# Patient Record
Sex: Female | Born: 1935 | Race: White | Hispanic: No | Smoking: Never smoker
Health system: Southern US, Community
[De-identification: ages and names within clinical notes are randomized; demographics above are authoritative.]

## PROBLEM LIST (undated history)

## (undated) DIAGNOSIS — M1990 Unspecified osteoarthritis, unspecified site: Secondary | ICD-10-CM

## (undated) DIAGNOSIS — L719 Rosacea, unspecified: Secondary | ICD-10-CM

## (undated) DIAGNOSIS — I509 Heart failure, unspecified: Secondary | ICD-10-CM

## (undated) DIAGNOSIS — F329 Major depressive disorder, single episode, unspecified: Secondary | ICD-10-CM

## (undated) DIAGNOSIS — C4491 Basal cell carcinoma of skin, unspecified: Secondary | ICD-10-CM

## (undated) DIAGNOSIS — M4802 Spinal stenosis, cervical region: Secondary | ICD-10-CM

## (undated) DIAGNOSIS — F29 Unspecified psychosis not due to a substance or known physiological condition: Secondary | ICD-10-CM

## (undated) DIAGNOSIS — I4891 Unspecified atrial fibrillation: Secondary | ICD-10-CM

## (undated) DIAGNOSIS — F32A Depression, unspecified: Secondary | ICD-10-CM

## (undated) DIAGNOSIS — I10 Essential (primary) hypertension: Secondary | ICD-10-CM

## (undated) HISTORY — DX: Major depressive disorder, single episode, unspecified: F32.9

## (undated) HISTORY — DX: Rosacea, unspecified: L71.9

## (undated) HISTORY — DX: Basal cell carcinoma of skin, unspecified: C44.91

## (undated) HISTORY — DX: Essential (primary) hypertension: I10

## (undated) HISTORY — PX: CATARACT EXTRACTION: SUR2

## (undated) HISTORY — DX: Depression, unspecified: F32.A

## (undated) HISTORY — DX: Unspecified osteoarthritis, unspecified site: M19.90

## (undated) HISTORY — PX: ROTATOR CUFF REPAIR: SHX139

## (undated) HISTORY — DX: Spinal stenosis, cervical region: M48.02

## (undated) HISTORY — PX: APPENDECTOMY: SHX54

---

## 1969-08-27 HISTORY — PX: DILATION AND CURETTAGE OF UTERUS: SHX78

## 1989-08-27 DIAGNOSIS — C4491 Basal cell carcinoma of skin, unspecified: Secondary | ICD-10-CM

## 1989-08-27 HISTORY — DX: Basal cell carcinoma of skin, unspecified: C44.91

## 1994-12-27 HISTORY — PX: KNEE ARTHROSCOPY: SUR90

## 2001-01-27 ENCOUNTER — Encounter: Payer: Self-pay | Admitting: Internal Medicine

## 2001-01-27 LAB — CONVERTED CEMR LAB

## 2002-05-07 ENCOUNTER — Encounter: Payer: Self-pay | Admitting: Internal Medicine

## 2004-12-15 ENCOUNTER — Ambulatory Visit: Payer: Self-pay | Admitting: Internal Medicine

## 2004-12-23 ENCOUNTER — Ambulatory Visit: Payer: Self-pay | Admitting: General Practice

## 2005-01-20 ENCOUNTER — Ambulatory Visit: Payer: Self-pay | Admitting: Internal Medicine

## 2005-05-13 ENCOUNTER — Ambulatory Visit: Payer: Self-pay | Admitting: Chiropractic Medicine

## 2005-06-02 ENCOUNTER — Ambulatory Visit: Payer: Self-pay | Admitting: Internal Medicine

## 2005-07-09 ENCOUNTER — Ambulatory Visit: Payer: Self-pay | Admitting: Internal Medicine

## 2005-10-11 ENCOUNTER — Ambulatory Visit: Payer: Self-pay | Admitting: Internal Medicine

## 2005-12-07 ENCOUNTER — Ambulatory Visit: Payer: Self-pay | Admitting: Chiropractic Medicine

## 2006-01-20 ENCOUNTER — Ambulatory Visit: Payer: Self-pay | Admitting: Internal Medicine

## 2006-02-16 ENCOUNTER — Encounter: Payer: Self-pay | Admitting: Internal Medicine

## 2006-04-06 ENCOUNTER — Ambulatory Visit: Payer: Self-pay | Admitting: Internal Medicine

## 2006-07-25 ENCOUNTER — Ambulatory Visit: Payer: Self-pay | Admitting: Internal Medicine

## 2006-09-15 ENCOUNTER — Ambulatory Visit: Payer: Self-pay | Admitting: Internal Medicine

## 2006-09-15 ENCOUNTER — Emergency Department: Payer: Self-pay | Admitting: Emergency Medicine

## 2006-09-22 ENCOUNTER — Emergency Department: Payer: Self-pay | Admitting: Internal Medicine

## 2006-11-10 ENCOUNTER — Encounter: Payer: Self-pay | Admitting: Internal Medicine

## 2007-01-26 ENCOUNTER — Ambulatory Visit: Payer: Self-pay | Admitting: Internal Medicine

## 2007-01-26 LAB — CONVERTED CEMR LAB
Albumin: 3.9 g/dL (ref 3.5–5.2)
BUN: 11 mg/dL (ref 6–23)
Calcium: 9.3 mg/dL (ref 8.4–10.5)
Chloride: 99 meq/L (ref 96–112)
GFR calc Af Amer: 105 mL/min
GFR calc non Af Amer: 87 mL/min
Potassium: 3.6 meq/L (ref 3.5–5.1)

## 2007-07-18 ENCOUNTER — Encounter: Payer: Self-pay | Admitting: Internal Medicine

## 2007-07-20 ENCOUNTER — Encounter: Payer: Self-pay | Admitting: Internal Medicine

## 2007-07-20 DIAGNOSIS — M4802 Spinal stenosis, cervical region: Secondary | ICD-10-CM

## 2007-07-20 DIAGNOSIS — R32 Unspecified urinary incontinence: Secondary | ICD-10-CM

## 2007-07-20 DIAGNOSIS — I10 Essential (primary) hypertension: Secondary | ICD-10-CM | POA: Insufficient documentation

## 2007-07-20 DIAGNOSIS — F329 Major depressive disorder, single episode, unspecified: Secondary | ICD-10-CM

## 2007-07-20 DIAGNOSIS — F3289 Other specified depressive episodes: Secondary | ICD-10-CM | POA: Insufficient documentation

## 2007-07-20 DIAGNOSIS — L719 Rosacea, unspecified: Secondary | ICD-10-CM | POA: Insufficient documentation

## 2007-07-27 ENCOUNTER — Ambulatory Visit: Payer: Self-pay | Admitting: Internal Medicine

## 2007-08-30 ENCOUNTER — Encounter: Payer: Self-pay | Admitting: Internal Medicine

## 2007-09-28 ENCOUNTER — Ambulatory Visit: Payer: Self-pay | Admitting: Internal Medicine

## 2007-09-28 ENCOUNTER — Encounter (INDEPENDENT_AMBULATORY_CARE_PROVIDER_SITE_OTHER): Payer: Self-pay | Admitting: *Deleted

## 2007-09-28 DIAGNOSIS — M79609 Pain in unspecified limb: Secondary | ICD-10-CM

## 2007-10-05 ENCOUNTER — Encounter: Payer: Self-pay | Admitting: Internal Medicine

## 2008-01-04 ENCOUNTER — Telehealth: Payer: Self-pay | Admitting: Internal Medicine

## 2008-01-29 ENCOUNTER — Telehealth (INDEPENDENT_AMBULATORY_CARE_PROVIDER_SITE_OTHER): Payer: Self-pay | Admitting: *Deleted

## 2008-03-04 ENCOUNTER — Encounter: Payer: Self-pay | Admitting: Internal Medicine

## 2008-03-12 ENCOUNTER — Telehealth (INDEPENDENT_AMBULATORY_CARE_PROVIDER_SITE_OTHER): Payer: Self-pay | Admitting: *Deleted

## 2008-03-19 ENCOUNTER — Telehealth (INDEPENDENT_AMBULATORY_CARE_PROVIDER_SITE_OTHER): Payer: Self-pay | Admitting: *Deleted

## 2008-06-20 ENCOUNTER — Encounter: Payer: Self-pay | Admitting: Internal Medicine

## 2008-07-01 ENCOUNTER — Encounter: Payer: Self-pay | Admitting: Internal Medicine

## 2008-07-04 ENCOUNTER — Ambulatory Visit: Payer: Self-pay | Admitting: Internal Medicine

## 2008-07-04 DIAGNOSIS — M1990 Unspecified osteoarthritis, unspecified site: Secondary | ICD-10-CM | POA: Insufficient documentation

## 2008-07-08 LAB — CONVERTED CEMR LAB
BUN: 12 mg/dL (ref 6–23)
Basophils Relative: 2.7 % — ABNORMAL HIGH (ref 0.0–1.0)
CO2: 29 meq/L (ref 19–32)
Chloride: 100 meq/L (ref 96–112)
Creatinine, Ser: 0.8 mg/dL (ref 0.4–1.2)
Eosinophils Absolute: 0.2 10*3/uL (ref 0.0–0.7)
Eosinophils Relative: 2.5 % (ref 0.0–5.0)
GFR calc Af Amer: 90 mL/min
Lymphocytes Relative: 23.1 % (ref 12.0–46.0)
MCV: 88.9 fL (ref 78.0–100.0)
Neutrophils Relative %: 68.5 % (ref 43.0–77.0)
Phosphorus: 4.5 mg/dL (ref 2.3–4.6)
Platelets: 323 10*3/uL (ref 150–400)
Potassium: 3.9 meq/L (ref 3.5–5.1)
RBC: 4.27 M/uL (ref 3.87–5.11)
TSH: 2.42 microintl units/mL (ref 0.35–5.50)
WBC: 9.8 10*3/uL (ref 4.5–10.5)

## 2008-09-17 ENCOUNTER — Telehealth: Payer: Self-pay | Admitting: Internal Medicine

## 2008-10-24 ENCOUNTER — Telehealth: Payer: Self-pay | Admitting: Internal Medicine

## 2008-10-30 ENCOUNTER — Telehealth: Payer: Self-pay | Admitting: Internal Medicine

## 2008-11-07 ENCOUNTER — Ambulatory Visit: Payer: Self-pay | Admitting: Internal Medicine

## 2009-02-19 ENCOUNTER — Telehealth: Payer: Self-pay | Admitting: Internal Medicine

## 2009-03-24 ENCOUNTER — Encounter: Payer: Self-pay | Admitting: Internal Medicine

## 2009-05-08 ENCOUNTER — Ambulatory Visit: Payer: Self-pay | Admitting: Internal Medicine

## 2009-05-13 LAB — CONVERTED CEMR LAB
ALT: 19 units/L (ref 0–35)
AST: 24 units/L (ref 0–37)
Alkaline Phosphatase: 71 units/L (ref 39–117)
Basophils Relative: 0.2 % (ref 0.0–3.0)
Bilirubin, Direct: 0.2 mg/dL (ref 0.0–0.3)
CO2: 30 meq/L (ref 19–32)
Creatinine, Ser: 0.8 mg/dL (ref 0.4–1.2)
Eosinophils Relative: 2.4 % (ref 0.0–5.0)
Glucose, Bld: 77 mg/dL (ref 70–99)
Hemoglobin: 12.6 g/dL (ref 12.0–15.0)
Lymphocytes Relative: 23.6 % (ref 12.0–46.0)
Neutro Abs: 6.3 10*3/uL (ref 1.4–7.7)
Neutrophils Relative %: 72.1 % (ref 43.0–77.0)
Potassium: 3.9 meq/L (ref 3.5–5.1)
RBC: 4.15 M/uL (ref 3.87–5.11)
Sodium: 136 meq/L (ref 135–145)
Total Protein: 6.5 g/dL (ref 6.0–8.3)
WBC: 8.7 10*3/uL (ref 4.5–10.5)

## 2009-07-29 ENCOUNTER — Telehealth: Payer: Self-pay | Admitting: Internal Medicine

## 2009-09-02 ENCOUNTER — Telehealth: Payer: Self-pay | Admitting: Internal Medicine

## 2009-11-13 ENCOUNTER — Telehealth: Payer: Self-pay | Admitting: Internal Medicine

## 2009-11-17 ENCOUNTER — Encounter: Payer: Self-pay | Admitting: Internal Medicine

## 2010-01-09 ENCOUNTER — Telehealth: Payer: Self-pay | Admitting: Internal Medicine

## 2010-02-12 ENCOUNTER — Encounter: Payer: Self-pay | Admitting: Internal Medicine

## 2010-02-26 ENCOUNTER — Ambulatory Visit: Payer: Self-pay | Admitting: Internal Medicine

## 2010-03-04 LAB — CONVERTED CEMR LAB
ALT: 17 units/L (ref 0–35)
AST: 24 units/L (ref 0–37)
Alkaline Phosphatase: 68 units/L (ref 39–117)
Basophils Absolute: 0.1 10*3/uL (ref 0.0–0.1)
Eosinophils Relative: 3.3 % (ref 0.0–5.0)
GFR calc non Af Amer: 102.8 mL/min (ref >60)
HCT: 36.2 % (ref 36.0–46.0)
Hemoglobin: 12 g/dL (ref 12.0–15.0)
Lymphocytes Relative: 25.2 % (ref 12.0–46.0)
Lymphs Abs: 2 10*3/uL (ref 0.7–4.0)
Monocytes Relative: 5.2 % (ref 3.0–12.0)
Neutro Abs: 5.2 10*3/uL (ref 1.4–7.7)
Phosphorus: 4 mg/dL (ref 2.3–4.6)
Platelets: 257 10*3/uL (ref 150.0–400.0)
Potassium: 3.9 meq/L (ref 3.5–5.1)
Total Bilirubin: 0.7 mg/dL (ref 0.3–1.2)
WBC: 8 10*3/uL (ref 4.5–10.5)

## 2010-07-20 ENCOUNTER — Ambulatory Visit: Payer: Self-pay | Admitting: Ophthalmology

## 2010-07-27 HISTORY — PX: CATARACT EXTRACTION: SUR2

## 2010-08-13 ENCOUNTER — Ambulatory Visit: Payer: Self-pay | Admitting: Internal Medicine

## 2010-08-28 ENCOUNTER — Telehealth: Payer: Self-pay | Admitting: Internal Medicine

## 2010-09-29 ENCOUNTER — Telehealth: Payer: Self-pay | Admitting: Internal Medicine

## 2010-09-30 ENCOUNTER — Encounter: Payer: Self-pay | Admitting: Internal Medicine

## 2010-10-21 ENCOUNTER — Telehealth: Payer: Self-pay | Admitting: Family Medicine

## 2010-11-09 ENCOUNTER — Encounter: Payer: Self-pay | Admitting: Internal Medicine

## 2011-01-26 NOTE — Progress Notes (Signed)
Summary: Rx Wellbutrin, Alprazolam  Phone Note Refill Request Call back at [phone] Message from:  [facility] on January 09, 2010 11:01 AM  Refills Requested: Medication #1:  WELLBUTRIN SR 150 MG  TB12 Take one by mouth two times a day   Last Refilled: 10/30/2009  Medication #2:  XANAX 0.25 MG  TABS Take one tablet by mouth twice daily as needed   Last Refilled: 11/03/2009 Received faxed refill request   Method Requested: Telephone to Pharmacy Initial call taken by: Linde Gillis CMA Duncan Dull),  January 09, 2010 11:01 AM  Follow-up for Phone Call        okay wellbutrin for 1 year  xanax #60 x 0 Follow-up by: Cindee Salt MD,  January 09, 2010 1:28 PM  Additional Follow-up for Phone Call Additional follow up Details #1::        Rx called to pharmacy, Rx faxed to pharmacy Additional Follow-up by: DeShannon Katrinka Blazing CMA Duncan Dull),  January 09, 2010 6:10 PM    Prescriptions: XANAX 0.25 MG  TABS (ALPRAZOLAM) Take one tablet by mouth twice daily as needed  #60 x 0   Entered by:   Mervin Hack CMA (AAMA)   Authorized by:   Cindee Salt MD   Signed by:   Mervin Hack CMA (AAMA) on 01/09/2010   Method used:   Telephoned to ...       [facility] [phone]* (retail)       [address] [place], [place]  [address]       Ph: [phone]       Fax: [phone]   RxID:   [id number] [employer] SR 150 MG  TB12 (BUPROPION HCL) Take one by mouth two times a day  #60 x 12   Entered by:   Mervin Hack CMA (AAMA)   Authorized by:   Cindee Salt MD   Signed by:   Mervin Hack CMA (AAMA) on 01/09/2010   Method used:   Electronically to        [facility] [phone]* (retail)       [address] [place], [place]  [address]       Ph: [phone]       Fax: [phone]   RxID:   [id number]

## 2011-01-26 NOTE — Progress Notes (Signed)
Summary: vomitting and nausea   Phone Note Call from Patient Call back at [phone]   Caller: Patient Call For: Cindee Salt MD Summary of Call: Patient has been nauseated and throwing up since sunday. The last time she vomitted was last night. She is feeling some better today. She says that she has been resting alot, drinking plenty of fluids (water, gatorade, sprite, and ice chips).  Patient says she is just a little dizzy today with a slight headache. Patient just wanted to let us know and will call back tomorrow if not feeling better.  Initial call taken by: Melody Comas,  October 21, 2010 3:07 PM  Follow-up for Phone Call        noted. follow up as needed.  Follow-up by: Crawford Givens MD,  October 21, 2010 3:26 PM

## 2011-01-26 NOTE — Miscellaneous (Signed)
Summary: Order for Pneumovax & Zostavax/[facility]  Order for Pneumovax & Zostavax/[facility]   Imported By: Lanelle Bal 10/08/2010 10:51:34  _____________________________________________________________________  External Attachment:    Type:   Image     Comment:   External Document

## 2011-01-26 NOTE — Miscellaneous (Signed)
Summary: Pneumococcal  Clinical Lists Changes  Observations: Added new observation of PNEUMOVAX: Historical (11/03/2010 16:22)      Pneumovax Immunization History:    Pneumovax # 1:  Historical (11/03/2010)  lot# [id number] exp 8/12 given at [facility] pharmacy by P.Felicie Morn, RPh

## 2011-01-26 NOTE — Assessment & Plan Note (Signed)
Summary: 6 M F/U DLO   Vital Signs:  Patient profile:   77 year old female Weight:      146 pounds Temp:     98 degrees F Pulse rate:   72 / minute BP sitting:   148 / 70  History of Present Illness: Doing about the same  Still finds the incontinence is "nerve wracking" using oxybutynin at variable dose depending on circumstances does get dry mouth but generally satisfied with this  ONgoing depression  lots of stress---a lot is financial and issues with neighbors, etc  Fell walking dog  ~10 days ago Needed to call rescue to help her up bruised chest and various other spots discussed safety  cataract done a few weeks ago doing well with this  No chest pain  No SOB No dizziness or syncope  Allergies: 1)  ! Penicillin V Potassium (Penicillin V Potassium) 2)  Demerol (Meperidine Hcl) 3)  Codeine Phosphate (Codeine Phosphate)  Past History:  Past medical, surgical, family and social histories (including risk factors) reviewed for relevance to current acute and chronic problems.  Past Medical History: Reviewed history from 07/04/2008 and no changes required. Depression Hypertension Urinary incontinence Basal cell cancer--forehead    1990's Rosacea Cervical spinal stenosis Osteoarthritis  [facility] Dr Lady Gary  [phone] Dr Rosemarie Ax  [phone]  Past Surgical History: Appendectomy 1950's D & C 1970's Cataract OS Right knee arthroscopy 1996 Left torn rotator cuff  Cardiolite negative, nl LV fx 09/00 Dexa- benign 02/07 Right cataract/IOL  8/11  Family History: Reviewed history from 07/20/2007 and no changes required. Father: Died at age 15, CHF, HTN Mother: Died at 75, Lung cancer, HTN  HBP- Mom, Dad No DM Possible dementia  Social History: Reviewed history from 11/07/2008 and no changes required. Widowed twice Now with a gentleman friend again since 6/09 2 adoptedchildren: One living, one deceased Occupation: Retired, Curator  travel agencies Never Smoked Alcohol use-occ  Review of Systems       weight down some generally sleeps okay  Physical Exam  General:  alert and normal appearance.   Neck:  supple, no masses, no thyromegaly, no carotid bruits, and no cervical lymphadenopathy.   Lungs:  normal respiratory effort, no intercostal retractions, no accessory muscle use, and normal breath sounds.   Heart:  normal rate, regular rhythm, no murmur, and no gallop.   Extremities:  trace edema Neurologic:  alert & oriented X3 and strength normal in all extremities.  Walks with cane Psych:  normally interactive, good eye contact, not anxious appearing, and not depressed appearing.     Impression & Recommendations:  Problem # 1:  HYPERTENSION (ICD-401.9) Assessment Unchanged control is fine no changes needed  The following medications were removed from the medication list:    Dyazide 37.5-25 Mg Caps (Triamterene-hctz) .Marland Kitchen... Take one by mouth once a day Her updated medication list for this problem includes:    Verapamil Hcl Cr 180 Mg Cp24 (Verapamil hcl) .Marland Kitchen... Take one by mouth at bedtime  BP today: 148/70 Prior BP: 152/70 (02/26/2010)  Labs Reviewed: K+: 3.9 (02/26/2010) Creat: : 0.6 (02/26/2010)     Problem # 2:  DEPRESSION (ICD-311) Assessment: Unchanged ongoing stress reasonable control so no changes  Her updated medication list for this problem includes:    Wellbutrin Sr 150 Mg Tb12 (Bupropion hcl) .Marland Kitchen... Take one by mouth two times a day    Xanax 0.25 Mg Tabs (Alprazolam) .Marland Kitchen... Take one tablet by mouth twice daily as needed  Problem #  3:  OSTEOARTHRITIS (ICD-715.90) Assessment: Comment Only discussed safety has reasonable pain control--though getting over fall now  Her updated medication list for this problem includes:    Vicodin 5-500 Mg Tabs (Hydrocodone-acetaminophen) .Marland Kitchen... As needed  Problem # 4:  URINARY INCONTINENCE (ICD-788.30) Assessment: Comment Only happier with the  oxybutynin  Complete Medication List: 1)  Wellbutrin Sr 150 Mg Tb12 (Bupropion hcl) .... Take one by mouth two times a day 2)  Verapamil Hcl Cr 180 Mg Cp24 (Verapamil hcl) .... Take one by mouth at bedtime 3)  Xanax 0.25 Mg Tabs (Alprazolam) .... Take one tablet by mouth twice daily as needed 4)  Vicodin 5-500 Mg Tabs (Hydrocodone-acetaminophen) .... As needed 5)  Co Q-10 150 Mg Caps (Coenzyme q10) .... Take 1 by mouth once daily 6)  B-50 Complex Tabs (B complex-folic acid) .... Take 1 by mouth once daily 7)  Oscal 500/200 D-3 500-200 Mg-unit Tabs (Calcium-vitamin d) .... Take 1 by mouth once daily 8)  Yucca 12.1500mg   .... Take 1 tablet by mouth three times a day as needed arthrithis 9)  Metrogel 1 % Gel (Metronidazole) .... Apply two times a day 10)  Oxybutynin Chloride 15 Mg Xr24h-tab (Oxybutynin chloride) .Marland Kitchen.. 1 daily to two times a day for incontinence  Patient Instructions: 1)  Please schedule a follow-up appointment in 6 months .

## 2011-01-26 NOTE — Progress Notes (Signed)
Summary: xanax   Phone Note Refill Request Message from:  Fax from Pharmacy on August 28, 2010 11:42 AM  Refills Requested: Medication #1:  XANAX 0.25 MG  TABS Take one tablet by mouth twice daily as needed   Last Refilled: 05/28/2010 Refill request from [facility]. Fax is on your desk. [phone]  Initial call taken by: Melody Comas,  August 28, 2010 11:42 AM  Follow-up for Phone Call        okay #60 x 1 Follow-up by: Cindee Salt MD,  August 28, 2010 1:32 PM  Additional Follow-up for Phone Call Additional follow up Details #1::        completed refill request was faxed to [phone]  [facility].Lewanda Rife LPN  August 28, 2010 3:15 PM     Prescriptions: XANAX 0.25 MG  TABS (ALPRAZOLAM) Take one tablet by mouth twice daily as needed  #60 x 1   Entered by:   Lewanda Rife LPN   Authorized by:   Cindee Salt MD   Signed by:   Lewanda Rife LPN on 19/14/7829   Method used:   Historical   RxID:   [id number]  Completed refill request form was faxed to [facility] [phone].Lewanda Rife LPN  August 28, 2010 3:17 PM

## 2011-01-26 NOTE — Assessment & Plan Note (Signed)
Summary: FOLLOW UP/[facility]   Vital Signs:  Patient profile:   77 year old female Weight:      160 pounds BMI:     27.56 Temp:     98 degrees F oral Pulse rate:   60 / minute Pulse rhythm:   regular BP sitting:   152 / 70  (left arm) Cuff size:   regular  Vitals Entered By: Mervin Hack CMA Duncan Dull) (February 26, 2010 3:21 PM) CC: follow-up visit   History of Present Illness: Doing okay  Gentleman friend has been to [place] to see his family Visiting and on business Thinks things will be okay upon his return (gone for 6 weeks now) this continues to be a good relationship  dog took off on porch and spun her around but fortunately she didn't fall  Has noted increased urinary symptoms detrol not helping Now out of it and needs to change due to formulary issues  Occ checks BP at [facility] runs in 140's there Occ headaches--she relates to her glasses No chest pain No SOB No sig edema --or occ a trace  Mood has been off worse with boyfriend away for extended area Doesn't feel that med change is needed  some arthritis pain very rarely uses the hydrocodone  Allergies: 1)  ! Penicillin V Potassium (Penicillin V Potassium) 2)  Demerol (Meperidine Hcl) 3)  Codeine Phosphate (Codeine Phosphate)  Past History:  Past medical, surgical, family and social histories (including risk factors) reviewed for relevance to current acute and chronic problems.  Past Medical History: Reviewed history from 07/04/2008 and no changes required. Depression Hypertension Urinary incontinence Basal cell cancer--forehead    1990's Rosacea Cervical spinal stenosis Osteoarthritis  [facility] Dr Lady Gary  [phone] Dr Rosemarie Ax  [phone]  Past Surgical History: Reviewed history from 07/20/2007 and no changes required. Appendectomy 1950's D & C 1970's Cataract OS Right knee arthroscopy 1996 Left torn rotator cuff  Cardiolite negative, nl LV fx 09/00 Dexa- benign 02/07  Family  History: Reviewed history from 07/20/2007 and no changes required. Father: Died at age 67, CHF, HTN Mother: Died at 26, Lung cancer, HTN  HBP- Mom, Dad No DM Possible dementia  Social History: Reviewed history from 11/07/2008 and no changes required. Widowed twice Now with a gentleman friend again since 6/09 2 adoptedchildren: One living, one deceased Occupation: Retired, Curator travel agencies Never Smoked Alcohol use-occ  Review of Systems       appetite is okay weight is stable sleeps okay in general  Physical Exam  General:  alert and normal appearance.   Neck:  supple, no masses, no thyromegaly, no carotid bruits, and no cervical lymphadenopathy.   Lungs:  normal respiratory effort and normal breath sounds.   Heart:  normal rate, regular rhythm, no murmur, and no gallop.   Abdomen:  soft and non-tender.   Msk:  no joint tenderness and no joint swelling.   Some stiffness Neurologic:  needed help getting up on table Walks stably with cane Psych:  normally interactive, good eye contact, not anxious appearing, and not depressed appearing.     Impression & Recommendations:  Problem # 1:  URINARY INCONTINENCE (ICD-788.30) Assessment Deteriorated will change to oxybutynin ER consider vesicare if that doesn't work  Problem # 2:  HYPERTENSION (ICD-401.9) Assessment: Unchanged  reasonable control due for labs  Her updated medication list for this problem includes:    Dyazide 37.5-25 Mg Caps (Triamterene-hctz) .Marland Kitchen... Take one by mouth once a day    Verapamil  Hcl Cr 180 Mg Cp24 (Verapamil hcl) .Marland Kitchen... Take one by mouth at bedtime  BP today: 152/70 Prior BP: 140/70 (05/08/2009)  Labs Reviewed: K+: 3.9 (05/08/2009) Creat: : 0.8 (05/08/2009)     Orders: Venipuncture (21308) TLB-Renal Function Panel (80069-RENAL) TLB-CBC Platelet - w/Differential (85025-CBCD) TLB-Hepatic/Liver Function Pnl (80076-HEPATIC) TLB-TSH (Thyroid Stimulating Hormone)  (84443-TSH)  Problem # 3:  DEPRESSION (ICD-311) Assessment: Unchanged up and down some but generally stable no changes  Her updated medication list for this problem includes:    Wellbutrin Sr 150 Mg Tb12 (Bupropion hcl) .Marland Kitchen... Take one by mouth two times a day    Xanax 0.25 Mg Tabs (Alprazolam) .Marland Kitchen... Take one tablet by mouth twice daily as needed  Problem # 4:  OSTEOARTHRITIS (ICD-715.90) Assessment: Unchanged needs the meds occ tries to stay active  Her updated medication list for this problem includes:    Vicodin 5-500 Mg Tabs (Hydrocodone-acetaminophen) .Marland Kitchen... As needed  Complete Medication List: 1)  Dyazide 37.5-25 Mg Caps (Triamterene-hctz) .... Take one by mouth once a day 2)  Wellbutrin Sr 150 Mg Tb12 (Bupropion hcl) .... Take one by mouth two times a day 3)  Verapamil Hcl Cr 180 Mg Cp24 (Verapamil hcl) .... Take one by mouth at bedtime 4)  Detrol La 4 Mg Cp24 (Tolterodine tartrate) .... Take one by mouth once a day 5)  Xanax 0.25 Mg Tabs (Alprazolam) .... Take one tablet by mouth twice daily as needed 6)  Vicodin 5-500 Mg Tabs (Hydrocodone-acetaminophen) .... As needed 7)  Co Q-10 150 Mg Caps (Coenzyme q10) .... Take 1 by mouth once daily 8)  B-50 Complex Tabs (B complex-folic acid) .... Take 1 by mouth once daily 9)  Oscal 500/200 D-3 500-200 Mg-unit Tabs (Calcium-vitamin d) .... Take 1 by mouth once daily 10)  Yucca 12.1500mg   .... Take 1 tablet by mouth three times a day as needed arthrithis 11)  Metrogel 1 % Gel (Metronidazole) .... Apply two times a day 12)  Oxybutynin Chloride 10 Mg Xr24h-tab (Oxybutynin chloride) .Marland Kitchen.. 1 tab daily for bladder problems  Patient Instructions: 1)  Please schedule a follow-up appointment in 6 months .  Prescriptions: XANAX 0.25 MG  TABS (ALPRAZOLAM) Take one tablet by mouth twice daily as needed  #60 x 1   Entered and Authorized by:   Cindee Salt MD   Signed by:   Cindee Salt MD on 02/26/2010   Method used:   Print then  Give to Patient   RxID:   [id number] OXYBUTYNIN CHLORIDE 10 MG XR24H-TAB (OXYBUTYNIN CHLORIDE) 1 tab daily for bladder problems  #30 x 12   Entered and Authorized by:   Cindee Salt MD   Signed by:   Cindee Salt MD on 02/26/2010   Method used:   Electronically to        [facility] [phone]* (retail)       [address] [place], [place]  [address]       Ph: [phone]       Fax: [phone]   RxID:   [id number]   Current Allergies (reviewed today): ! PENICILLIN V POTASSIUM (PENICILLIN V POTASSIUM) DEMEROL (MEPERIDINE HCL) CODEINE PHOSPHATE (CODEINE PHOSPHATE)

## 2011-01-26 NOTE — Progress Notes (Signed)
Summary: order form for pneumovax  Phone Note From Pharmacy   Caller: [facility] Summary of Call: Order form to give  pneumovax is on your desk.  Initial call taken by: Lowella Petties CMA,  September 29, 2010 4:58 PM  Follow-up for Phone Call        okay to give pneumovax May offer zostavax as well Follow-up by: Cindee Salt MD,  September 30, 2010 7:48 AM  Additional Follow-up for Phone Call Additional follow up Details #1::        order faxed back to [facility] and scanned Additional Follow-up by: DeShannon Katrinka Blazing CMA Duncan Dull),  September 30, 2010 8:43 AM

## 2011-01-26 NOTE — Medication Information (Signed)
Summary: Formulary Letter Regarding Detrol/[employer]  Formulary Letter Regarding Detrol/[employer]   Imported By: Lanelle Bal 02/20/2010 10:48:11  _____________________________________________________________________  External Attachment:    Type:   Image     Comment:   External Document

## 2011-02-08 ENCOUNTER — Telehealth: Payer: Self-pay | Admitting: Internal Medicine

## 2011-02-17 NOTE — Progress Notes (Signed)
SummaryPrudy Barker  Phone Note Refill Request Message from:  [facility]  on February 08, 2011 5:03 PM  Refills Requested: Medication #1:  XANAX 0.25 MG  TABS Take one tablet by mouth twice daily as needed   Last Refilled: 11/25/2010 Form on your desk    Method Requested: Fax to Local Pharmacy Initial call taken by: Mervin Hack CMA Duncan Dull),  February 08, 2011 5:04 PM  Follow-up for Phone Call        okay #60 x 0  Needs to set up follow up appt Follow-up by: Cindee Salt MD,  February 09, 2011 1:31 PM  Additional Follow-up for Phone Call Additional follow up Details #1::        Rx faxed to pharmacy, Spoke with patient and advised results. OV appt scheduled Additional Follow-up by: Mervin Hack CMA Duncan Dull),  February 09, 2011 2:22 PM    Prescriptions: XANAX 0.25 MG  TABS (ALPRAZOLAM) Take one tablet by mouth twice daily as needed  #60 x 0   Entered by:   Mervin Hack CMA (AAMA)   Authorized by:   Cindee Salt MD   Signed by:   Mervin Hack CMA (AAMA) on 02/09/2011   Method used:   Handwritten   RxID:   [id number]

## 2011-03-08 ENCOUNTER — Ambulatory Visit: Payer: Self-pay | Admitting: Internal Medicine

## 2011-04-07 ENCOUNTER — Other Ambulatory Visit: Payer: Self-pay | Admitting: *Deleted

## 2011-04-07 MED ORDER — BUPROPION HCL ER (SR) 150 MG PO TB12: 150.0000 mg | ORAL_TABLET | Freq: Two times a day (BID) | ORAL | Status: DC

## 2011-04-09 ENCOUNTER — Encounter: Payer: Self-pay | Admitting: Internal Medicine

## 2011-04-12 ENCOUNTER — Ambulatory Visit: Payer: Self-pay | Admitting: Internal Medicine

## 2011-04-29 ENCOUNTER — Other Ambulatory Visit: Payer: Self-pay | Admitting: *Deleted

## 2011-04-29 ENCOUNTER — Ambulatory Visit (INDEPENDENT_AMBULATORY_CARE_PROVIDER_SITE_OTHER): Payer: PRIVATE HEALTH INSURANCE | Admitting: Internal Medicine

## 2011-04-29 ENCOUNTER — Encounter: Payer: Self-pay | Admitting: Internal Medicine

## 2011-04-29 VITALS — BP 138/78 | HR 102 | Temp 98.6°F | Ht 64.0 in | Wt 141.0 lb

## 2011-04-29 DIAGNOSIS — F329 Major depressive disorder, single episode, unspecified: Secondary | ICD-10-CM

## 2011-04-29 DIAGNOSIS — R32 Unspecified urinary incontinence: Secondary | ICD-10-CM

## 2011-04-29 DIAGNOSIS — M4802 Spinal stenosis, cervical region: Secondary | ICD-10-CM

## 2011-04-29 DIAGNOSIS — I10 Essential (primary) hypertension: Secondary | ICD-10-CM

## 2011-04-29 NOTE — Telephone Encounter (Signed)
Fax is on your desk . 

## 2011-04-29 NOTE — Progress Notes (Signed)
Subjective:    Patient ID: Heather Barker, female    DOB: 04-18-1930, 77 y.o.   MRN: [id number]  HPI Has had more trouble walking around--esp in evening Uses cane regularly--at least when out Has been pulled down by dog another time---no major injury  Still with depression Overwhelmed at times Money problems, etc Has not had persistent problems---phone conversations with friends often help, etc  Ongoing intermittent arthritis pain Tries to use the hydrocodone sparingly  Still uses oxybutynin Does help with the incontinence  No chest pain Does get some DOE--like going up steps No sig edema  Current outpatient prescriptions:ALPRAZolam (XANAX) 0.25 MG tablet, Take 0.25 mg by mouth 3 (three) times daily as needed.  , Disp: , Rfl: ;  B Complex-Biotin-FA (B-50 COMPLEX PO), Take by mouth daily.  , Disp: , Rfl: ;  buPROPion (WELLBUTRIN SR) 150 MG 12 hr tablet, Take 1 tablet (150 mg total) by mouth 2 (two) times daily., Disp: 60 tablet, Rfl: 0 calcium-vitamin D (OSCAL WITH D) 500-200 MG-UNIT per tablet, Take 1 tablet by mouth daily.  , Disp: , Rfl: ;  HYDROcodone-acetaminophen (VICODIN) 5-500 MG per tablet, Take 1 tablet by mouth every 8 (eight) hours as needed.  , Disp: , Rfl: ;  oxybutynin (DITROPAN XL) 15 MG 24 hr tablet, Take 15 mg by mouth 2 (two) times daily.  , Disp: , Rfl: ;  verapamil (CALAN-SR) 180 MG CR tablet, Take 180 mg by mouth at bedtime.  , Disp: , Rfl:  YUCCA PO, Take by mouth daily.  , Disp: , Rfl: ;  DISCONTD: Coenzyme Q10 150 MG CAPS, Take by mouth daily.  , Disp: , Rfl: ;  DISCONTD: metroNIDAZOLE (METROGEL) 1 % gel, Apply topically 2 (two) times daily.  , Disp: , Rfl:   Past Medical History  Diagnosis Date  . Depression   . Hypertension   . Urinary incontinence   . Basal cell cancer 1990's    forehead  . Rosacea   . Stenosis, cervical spine   . Osteoarthritis     Past Surgical History  Procedure Date  . Appendectomy   . Dilation and curettage of uterus 1970's   . Cataract extraction     left eye  . Knee arthroscopy 1996    right   . Rotator cuff repair     left torn  . Cataract extraction 07/2010    right, IOL    Family History  Problem Relation Age of Onset  . Cancer Mother     lung  . Hypertension Mother   . Heart disease Father   . Hypertension Father   . Diabetes Neg Hx     History   Social History  . Marital Status: Widowed    Spouse Name: N/A    Number of Children: 2  . Years of Education: N/A   Occupational History  . retired housewife, Agricultural consultant, travel agencies    Social History Main Topics  . Smoking status: Never Smoker   . Smokeless tobacco: Never Used  . Alcohol Use: Yes     occasional  . Drug Use: Not on file  . Sexually Active: Not on file   Other Topics Concern  . Not on file   Social History Narrative   Widowed twiceNow with a gentleman friend again since 6/092 adoptedchildren: One living, one deceased   Review of Systems Eating okay Weight is down 5# since last August Sleeps okay     Objective:   Physical Exam  Constitutional:  She appears well-developed and well-nourished. No distress.  Neck: Normal range of motion. Neck supple. No thyromegaly present.  Cardiovascular: Normal rate, regular rhythm and normal heart sounds.  Exam reveals no gallop.   No murmur heard. Pulmonary/Chest: Effort normal and breath sounds normal. No respiratory distress. She has no wheezes. She has no rales.  Abdominal: Soft. There is no tenderness.  Musculoskeletal: She exhibits no edema and no tenderness.  Lymphadenopathy:    She has no cervical adenopathy.  Psychiatric: She has a normal mood and affect. Her behavior is normal. Judgment and thought content normal.          Assessment & Plan:

## 2011-04-30 ENCOUNTER — Other Ambulatory Visit: Payer: Self-pay | Admitting: *Deleted

## 2011-04-30 LAB — BASIC METABOLIC PANEL
BUN: 17 mg/dL (ref 6–23)
Calcium: 8.7 mg/dL (ref 8.4–10.5)
GFR: 79.22 mL/min (ref >60.00)
Glucose, Bld: 102 mg/dL — ABNORMAL HIGH (ref 70–99)

## 2011-04-30 LAB — CBC WITH DIFFERENTIAL/PLATELET
Basophils Absolute: 0 10*3/uL (ref 0.0–0.1)
Basophils Relative: 0.4 % (ref 0.0–3.0)
Eosinophils Relative: 1.6 % (ref 0.0–5.0)
Hemoglobin: 13.3 g/dL (ref 12.0–15.0)
Lymphocytes Relative: 23.2 % (ref 12.0–46.0)
Monocytes Relative: 6 % (ref 3.0–12.0)
Neutro Abs: 5.4 10*3/uL (ref 1.4–7.7)
RBC: 4.34 Mil/uL (ref 3.87–5.11)
RDW: 14 % (ref 11.5–14.6)
WBC: 7.9 10*3/uL (ref 4.5–10.5)

## 2011-04-30 LAB — HEPATIC FUNCTION PANEL
Albumin: 3.8 g/dL (ref 3.5–5.2)
Total Protein: 6.3 g/dL (ref 6.0–8.3)

## 2011-04-30 MED ORDER — ALPRAZOLAM 0.25 MG PO TABS: 0.2500 mg | ORAL_TABLET | Freq: Two times a day (BID) | ORAL | Status: DC | PRN

## 2011-04-30 MED ORDER — ALPRAZOLAM 0.25 MG PO TABS: 0.2500 mg | ORAL_TABLET | Freq: Three times a day (TID) | ORAL | Status: DC | PRN

## 2011-04-30 NOTE — Telephone Encounter (Signed)
Rx called [facility].

## 2011-04-30 NOTE — Telephone Encounter (Signed)
Okay #60 x 1 

## 2011-06-11 ENCOUNTER — Other Ambulatory Visit: Payer: Self-pay | Admitting: *Deleted

## 2011-06-11 MED ORDER — HYDROCODONE-ACETAMINOPHEN 5-500 MG PO TABS: 1.0000 | ORAL_TABLET | Freq: Three times a day (TID) | ORAL | Status: DC | PRN

## 2011-06-11 NOTE — Telephone Encounter (Signed)
[place] patient, ok to refill?

## 2011-06-11 NOTE — Telephone Encounter (Signed)
rx called into pharmacy, per Dr.Aron's instructions.

## 2011-07-14 ENCOUNTER — Inpatient Hospital Stay: Payer: Self-pay | Admitting: Internal Medicine

## 2011-07-16 ENCOUNTER — Telehealth: Payer: Self-pay | Admitting: Internal Medicine

## 2011-07-16 NOTE — Telephone Encounter (Signed)
Spoke to patient yesterday while in [facility] for rapid atrial fib Home today She is changing doctors to be closer to home---too far for her to drive

## 2011-09-07 ENCOUNTER — Ambulatory Visit: Payer: Self-pay | Admitting: Internal Medicine

## 2011-09-22 ENCOUNTER — Other Ambulatory Visit: Payer: Self-pay | Admitting: *Deleted

## 2011-09-22 NOTE — Telephone Encounter (Signed)
Form on your desk  

## 2011-09-23 MED ORDER — ALPRAZOLAM 0.25 MG PO TABS: 0.2500 mg | ORAL_TABLET | Freq: Two times a day (BID) | ORAL | Status: AC | PRN

## 2011-09-23 NOTE — Telephone Encounter (Signed)
rx faxed to pharmacy manually Spoke with patient and she doesn't have a new physician, she will try [place] station.

## 2011-09-23 NOTE — Telephone Encounter (Signed)
She told me she is changing doctors--too far to drive Find out if she has set up with someone----no Rx If she hasn't yet, okay to refill #60 x 0 but she will need to establish with new doctor

## 2011-10-08 ENCOUNTER — Telehealth: Payer: Self-pay | Admitting: *Deleted

## 2011-10-08 NOTE — Telephone Encounter (Signed)
Spoke with patient-   1. Per pt - Neuro says "no stroke, no dementia" She will have driving test at the [facility]. 2. Patient requesting HIPPA/Privacy form to be mailed her her home so she can complete and mail back.

## 2011-10-11 NOTE — Telephone Encounter (Signed)
Okay to mail her a designated party form so she can designate her wishes

## 2011-10-11 NOTE — Telephone Encounter (Signed)
Form mailed

## 2011-10-11 NOTE — Telephone Encounter (Signed)
Patient is still unsure where she will go for medical care. Patient doesn't want a hippa form she wants a designated party release form mailed to her so that we can't talk with anyone about her medical records. Pt is trying to get her license back and doesn't know until then which physician she will be going to.

## 2011-10-11 NOTE — Telephone Encounter (Signed)
Is she coming here, to the [place] office, or what?

## 2011-10-14 ENCOUNTER — Telehealth: Payer: Self-pay | Admitting: *Deleted

## 2011-10-14 NOTE — Telephone Encounter (Signed)
Okay to fill #1 tube x 3 refills Apply bid prn for rosacea (or however it is on her records)

## 2011-10-14 NOTE — Telephone Encounter (Signed)
Pt is asking for refill on brand metrogel 1%.  She needs this for her rosacea.  Uses [facility].

## 2011-10-15 MED ORDER — METRONIDAZOLE 1 % EX GEL: Freq: Two times a day (BID) | CUTANEOUS | Status: AC

## 2011-11-04 ENCOUNTER — Ambulatory Visit: Payer: PRIVATE HEALTH INSURANCE | Admitting: Internal Medicine

## 2012-02-08 ENCOUNTER — Other Ambulatory Visit: Payer: Self-pay | Admitting: *Deleted

## 2012-02-08 MED ORDER — VERAPAMIL HCL ER 180 MG PO TBCR: 180.0000 mg | EXTENDED_RELEASE_TABLET | Freq: Every day | ORAL | Status: DC

## 2013-02-06 ENCOUNTER — Emergency Department: Payer: Self-pay | Admitting: Emergency Medicine

## 2013-02-06 LAB — CBC
MCH: 29.8 pg (ref 26.0–34.0)
MCHC: 33.1 g/dL (ref 32.0–36.0)
MCV: 90 fL (ref 80–100)
Platelet: 212 10*3/uL (ref 150–440)
RBC: 4.14 10*6/uL (ref 3.80–5.20)
WBC: 11.7 10*3/uL — ABNORMAL HIGH (ref 3.6–11.0)

## 2013-02-06 LAB — URINALYSIS, COMPLETE
Bilirubin,UR: NEGATIVE
Glucose,UR: NEGATIVE mg/dL (ref 0–75)
Leukocyte Esterase: NEGATIVE
Nitrite: NEGATIVE
RBC,UR: 3 /HPF (ref 0–5)
WBC UR: 1 /HPF (ref 0–5)

## 2013-02-06 LAB — BASIC METABOLIC PANEL
BUN: 16 mg/dL (ref 7–18)
Chloride: 105 mmol/L (ref 98–107)
EGFR (African American): >60
EGFR (Non-African Amer.): >60
Osmolality: 283 (ref 275–301)

## 2013-06-01 ENCOUNTER — Other Ambulatory Visit: Payer: Self-pay | Admitting: *Deleted

## 2014-01-09 DIAGNOSIS — I10 Essential (primary) hypertension: Secondary | ICD-10-CM | POA: Diagnosis not present

## 2014-01-09 DIAGNOSIS — I2789 Other specified pulmonary heart diseases: Secondary | ICD-10-CM | POA: Diagnosis not present

## 2014-01-15 DIAGNOSIS — I270 Primary pulmonary hypertension: Secondary | ICD-10-CM | POA: Diagnosis not present

## 2014-03-25 DIAGNOSIS — L259 Unspecified contact dermatitis, unspecified cause: Secondary | ICD-10-CM | POA: Diagnosis not present

## 2014-03-25 DIAGNOSIS — I83009 Varicose veins of unspecified lower extremity with ulcer of unspecified site: Secondary | ICD-10-CM | POA: Diagnosis not present

## 2014-03-25 DIAGNOSIS — R234 Changes in skin texture: Secondary | ICD-10-CM | POA: Diagnosis not present

## 2014-03-25 DIAGNOSIS — I8310 Varicose veins of unspecified lower extremity with inflammation: Secondary | ICD-10-CM | POA: Diagnosis not present

## 2014-04-02 DIAGNOSIS — R234 Changes in skin texture: Secondary | ICD-10-CM | POA: Diagnosis not present

## 2014-04-02 DIAGNOSIS — I8310 Varicose veins of unspecified lower extremity with inflammation: Secondary | ICD-10-CM | POA: Diagnosis not present

## 2014-05-27 ENCOUNTER — Emergency Department: Payer: Self-pay | Admitting: Emergency Medicine

## 2014-05-27 DIAGNOSIS — R209 Unspecified disturbances of skin sensation: Secondary | ICD-10-CM | POA: Diagnosis not present

## 2014-05-27 DIAGNOSIS — L02419 Cutaneous abscess of limb, unspecified: Secondary | ICD-10-CM | POA: Diagnosis not present

## 2014-05-27 DIAGNOSIS — M79609 Pain in unspecified limb: Secondary | ICD-10-CM | POA: Diagnosis not present

## 2014-05-27 DIAGNOSIS — I10 Essential (primary) hypertension: Secondary | ICD-10-CM | POA: Diagnosis not present

## 2014-05-27 DIAGNOSIS — R609 Edema, unspecified: Secondary | ICD-10-CM | POA: Diagnosis not present

## 2014-05-27 DIAGNOSIS — I4891 Unspecified atrial fibrillation: Secondary | ICD-10-CM | POA: Diagnosis not present

## 2014-05-27 DIAGNOSIS — L03119 Cellulitis of unspecified part of limb: Secondary | ICD-10-CM | POA: Diagnosis not present

## 2014-05-27 DIAGNOSIS — Z79899 Other long term (current) drug therapy: Secondary | ICD-10-CM | POA: Diagnosis not present

## 2014-05-27 LAB — COMPREHENSIVE METABOLIC PANEL
ALK PHOS: 87 U/L
AST: 26 U/L (ref 15–37)
Albumin: 2.9 g/dL — ABNORMAL LOW (ref 3.4–5.0)
Anion Gap: 5 — ABNORMAL LOW (ref 7–16)
BUN: 19 mg/dL — ABNORMAL HIGH (ref 7–18)
Bilirubin,Total: 1.3 mg/dL — ABNORMAL HIGH (ref 0.2–1.0)
CHLORIDE: 104 mmol/L (ref 98–107)
CREATININE: 0.65 mg/dL (ref 0.60–1.30)
Calcium, Total: 7.9 mg/dL — ABNORMAL LOW (ref 8.5–10.1)
Co2: 29 mmol/L (ref 21–32)
EGFR (Non-African Amer.): >60
Glucose: 99 mg/dL (ref 65–99)
Osmolality: 278 (ref 275–301)
Potassium: 3.9 mmol/L (ref 3.5–5.1)
SGPT (ALT): 26 U/L (ref 12–78)
Sodium: 138 mmol/L (ref 136–145)
Total Protein: 5.9 g/dL — ABNORMAL LOW (ref 6.4–8.2)

## 2014-05-27 LAB — CBC
HCT: 31.4 % — AB (ref 35.0–47.0)
HGB: 10 g/dL — ABNORMAL LOW (ref 12.0–16.0)
MCH: 27.9 pg (ref 26.0–34.0)
MCHC: 31.8 g/dL — ABNORMAL LOW (ref 32.0–36.0)
MCV: 88 fL (ref 80–100)
Platelet: 196 10*3/uL (ref 150–440)
RBC: 3.58 10*6/uL — AB (ref 3.80–5.20)
RDW: 16.5 % — ABNORMAL HIGH (ref 11.5–14.5)
WBC: 7.8 10*3/uL (ref 3.6–11.0)

## 2014-05-27 LAB — DIGOXIN LEVEL: Digoxin: 0.39 ng/mL

## 2014-06-24 ENCOUNTER — Inpatient Hospital Stay: Payer: Self-pay | Admitting: Otolaryngology

## 2014-06-24 DIAGNOSIS — E860 Dehydration: Secondary | ICD-10-CM | POA: Diagnosis present

## 2014-06-24 DIAGNOSIS — F0390 Unspecified dementia without behavioral disturbance: Secondary | ICD-10-CM | POA: Diagnosis present

## 2014-06-24 DIAGNOSIS — R5381 Other malaise: Secondary | ICD-10-CM | POA: Diagnosis present

## 2014-06-24 DIAGNOSIS — I079 Rheumatic tricuspid valve disease, unspecified: Secondary | ICD-10-CM | POA: Diagnosis present

## 2014-06-24 DIAGNOSIS — R7881 Bacteremia: Secondary | ICD-10-CM | POA: Diagnosis not present

## 2014-06-24 DIAGNOSIS — S0990XA Unspecified injury of head, initial encounter: Secondary | ICD-10-CM | POA: Diagnosis not present

## 2014-06-24 DIAGNOSIS — N179 Acute kidney failure, unspecified: Secondary | ICD-10-CM | POA: Diagnosis not present

## 2014-06-24 DIAGNOSIS — I872 Venous insufficiency (chronic) (peripheral): Secondary | ICD-10-CM | POA: Diagnosis present

## 2014-06-24 DIAGNOSIS — S0993XA Unspecified injury of face, initial encounter: Secondary | ICD-10-CM | POA: Diagnosis not present

## 2014-06-24 DIAGNOSIS — R5383 Other fatigue: Secondary | ICD-10-CM | POA: Diagnosis not present

## 2014-06-24 DIAGNOSIS — J90 Pleural effusion, not elsewhere classified: Secondary | ICD-10-CM | POA: Diagnosis not present

## 2014-06-24 DIAGNOSIS — I270 Primary pulmonary hypertension: Secondary | ICD-10-CM | POA: Diagnosis not present

## 2014-06-24 DIAGNOSIS — I82629 Acute embolism and thrombosis of deep veins of unspecified upper extremity: Secondary | ICD-10-CM | POA: Diagnosis not present

## 2014-06-24 DIAGNOSIS — I82619 Acute embolism and thrombosis of superficial veins of unspecified upper extremity: Secondary | ICD-10-CM | POA: Diagnosis not present

## 2014-06-24 DIAGNOSIS — I4891 Unspecified atrial fibrillation: Secondary | ICD-10-CM | POA: Diagnosis not present

## 2014-06-24 DIAGNOSIS — A419 Sepsis, unspecified organism: Secondary | ICD-10-CM | POA: Diagnosis not present

## 2014-06-24 DIAGNOSIS — T07XXXA Unspecified multiple injuries, initial encounter: Secondary | ICD-10-CM | POA: Diagnosis present

## 2014-06-24 DIAGNOSIS — S79919A Unspecified injury of unspecified hip, initial encounter: Secondary | ICD-10-CM | POA: Diagnosis not present

## 2014-06-24 DIAGNOSIS — N049 Nephrotic syndrome with unspecified morphologic changes: Secondary | ICD-10-CM | POA: Diagnosis not present

## 2014-06-24 DIAGNOSIS — N390 Urinary tract infection, site not specified: Secondary | ICD-10-CM | POA: Diagnosis not present

## 2014-06-24 DIAGNOSIS — I2789 Other specified pulmonary heart diseases: Secondary | ICD-10-CM | POA: Diagnosis present

## 2014-06-24 DIAGNOSIS — J189 Pneumonia, unspecified organism: Secondary | ICD-10-CM | POA: Diagnosis not present

## 2014-06-24 DIAGNOSIS — L98499 Non-pressure chronic ulcer of skin of other sites with unspecified severity: Secondary | ICD-10-CM | POA: Diagnosis present

## 2014-06-24 DIAGNOSIS — T82898A Other specified complication of vascular prosthetic devices, implants and grafts, initial encounter: Secondary | ICD-10-CM | POA: Diagnosis not present

## 2014-06-24 DIAGNOSIS — M6282 Rhabdomyolysis: Secondary | ICD-10-CM | POA: Diagnosis not present

## 2014-06-24 DIAGNOSIS — I059 Rheumatic mitral valve disease, unspecified: Secondary | ICD-10-CM | POA: Diagnosis present

## 2014-06-24 DIAGNOSIS — Z452 Encounter for adjustment and management of vascular access device: Secondary | ICD-10-CM | POA: Diagnosis not present

## 2014-06-24 DIAGNOSIS — B952 Enterococcus as the cause of diseases classified elsewhere: Secondary | ICD-10-CM | POA: Diagnosis present

## 2014-06-24 DIAGNOSIS — R6510 Systemic inflammatory response syndrome (SIRS) of non-infectious origin without acute organ dysfunction: Secondary | ICD-10-CM | POA: Diagnosis not present

## 2014-06-24 DIAGNOSIS — I10 Essential (primary) hypertension: Secondary | ICD-10-CM | POA: Diagnosis present

## 2014-06-24 DIAGNOSIS — L02419 Cutaneous abscess of limb, unspecified: Secondary | ICD-10-CM | POA: Diagnosis not present

## 2014-06-24 DIAGNOSIS — S4980XA Other specified injuries of shoulder and upper arm, unspecified arm, initial encounter: Secondary | ICD-10-CM | POA: Diagnosis not present

## 2014-06-24 DIAGNOSIS — S46909A Unspecified injury of unspecified muscle, fascia and tendon at shoulder and upper arm level, unspecified arm, initial encounter: Secondary | ICD-10-CM | POA: Diagnosis not present

## 2014-06-24 DIAGNOSIS — W010XXA Fall on same level from slipping, tripping and stumbling without subsequent striking against object, initial encounter: Secondary | ICD-10-CM | POA: Diagnosis not present

## 2014-06-24 DIAGNOSIS — I509 Heart failure, unspecified: Secondary | ICD-10-CM | POA: Diagnosis not present

## 2014-06-24 DIAGNOSIS — J811 Chronic pulmonary edema: Secondary | ICD-10-CM | POA: Diagnosis not present

## 2014-06-24 DIAGNOSIS — E43 Unspecified severe protein-calorie malnutrition: Secondary | ICD-10-CM | POA: Diagnosis present

## 2014-06-24 DIAGNOSIS — I7090 Unspecified atherosclerosis: Secondary | ICD-10-CM | POA: Diagnosis present

## 2014-06-24 DIAGNOSIS — L97209 Non-pressure chronic ulcer of unspecified calf with unspecified severity: Secondary | ICD-10-CM | POA: Diagnosis present

## 2014-06-24 DIAGNOSIS — B957 Other staphylococcus as the cause of diseases classified elsewhere: Secondary | ICD-10-CM | POA: Diagnosis present

## 2014-06-24 DIAGNOSIS — S199XXA Unspecified injury of neck, initial encounter: Secondary | ICD-10-CM | POA: Diagnosis not present

## 2014-06-24 LAB — COMPREHENSIVE METABOLIC PANEL
ALK PHOS: 84 U/L
Albumin: 2.7 g/dL — ABNORMAL LOW (ref 3.4–5.0)
Anion Gap: 14 (ref 7–16)
BUN: 45 mg/dL — ABNORMAL HIGH (ref 7–18)
Bilirubin,Total: 2 mg/dL — ABNORMAL HIGH (ref 0.2–1.0)
CHLORIDE: 107 mmol/L (ref 98–107)
Calcium, Total: 8.4 mg/dL — ABNORMAL LOW (ref 8.5–10.1)
Co2: 21 mmol/L (ref 21–32)
Creatinine: 0.83 mg/dL (ref 0.60–1.30)
EGFR (Non-African Amer.): >60
GLUCOSE: 99 mg/dL (ref 65–99)
Osmolality: 295 (ref 275–301)
Potassium: 4.5 mmol/L (ref 3.5–5.1)
SGOT(AST): 115 U/L — ABNORMAL HIGH (ref 15–37)
SGPT (ALT): 76 U/L (ref 12–78)
Sodium: 142 mmol/L (ref 136–145)
TOTAL PROTEIN: 5.6 g/dL — AB (ref 6.4–8.2)

## 2014-06-24 LAB — CBC WITH DIFFERENTIAL/PLATELET
Basophil #: 0 10*3/uL (ref 0.0–0.1)
Basophil %: 0.2 %
EOS PCT: 0.2 %
Eosinophil #: 0 10*3/uL (ref 0.0–0.7)
HCT: 38.8 % (ref 35.0–47.0)
HGB: 11.9 g/dL — AB (ref 12.0–16.0)
LYMPHS PCT: 7.7 %
Lymphocyte #: 1.2 10*3/uL (ref 1.0–3.6)
MCH: 25.9 pg — ABNORMAL LOW (ref 26.0–34.0)
MCHC: 30.7 g/dL — ABNORMAL LOW (ref 32.0–36.0)
MCV: 84 fL (ref 80–100)
Monocyte #: 0.8 x10 3/mm (ref 0.2–0.9)
Monocyte %: 5.6 %
NEUTROS ABS: 13.2 10*3/uL — AB (ref 1.4–6.5)
Neutrophil %: 86.3 %
Platelet: 237 10*3/uL (ref 150–440)
RBC: 4.59 10*6/uL (ref 3.80–5.20)
RDW: 16.7 % — ABNORMAL HIGH (ref 11.5–14.5)
WBC: 15.3 10*3/uL — ABNORMAL HIGH (ref 3.6–11.0)

## 2014-06-24 LAB — URINALYSIS, COMPLETE
Bilirubin,UR: NEGATIVE
Glucose,UR: NEGATIVE mg/dL (ref 0–75)
NITRITE: NEGATIVE
PROTEIN: NEGATIVE
Ph: 5 (ref 4.5–8.0)
RBC,UR: 3 /HPF (ref 0–5)
SPECIFIC GRAVITY: 1.024 (ref 1.003–1.030)
SQUAMOUS EPITHELIAL: NONE SEEN
WBC UR: 14 /HPF (ref 0–5)

## 2014-06-24 LAB — PHOSPHORUS: Phosphorus: 4.3 mg/dL (ref 2.5–4.9)

## 2014-06-24 LAB — MAGNESIUM: Magnesium: 2.1 mg/dL

## 2014-06-24 LAB — LIPASE, BLOOD: Lipase: 200 U/L (ref 73–393)

## 2014-06-24 LAB — PROTIME-INR
INR: 1.3
Prothrombin Time: 16.2 secs — ABNORMAL HIGH (ref 11.5–14.7)

## 2014-06-24 LAB — CK TOTAL AND CKMB (NOT AT ARMC)
CK, Total: 988 U/L — ABNORMAL HIGH
CK-MB: 44.4 ng/mL — AB (ref 0.5–3.6)

## 2014-06-24 LAB — APTT: ACTIVATED PTT: 29.9 s (ref 23.6–35.9)

## 2014-06-24 LAB — TROPONIN I: Troponin-I: 0.03 ng/mL

## 2014-06-24 LAB — ETHANOL: Ethanol: <3 mg/dL

## 2014-06-24 LAB — DIGOXIN LEVEL: Digoxin: 0.15 ng/mL

## 2014-06-25 LAB — CBC WITH DIFFERENTIAL/PLATELET
Basophil #: 0.1 10*3/uL (ref 0.0–0.1)
Basophil %: 0.4 %
Eosinophil #: 0 10*3/uL (ref 0.0–0.7)
Eosinophil %: 0.1 %
HCT: 37.8 % (ref 35.0–47.0)
HGB: 11.7 g/dL — ABNORMAL LOW (ref 12.0–16.0)
Lymphocyte #: 1.6 10*3/uL (ref 1.0–3.6)
Lymphocyte %: 9.6 %
MCH: 26.3 pg (ref 26.0–34.0)
MCHC: 30.9 g/dL — ABNORMAL LOW (ref 32.0–36.0)
MCV: 85 fL (ref 80–100)
MONOS PCT: 7.6 %
Monocyte #: 1.3 x10 3/mm — ABNORMAL HIGH (ref 0.2–0.9)
NEUTROS ABS: 13.8 10*3/uL — AB (ref 1.4–6.5)
Neutrophil %: 82.3 %
Platelet: 223 10*3/uL (ref 150–440)
RBC: 4.44 10*6/uL (ref 3.80–5.20)
RDW: 16.8 % — ABNORMAL HIGH (ref 11.5–14.5)
WBC: 16.8 10*3/uL — ABNORMAL HIGH (ref 3.6–11.0)

## 2014-06-25 LAB — CK TOTAL AND CKMB (NOT AT ARMC)
CK, TOTAL: 680 U/L — AB
CK-MB: 26.5 ng/mL — ABNORMAL HIGH (ref 0.5–3.6)

## 2014-06-25 LAB — BASIC METABOLIC PANEL
Anion Gap: 13 (ref 7–16)
BUN: 42 mg/dL — ABNORMAL HIGH (ref 7–18)
CREATININE: 0.88 mg/dL (ref 0.60–1.30)
Calcium, Total: 7.9 mg/dL — ABNORMAL LOW (ref 8.5–10.1)
Chloride: 111 mmol/L — ABNORMAL HIGH (ref 98–107)
Co2: 18 mmol/L — ABNORMAL LOW (ref 21–32)
EGFR (Non-African Amer.): >60
Glucose: 87 mg/dL (ref 65–99)
OSMOLALITY: 293 (ref 275–301)
POTASSIUM: 4.4 mmol/L (ref 3.5–5.1)
Sodium: 142 mmol/L (ref 136–145)

## 2014-06-25 LAB — DIGOXIN LEVEL
DIGOXIN: 1.32 ng/mL
Digoxin: >5 ng/mL

## 2014-06-25 LAB — MAGNESIUM: Magnesium: 2 mg/dL

## 2014-06-25 LAB — TROPONIN I
TROPONIN-I: 0.04 ng/mL
TROPONIN-I: 0.04 ng/mL

## 2014-06-25 LAB — CK-MB: CK-MB: 35.5 ng/mL — ABNORMAL HIGH (ref 0.5–3.6)

## 2014-06-27 LAB — BASIC METABOLIC PANEL
ANION GAP: 9 (ref 7–16)
BUN: 22 mg/dL — ABNORMAL HIGH (ref 7–18)
CHLORIDE: 103 mmol/L (ref 98–107)
Calcium, Total: 7.5 mg/dL — ABNORMAL LOW (ref 8.5–10.1)
Co2: 23 mmol/L (ref 21–32)
Creatinine: 0.7 mg/dL (ref 0.60–1.30)
Glucose: 111 mg/dL — ABNORMAL HIGH (ref 65–99)
Osmolality: 274 (ref 275–301)
POTASSIUM: 3.3 mmol/L — AB (ref 3.5–5.1)
Sodium: 135 mmol/L — ABNORMAL LOW (ref 136–145)

## 2014-06-27 LAB — URINE CULTURE

## 2014-06-28 LAB — BASIC METABOLIC PANEL
Anion Gap: 9 (ref 7–16)
BUN: 15 mg/dL (ref 7–18)
CALCIUM: 7.3 mg/dL — AB (ref 8.5–10.1)
Chloride: 102 mmol/L (ref 98–107)
Co2: 22 mmol/L (ref 21–32)
Creatinine: 0.55 mg/dL — ABNORMAL LOW (ref 0.60–1.30)
EGFR (African American): >60
GLUCOSE: 87 mg/dL (ref 65–99)
OSMOLALITY: 267 (ref 275–301)
POTASSIUM: 3.5 mmol/L (ref 3.5–5.1)
Sodium: 133 mmol/L — ABNORMAL LOW (ref 136–145)

## 2014-06-28 LAB — VANCOMYCIN, TROUGH: Vancomycin, Trough: 6 ug/mL — ABNORMAL LOW (ref 10–20)

## 2014-06-29 DIAGNOSIS — J811 Chronic pulmonary edema: Secondary | ICD-10-CM | POA: Diagnosis not present

## 2014-06-29 DIAGNOSIS — I509 Heart failure, unspecified: Secondary | ICD-10-CM | POA: Diagnosis not present

## 2014-06-29 DIAGNOSIS — I10 Essential (primary) hypertension: Secondary | ICD-10-CM | POA: Diagnosis not present

## 2014-06-29 LAB — BASIC METABOLIC PANEL
ANION GAP: 7 (ref 7–16)
Anion Gap: 5 — ABNORMAL LOW (ref 7–16)
BUN: 14 mg/dL (ref 7–18)
BUN: 15 mg/dL (ref 7–18)
CHLORIDE: 99 mmol/L (ref 98–107)
CREATININE: 0.51 mg/dL — AB (ref 0.60–1.30)
Calcium, Total: 7.6 mg/dL — ABNORMAL LOW (ref 8.5–10.1)
Calcium, Total: 7.6 mg/dL — ABNORMAL LOW (ref 8.5–10.1)
Chloride: 99 mmol/L (ref 98–107)
Co2: 27 mmol/L (ref 21–32)
Co2: 27 mmol/L (ref 21–32)
Creatinine: 0.51 mg/dL — ABNORMAL LOW (ref 0.60–1.30)
EGFR (Non-African Amer.): >60
GLUCOSE: 116 mg/dL — AB (ref 65–99)
Glucose: 112 mg/dL — ABNORMAL HIGH (ref 65–99)
Osmolality: 268 (ref 275–301)
Osmolality: 264 (ref 275–301)
Potassium: 3.7 mmol/L (ref 3.5–5.1)
Potassium: 3.9 mmol/L (ref 3.5–5.1)
Sodium: 133 mmol/L — ABNORMAL LOW (ref 136–145)
Sodium: 131 mmol/L — ABNORMAL LOW (ref 136–145)

## 2014-06-29 LAB — HEMOGLOBIN: HGB: 10.2 g/dL — AB (ref 12.0–16.0)

## 2014-06-29 LAB — TROPONIN I: Troponin-I: <0.02 ng/mL

## 2014-06-29 LAB — PRO B NATRIURETIC PEPTIDE: B-Type Natriuretic Peptide: 1765 pg/mL — ABNORMAL HIGH (ref 0–450)

## 2014-06-29 LAB — CBC WITH DIFFERENTIAL/PLATELET
BASOS ABS: 0.1 10*3/uL (ref 0.0–0.1)
Basophil %: 0.4 %
EOS ABS: 0.2 10*3/uL (ref 0.0–0.7)
Eosinophil %: 1.3 %
HCT: 32.5 % — ABNORMAL LOW (ref 35.0–47.0)
HGB: 10.6 g/dL — ABNORMAL LOW (ref 12.0–16.0)
LYMPHS ABS: 0.8 10*3/uL — AB (ref 1.0–3.6)
Lymphocyte %: 4.7 %
MCH: 26.6 pg (ref 26.0–34.0)
MCHC: 32.5 g/dL (ref 32.0–36.0)
MCV: 82 fL (ref 80–100)
Monocyte #: 1.6 x10 3/mm — ABNORMAL HIGH (ref 0.2–0.9)
Monocyte %: 9.7 %
NEUTROS PCT: 83.9 %
Neutrophil #: 13.9 10*3/uL — ABNORMAL HIGH (ref 1.4–6.5)
Platelet: 217 10*3/uL (ref 150–440)
RBC: 3.99 10*6/uL (ref 3.80–5.20)
RDW: 16.8 % — ABNORMAL HIGH (ref 11.5–14.5)
WBC: 16.6 10*3/uL — AB (ref 3.6–11.0)

## 2014-06-29 LAB — DIGOXIN LEVEL: Digoxin: 0.27 ng/mL

## 2014-06-30 ENCOUNTER — Inpatient Hospital Stay: Payer: Self-pay | Admitting: Internal Medicine

## 2014-06-30 DIAGNOSIS — L97909 Non-pressure chronic ulcer of unspecified part of unspecified lower leg with unspecified severity: Secondary | ICD-10-CM | POA: Diagnosis present

## 2014-06-30 DIAGNOSIS — A419 Sepsis, unspecified organism: Secondary | ICD-10-CM | POA: Diagnosis not present

## 2014-06-30 DIAGNOSIS — Z6825 Body mass index (BMI) 25.0-25.9, adult: Secondary | ICD-10-CM | POA: Diagnosis not present

## 2014-06-30 DIAGNOSIS — R7881 Bacteremia: Secondary | ICD-10-CM | POA: Diagnosis not present

## 2014-06-30 DIAGNOSIS — G3184 Mild cognitive impairment, so stated: Secondary | ICD-10-CM | POA: Diagnosis not present

## 2014-06-30 DIAGNOSIS — I4891 Unspecified atrial fibrillation: Secondary | ICD-10-CM | POA: Diagnosis not present

## 2014-06-30 DIAGNOSIS — S301XXA Contusion of abdominal wall, initial encounter: Secondary | ICD-10-CM | POA: Diagnosis present

## 2014-06-30 DIAGNOSIS — J9819 Other pulmonary collapse: Secondary | ICD-10-CM | POA: Diagnosis present

## 2014-06-30 DIAGNOSIS — Z66 Do not resuscitate: Secondary | ICD-10-CM | POA: Diagnosis not present

## 2014-06-30 DIAGNOSIS — N390 Urinary tract infection, site not specified: Secondary | ICD-10-CM | POA: Diagnosis present

## 2014-06-30 DIAGNOSIS — L98499 Non-pressure chronic ulcer of skin of other sites with unspecified severity: Secondary | ICD-10-CM | POA: Diagnosis present

## 2014-06-30 DIAGNOSIS — I739 Peripheral vascular disease, unspecified: Secondary | ICD-10-CM | POA: Diagnosis present

## 2014-06-30 DIAGNOSIS — I82409 Acute embolism and thrombosis of unspecified deep veins of unspecified lower extremity: Secondary | ICD-10-CM | POA: Diagnosis not present

## 2014-06-30 DIAGNOSIS — I509 Heart failure, unspecified: Secondary | ICD-10-CM | POA: Diagnosis not present

## 2014-06-30 DIAGNOSIS — J189 Pneumonia, unspecified organism: Secondary | ICD-10-CM | POA: Diagnosis not present

## 2014-06-30 DIAGNOSIS — J019 Acute sinusitis, unspecified: Secondary | ICD-10-CM | POA: Diagnosis not present

## 2014-06-30 DIAGNOSIS — I5031 Acute diastolic (congestive) heart failure: Secondary | ICD-10-CM | POA: Diagnosis present

## 2014-06-30 DIAGNOSIS — F39 Unspecified mood [affective] disorder: Secondary | ICD-10-CM | POA: Diagnosis not present

## 2014-06-30 DIAGNOSIS — M6282 Rhabdomyolysis: Secondary | ICD-10-CM | POA: Diagnosis not present

## 2014-06-30 DIAGNOSIS — R609 Edema, unspecified: Secondary | ICD-10-CM | POA: Diagnosis not present

## 2014-06-30 DIAGNOSIS — T82898A Other specified complication of vascular prosthetic devices, implants and grafts, initial encounter: Secondary | ICD-10-CM | POA: Diagnosis not present

## 2014-06-30 DIAGNOSIS — R5381 Other malaise: Secondary | ICD-10-CM | POA: Diagnosis not present

## 2014-06-30 DIAGNOSIS — J9620 Acute and chronic respiratory failure, unspecified whether with hypoxia or hypercapnia: Secondary | ICD-10-CM | POA: Diagnosis not present

## 2014-06-30 DIAGNOSIS — IMO0002 Reserved for concepts with insufficient information to code with codable children: Secondary | ICD-10-CM | POA: Diagnosis not present

## 2014-06-30 DIAGNOSIS — S40019A Contusion of unspecified shoulder, initial encounter: Secondary | ICD-10-CM | POA: Diagnosis present

## 2014-06-30 DIAGNOSIS — F0390 Unspecified dementia without behavioral disturbance: Secondary | ICD-10-CM | POA: Diagnosis present

## 2014-06-30 DIAGNOSIS — J9600 Acute respiratory failure, unspecified whether with hypoxia or hypercapnia: Secondary | ICD-10-CM | POA: Diagnosis not present

## 2014-06-30 DIAGNOSIS — I82629 Acute embolism and thrombosis of deep veins of unspecified upper extremity: Secondary | ICD-10-CM | POA: Diagnosis present

## 2014-06-30 DIAGNOSIS — E43 Unspecified severe protein-calorie malnutrition: Secondary | ICD-10-CM | POA: Diagnosis not present

## 2014-06-30 DIAGNOSIS — I10 Essential (primary) hypertension: Secondary | ICD-10-CM | POA: Diagnosis not present

## 2014-06-30 LAB — TROPONIN I: Troponin-I: <0.02 ng/mL

## 2014-06-30 LAB — CK-MB
CK-MB: 1.5 ng/mL (ref 0.5–3.6)
CK-MB: 1.2 ng/mL (ref 0.5–3.6)
CK-MB: 1.6 ng/mL (ref 0.5–3.6)

## 2014-06-30 LAB — CULTURE, BLOOD (SINGLE)

## 2014-06-30 LAB — VANCOMYCIN, TROUGH: VANCOMYCIN, TROUGH: 6 ug/mL — AB (ref 10–20)

## 2014-07-01 LAB — CULTURE, BLOOD (SINGLE)

## 2014-07-01 LAB — BASIC METABOLIC PANEL
ANION GAP: 7 (ref 7–16)
BUN: 12 mg/dL (ref 7–18)
CO2: 31 mmol/L (ref 21–32)
CREATININE: 0.55 mg/dL — AB (ref 0.60–1.30)
Calcium, Total: 7.5 mg/dL — ABNORMAL LOW (ref 8.5–10.1)
Chloride: 95 mmol/L — ABNORMAL LOW (ref 98–107)
EGFR (African American): >60
EGFR (Non-African Amer.): >60
Glucose: 94 mg/dL (ref 65–99)
Osmolality: 266 (ref 275–301)
POTASSIUM: 3.4 mmol/L — AB (ref 3.5–5.1)
Sodium: 133 mmol/L — ABNORMAL LOW (ref 136–145)

## 2014-07-01 LAB — VANCOMYCIN, TROUGH: VANCOMYCIN, TROUGH: 11 ug/mL (ref 10–20)

## 2014-07-01 LAB — CBC WITH DIFFERENTIAL/PLATELET
BASOS PCT: 0.7 %
Basophil #: 0.1 10*3/uL (ref 0.0–0.1)
EOS ABS: 0.3 10*3/uL (ref 0.0–0.7)
Eosinophil %: 2.1 %
HCT: 30.4 % — AB (ref 35.0–47.0)
HGB: 9.6 g/dL — ABNORMAL LOW (ref 12.0–16.0)
LYMPHS PCT: 6.7 %
Lymphocyte #: 0.8 10*3/uL — ABNORMAL LOW (ref 1.0–3.6)
MCH: 25.7 pg — ABNORMAL LOW (ref 26.0–34.0)
MCHC: 31.7 g/dL — AB (ref 32.0–36.0)
MCV: 81 fL (ref 80–100)
Monocyte #: 1.4 x10 3/mm — ABNORMAL HIGH (ref 0.2–0.9)
Monocyte %: 11.4 %
NEUTROS ABS: 9.9 10*3/uL — AB (ref 1.4–6.5)
NEUTROS PCT: 79.1 %
Platelet: 237 10*3/uL (ref 150–440)
RBC: 3.76 10*6/uL — AB (ref 3.80–5.20)
RDW: 16.4 % — AB (ref 11.5–14.5)
WBC: 12.5 10*3/uL — AB (ref 3.6–11.0)

## 2014-07-02 DIAGNOSIS — R5383 Other fatigue: Secondary | ICD-10-CM | POA: Diagnosis not present

## 2014-07-02 DIAGNOSIS — I5033 Acute on chronic diastolic (congestive) heart failure: Secondary | ICD-10-CM | POA: Diagnosis present

## 2014-07-02 DIAGNOSIS — I10 Essential (primary) hypertension: Secondary | ICD-10-CM | POA: Diagnosis not present

## 2014-07-02 DIAGNOSIS — J189 Pneumonia, unspecified organism: Secondary | ICD-10-CM | POA: Diagnosis not present

## 2014-07-02 DIAGNOSIS — E7800 Pure hypercholesterolemia, unspecified: Secondary | ICD-10-CM | POA: Diagnosis not present

## 2014-07-02 DIAGNOSIS — D649 Anemia, unspecified: Secondary | ICD-10-CM | POA: Diagnosis not present

## 2014-07-02 DIAGNOSIS — R5381 Other malaise: Secondary | ICD-10-CM | POA: Diagnosis not present

## 2014-07-02 DIAGNOSIS — J9620 Acute and chronic respiratory failure, unspecified whether with hypoxia or hypercapnia: Secondary | ICD-10-CM | POA: Diagnosis present

## 2014-07-02 DIAGNOSIS — N390 Urinary tract infection, site not specified: Secondary | ICD-10-CM | POA: Diagnosis not present

## 2014-07-02 DIAGNOSIS — M25539 Pain in unspecified wrist: Secondary | ICD-10-CM | POA: Diagnosis present

## 2014-07-02 DIAGNOSIS — I509 Heart failure, unspecified: Secondary | ICD-10-CM | POA: Diagnosis not present

## 2014-07-02 DIAGNOSIS — R7881 Bacteremia: Secondary | ICD-10-CM | POA: Diagnosis not present

## 2014-07-02 DIAGNOSIS — Z66 Do not resuscitate: Secondary | ICD-10-CM | POA: Diagnosis present

## 2014-07-02 DIAGNOSIS — I4891 Unspecified atrial fibrillation: Secondary | ICD-10-CM | POA: Diagnosis not present

## 2014-07-02 DIAGNOSIS — M6282 Rhabdomyolysis: Secondary | ICD-10-CM | POA: Diagnosis not present

## 2014-07-02 DIAGNOSIS — J019 Acute sinusitis, unspecified: Secondary | ICD-10-CM | POA: Diagnosis not present

## 2014-07-02 DIAGNOSIS — R0609 Other forms of dyspnea: Secondary | ICD-10-CM | POA: Diagnosis not present

## 2014-07-02 DIAGNOSIS — Z7902 Long term (current) use of antithrombotics/antiplatelets: Secondary | ICD-10-CM | POA: Diagnosis not present

## 2014-07-02 DIAGNOSIS — G3184 Mild cognitive impairment, so stated: Secondary | ICD-10-CM | POA: Diagnosis not present

## 2014-07-02 DIAGNOSIS — Z79899 Other long term (current) drug therapy: Secondary | ICD-10-CM | POA: Diagnosis not present

## 2014-07-02 DIAGNOSIS — J9600 Acute respiratory failure, unspecified whether with hypoxia or hypercapnia: Secondary | ICD-10-CM | POA: Diagnosis not present

## 2014-07-02 DIAGNOSIS — R32 Unspecified urinary incontinence: Secondary | ICD-10-CM | POA: Diagnosis not present

## 2014-07-02 DIAGNOSIS — Z0000 Encounter for general adult medical examination without abnormal findings: Secondary | ICD-10-CM | POA: Diagnosis not present

## 2014-07-02 DIAGNOSIS — R0602 Shortness of breath: Secondary | ICD-10-CM | POA: Diagnosis not present

## 2014-07-02 DIAGNOSIS — F39 Unspecified mood [affective] disorder: Secondary | ICD-10-CM | POA: Diagnosis not present

## 2014-07-02 DIAGNOSIS — A419 Sepsis, unspecified organism: Secondary | ICD-10-CM | POA: Diagnosis not present

## 2014-07-02 DIAGNOSIS — R609 Edema, unspecified: Secondary | ICD-10-CM | POA: Diagnosis not present

## 2014-07-02 LAB — BASIC METABOLIC PANEL
Anion Gap: 6 — ABNORMAL LOW (ref 7–16)
BUN: 11 mg/dL (ref 7–18)
CALCIUM: 7.2 mg/dL — AB (ref 8.5–10.1)
CHLORIDE: 96 mmol/L — AB (ref 98–107)
Co2: 32 mmol/L (ref 21–32)
Creatinine: 0.58 mg/dL — ABNORMAL LOW (ref 0.60–1.30)
EGFR (Non-African Amer.): >60
Glucose: 85 mg/dL (ref 65–99)
OSMOLALITY: 267 (ref 275–301)
POTASSIUM: 3.5 mmol/L (ref 3.5–5.1)
SODIUM: 134 mmol/L — AB (ref 136–145)

## 2014-07-02 LAB — CBC WITH DIFFERENTIAL/PLATELET
Basophil #: 0.1 10*3/uL (ref 0.0–0.1)
Basophil %: 1 %
EOS ABS: 0.3 10*3/uL (ref 0.0–0.7)
Eosinophil %: 2.3 %
HCT: 31.4 % — AB (ref 35.0–47.0)
HGB: 9.9 g/dL — AB (ref 12.0–16.0)
Lymphocyte #: 1 10*3/uL (ref 1.0–3.6)
Lymphocyte %: 7.8 %
MCH: 25.6 pg — ABNORMAL LOW (ref 26.0–34.0)
MCHC: 31.6 g/dL — AB (ref 32.0–36.0)
MCV: 81 fL (ref 80–100)
MONO ABS: 1.3 x10 3/mm — AB (ref 0.2–0.9)
Monocyte %: 10.3 %
NEUTROS PCT: 78.6 %
Neutrophil #: 9.7 10*3/uL — ABNORMAL HIGH (ref 1.4–6.5)
Platelet: 259 10*3/uL (ref 150–440)
RBC: 3.87 10*6/uL (ref 3.80–5.20)
RDW: 16.9 % — ABNORMAL HIGH (ref 11.5–14.5)
WBC: 12.3 10*3/uL — ABNORMAL HIGH (ref 3.6–11.0)

## 2014-07-22 DIAGNOSIS — F39 Unspecified mood [affective] disorder: Secondary | ICD-10-CM | POA: Diagnosis not present

## 2014-07-22 DIAGNOSIS — G3184 Mild cognitive impairment, so stated: Secondary | ICD-10-CM | POA: Diagnosis not present

## 2014-08-16 ENCOUNTER — Inpatient Hospital Stay: Payer: Self-pay | Admitting: Internal Medicine

## 2014-08-16 DIAGNOSIS — A419 Sepsis, unspecified organism: Secondary | ICD-10-CM | POA: Diagnosis not present

## 2014-08-16 DIAGNOSIS — J019 Acute sinusitis, unspecified: Secondary | ICD-10-CM | POA: Diagnosis not present

## 2014-08-16 DIAGNOSIS — J90 Pleural effusion, not elsewhere classified: Secondary | ICD-10-CM | POA: Diagnosis not present

## 2014-08-16 DIAGNOSIS — I509 Heart failure, unspecified: Secondary | ICD-10-CM | POA: Diagnosis not present

## 2014-08-16 DIAGNOSIS — G3184 Mild cognitive impairment, so stated: Secondary | ICD-10-CM | POA: Diagnosis not present

## 2014-08-16 DIAGNOSIS — D649 Anemia, unspecified: Secondary | ICD-10-CM | POA: Diagnosis not present

## 2014-08-16 DIAGNOSIS — N390 Urinary tract infection, site not specified: Secondary | ICD-10-CM | POA: Diagnosis not present

## 2014-08-16 DIAGNOSIS — M6281 Muscle weakness (generalized): Secondary | ICD-10-CM | POA: Diagnosis not present

## 2014-08-16 DIAGNOSIS — R32 Unspecified urinary incontinence: Secondary | ICD-10-CM | POA: Diagnosis not present

## 2014-08-16 DIAGNOSIS — J9819 Other pulmonary collapse: Secondary | ICD-10-CM | POA: Diagnosis not present

## 2014-08-16 DIAGNOSIS — R0602 Shortness of breath: Secondary | ICD-10-CM | POA: Diagnosis not present

## 2014-08-16 DIAGNOSIS — F39 Unspecified mood [affective] disorder: Secondary | ICD-10-CM | POA: Diagnosis not present

## 2014-08-16 DIAGNOSIS — R5383 Other fatigue: Secondary | ICD-10-CM | POA: Diagnosis not present

## 2014-08-16 DIAGNOSIS — M25539 Pain in unspecified wrist: Secondary | ICD-10-CM | POA: Diagnosis not present

## 2014-08-16 DIAGNOSIS — I4891 Unspecified atrial fibrillation: Secondary | ICD-10-CM | POA: Diagnosis not present

## 2014-08-16 DIAGNOSIS — J189 Pneumonia, unspecified organism: Secondary | ICD-10-CM | POA: Diagnosis not present

## 2014-08-16 DIAGNOSIS — Z66 Do not resuscitate: Secondary | ICD-10-CM | POA: Diagnosis not present

## 2014-08-16 DIAGNOSIS — R5381 Other malaise: Secondary | ICD-10-CM | POA: Diagnosis not present

## 2014-08-16 DIAGNOSIS — R609 Edema, unspecified: Secondary | ICD-10-CM | POA: Diagnosis not present

## 2014-08-16 DIAGNOSIS — R0989 Other specified symptoms and signs involving the circulatory and respiratory systems: Secondary | ICD-10-CM | POA: Diagnosis not present

## 2014-08-16 DIAGNOSIS — I5033 Acute on chronic diastolic (congestive) heart failure: Secondary | ICD-10-CM | POA: Diagnosis not present

## 2014-08-16 DIAGNOSIS — M19039 Primary osteoarthritis, unspecified wrist: Secondary | ICD-10-CM | POA: Diagnosis not present

## 2014-08-16 DIAGNOSIS — R7881 Bacteremia: Secondary | ICD-10-CM | POA: Diagnosis not present

## 2014-08-16 DIAGNOSIS — J9620 Acute and chronic respiratory failure, unspecified whether with hypoxia or hypercapnia: Secondary | ICD-10-CM | POA: Diagnosis not present

## 2014-08-16 DIAGNOSIS — I10 Essential (primary) hypertension: Secondary | ICD-10-CM | POA: Diagnosis not present

## 2014-08-16 DIAGNOSIS — Z7902 Long term (current) use of antithrombotics/antiplatelets: Secondary | ICD-10-CM | POA: Diagnosis not present

## 2014-08-16 DIAGNOSIS — I5030 Unspecified diastolic (congestive) heart failure: Secondary | ICD-10-CM | POA: Diagnosis not present

## 2014-08-16 DIAGNOSIS — R600 Localized edema: Secondary | ICD-10-CM | POA: Diagnosis not present

## 2014-08-16 DIAGNOSIS — M6250 Muscle wasting and atrophy, not elsewhere classified, unspecified site: Secondary | ICD-10-CM | POA: Diagnosis not present

## 2014-08-16 DIAGNOSIS — M6282 Rhabdomyolysis: Secondary | ICD-10-CM | POA: Diagnosis not present

## 2014-08-16 DIAGNOSIS — R0609 Other forms of dyspnea: Secondary | ICD-10-CM | POA: Diagnosis not present

## 2014-08-16 LAB — URINALYSIS, COMPLETE
BILIRUBIN, UR: NEGATIVE
Bacteria: NONE SEEN
Glucose,UR: NEGATIVE mg/dL (ref 0–75)
Ketone: NEGATIVE
LEUKOCYTE ESTERASE: NEGATIVE
Nitrite: NEGATIVE
PH: 5 (ref 4.5–8.0)
Protein: 30
RBC,UR: 19 /HPF (ref 0–5)
SPECIFIC GRAVITY: 1.011 (ref 1.003–1.030)
Squamous Epithelial: 1
WBC UR: 4 /HPF (ref 0–5)

## 2014-08-16 LAB — COMPREHENSIVE METABOLIC PANEL
Albumin: 2.9 g/dL — ABNORMAL LOW (ref 3.4–5.0)
Alkaline Phosphatase: 108 U/L
Anion Gap: 9 (ref 7–16)
BILIRUBIN TOTAL: 2 mg/dL — AB (ref 0.2–1.0)
BUN: 15 mg/dL (ref 7–18)
CREATININE: 0.74 mg/dL (ref 0.60–1.30)
Calcium, Total: 7.8 mg/dL — ABNORMAL LOW (ref 8.5–10.1)
Chloride: 102 mmol/L (ref 98–107)
Co2: 29 mmol/L (ref 21–32)
EGFR (African American): >60
EGFR (Non-African Amer.): >60
GLUCOSE: 148 mg/dL — AB (ref 65–99)
OSMOLALITY: 283 (ref 275–301)
Potassium: 3.4 mmol/L — ABNORMAL LOW (ref 3.5–5.1)
SGOT(AST): 16 U/L (ref 15–37)
SGPT (ALT): 12 U/L — ABNORMAL LOW
SODIUM: 140 mmol/L (ref 136–145)
Total Protein: 6.1 g/dL — ABNORMAL LOW (ref 6.4–8.2)

## 2014-08-16 LAB — TROPONIN I
Troponin-I: 0.03 ng/mL
Troponin-I: 0.03 ng/mL

## 2014-08-16 LAB — APTT: Activated PTT: 65.1 secs — ABNORMAL HIGH (ref 23.6–35.9)

## 2014-08-16 LAB — CBC
HCT: 26 % — AB (ref 35.0–47.0)
HGB: 8.2 g/dL — ABNORMAL LOW (ref 12.0–16.0)
MCH: 24.6 pg — ABNORMAL LOW (ref 26.0–34.0)
MCHC: 31.6 g/dL — ABNORMAL LOW (ref 32.0–36.0)
MCV: 78 fL — ABNORMAL LOW (ref 80–100)
Platelet: 277 10*3/uL (ref 150–440)
RBC: 3.34 10*6/uL — ABNORMAL LOW (ref 3.80–5.20)
RDW: 19.5 % — ABNORMAL HIGH (ref 11.5–14.5)
WBC: 10.2 10*3/uL (ref 3.6–11.0)

## 2014-08-16 LAB — PROTIME-INR
INR: 3.7
Prothrombin Time: 35.5 secs — ABNORMAL HIGH (ref 11.5–14.7)

## 2014-08-16 LAB — PRO B NATRIURETIC PEPTIDE: B-TYPE NATIURETIC PEPTID: 5676 pg/mL — AB (ref 0–450)

## 2014-08-17 LAB — CBC WITH DIFFERENTIAL/PLATELET
BASOS PCT: 1 %
Basophil #: 0.1 10*3/uL (ref 0.0–0.1)
EOS ABS: 0.3 10*3/uL (ref 0.0–0.7)
Eosinophil %: 3.1 %
HCT: 25.3 % — ABNORMAL LOW (ref 35.0–47.0)
HGB: 8 g/dL — AB (ref 12.0–16.0)
LYMPHS ABS: 1.4 10*3/uL (ref 1.0–3.6)
Lymphocyte %: 15.4 %
MCH: 24.2 pg — AB (ref 26.0–34.0)
MCHC: 31.4 g/dL — AB (ref 32.0–36.0)
MCV: 77 fL — AB (ref 80–100)
MONO ABS: 0.8 x10 3/mm (ref 0.2–0.9)
MONOS PCT: 8.2 %
Neutrophil #: 6.8 10*3/uL — ABNORMAL HIGH (ref 1.4–6.5)
Neutrophil %: 72.3 %
Platelet: 267 10*3/uL (ref 150–440)
RBC: 3.29 10*6/uL — ABNORMAL LOW (ref 3.80–5.20)
RDW: 19 % — ABNORMAL HIGH (ref 11.5–14.5)
WBC: 9.4 10*3/uL (ref 3.6–11.0)

## 2014-08-17 LAB — BASIC METABOLIC PANEL
ANION GAP: 9 (ref 7–16)
BUN: 18 mg/dL (ref 7–18)
CALCIUM: 8 mg/dL — AB (ref 8.5–10.1)
CHLORIDE: 102 mmol/L (ref 98–107)
CO2: 29 mmol/L (ref 21–32)
Creatinine: 0.72 mg/dL (ref 0.60–1.30)
EGFR (Non-African Amer.): >60
Glucose: 96 mg/dL (ref 65–99)
Osmolality: 281 (ref 275–301)
Potassium: 3.4 mmol/L — ABNORMAL LOW (ref 3.5–5.1)
Sodium: 140 mmol/L (ref 136–145)

## 2014-08-17 LAB — TROPONIN I: TROPONIN-I: 0.03 ng/mL

## 2014-08-19 DIAGNOSIS — M19039 Primary osteoarthritis, unspecified wrist: Secondary | ICD-10-CM | POA: Diagnosis not present

## 2014-08-19 DIAGNOSIS — G3184 Mild cognitive impairment, so stated: Secondary | ICD-10-CM | POA: Diagnosis not present

## 2014-08-19 DIAGNOSIS — I5030 Unspecified diastolic (congestive) heart failure: Secondary | ICD-10-CM | POA: Diagnosis not present

## 2014-08-19 DIAGNOSIS — Z23 Encounter for immunization: Secondary | ICD-10-CM | POA: Diagnosis not present

## 2014-08-19 DIAGNOSIS — Z0000 Encounter for general adult medical examination without abnormal findings: Secondary | ICD-10-CM | POA: Diagnosis not present

## 2014-08-19 DIAGNOSIS — M6281 Muscle weakness (generalized): Secondary | ICD-10-CM | POA: Diagnosis not present

## 2014-08-19 DIAGNOSIS — R32 Unspecified urinary incontinence: Secondary | ICD-10-CM | POA: Diagnosis not present

## 2014-08-19 DIAGNOSIS — I4891 Unspecified atrial fibrillation: Secondary | ICD-10-CM | POA: Diagnosis not present

## 2014-08-19 DIAGNOSIS — I10 Essential (primary) hypertension: Secondary | ICD-10-CM | POA: Diagnosis not present

## 2014-08-19 DIAGNOSIS — G309 Alzheimer's disease, unspecified: Secondary | ICD-10-CM | POA: Diagnosis not present

## 2014-08-19 DIAGNOSIS — I509 Heart failure, unspecified: Secondary | ICD-10-CM | POA: Diagnosis not present

## 2014-08-19 DIAGNOSIS — F0280 Dementia in other diseases classified elsewhere without behavioral disturbance: Secondary | ICD-10-CM | POA: Diagnosis not present

## 2014-08-19 DIAGNOSIS — J019 Acute sinusitis, unspecified: Secondary | ICD-10-CM | POA: Diagnosis not present

## 2014-08-19 DIAGNOSIS — R5383 Other fatigue: Secondary | ICD-10-CM | POA: Diagnosis not present

## 2014-08-19 DIAGNOSIS — F39 Unspecified mood [affective] disorder: Secondary | ICD-10-CM | POA: Diagnosis not present

## 2014-08-19 DIAGNOSIS — R600 Localized edema: Secondary | ICD-10-CM | POA: Diagnosis not present

## 2014-08-19 DIAGNOSIS — A419 Sepsis, unspecified organism: Secondary | ICD-10-CM | POA: Diagnosis not present

## 2014-08-19 DIAGNOSIS — R7881 Bacteremia: Secondary | ICD-10-CM | POA: Diagnosis not present

## 2014-08-19 DIAGNOSIS — M6250 Muscle wasting and atrophy, not elsewhere classified, unspecified site: Secondary | ICD-10-CM | POA: Diagnosis not present

## 2014-08-19 DIAGNOSIS — N390 Urinary tract infection, site not specified: Secondary | ICD-10-CM | POA: Diagnosis not present

## 2014-08-19 DIAGNOSIS — Z79899 Other long term (current) drug therapy: Secondary | ICD-10-CM | POA: Diagnosis not present

## 2014-08-19 DIAGNOSIS — M6282 Rhabdomyolysis: Secondary | ICD-10-CM | POA: Diagnosis not present

## 2014-08-19 DIAGNOSIS — J189 Pneumonia, unspecified organism: Secondary | ICD-10-CM | POA: Diagnosis not present

## 2014-08-19 DIAGNOSIS — R5381 Other malaise: Secondary | ICD-10-CM | POA: Diagnosis not present

## 2014-08-19 DIAGNOSIS — J90 Pleural effusion, not elsewhere classified: Secondary | ICD-10-CM | POA: Diagnosis not present

## 2014-08-19 DIAGNOSIS — J9819 Other pulmonary collapse: Secondary | ICD-10-CM | POA: Diagnosis not present

## 2014-08-19 DIAGNOSIS — M25539 Pain in unspecified wrist: Secondary | ICD-10-CM | POA: Diagnosis not present

## 2014-08-19 DIAGNOSIS — R0602 Shortness of breath: Secondary | ICD-10-CM | POA: Diagnosis not present

## 2014-08-19 DIAGNOSIS — R609 Edema, unspecified: Secondary | ICD-10-CM | POA: Diagnosis not present

## 2014-08-27 DIAGNOSIS — F0280 Dementia in other diseases classified elsewhere without behavioral disturbance: Secondary | ICD-10-CM | POA: Diagnosis not present

## 2014-08-27 DIAGNOSIS — G309 Alzheimer's disease, unspecified: Secondary | ICD-10-CM | POA: Diagnosis not present

## 2014-08-27 DIAGNOSIS — I4891 Unspecified atrial fibrillation: Secondary | ICD-10-CM | POA: Diagnosis not present

## 2014-08-27 DIAGNOSIS — I10 Essential (primary) hypertension: Secondary | ICD-10-CM | POA: Diagnosis not present

## 2014-09-02 DIAGNOSIS — F39 Unspecified mood [affective] disorder: Secondary | ICD-10-CM | POA: Diagnosis not present

## 2014-09-02 DIAGNOSIS — G3184 Mild cognitive impairment, so stated: Secondary | ICD-10-CM | POA: Diagnosis not present

## 2014-09-14 DIAGNOSIS — I4891 Unspecified atrial fibrillation: Secondary | ICD-10-CM | POA: Diagnosis not present

## 2014-10-07 ENCOUNTER — Telehealth: Payer: Self-pay

## 2014-10-07 NOTE — Telephone Encounter (Signed)
Pt last seen in 2012. Spoke with Morey Hummingbird and she checked [employer] and there were no records of living will.  Spoke to pt and notified her we do not have anything on file and that our records go back to 2008.

## 2014-10-07 NOTE — Telephone Encounter (Signed)
Pt left v/m requesting med records for copy of living will; pt request cb.

## 2014-10-22 DIAGNOSIS — I498 Other specified cardiac arrhythmias: Secondary | ICD-10-CM | POA: Diagnosis not present

## 2014-11-25 DIAGNOSIS — F323 Major depressive disorder, single episode, severe with psychotic features: Secondary | ICD-10-CM | POA: Diagnosis not present

## 2014-11-25 DIAGNOSIS — F39 Unspecified mood [affective] disorder: Secondary | ICD-10-CM | POA: Diagnosis not present

## 2014-11-25 DIAGNOSIS — G3184 Mild cognitive impairment, so stated: Secondary | ICD-10-CM | POA: Diagnosis not present

## 2014-11-29 DIAGNOSIS — F419 Anxiety disorder, unspecified: Secondary | ICD-10-CM | POA: Diagnosis not present

## 2014-11-29 DIAGNOSIS — F39 Unspecified mood [affective] disorder: Secondary | ICD-10-CM | POA: Diagnosis not present

## 2014-12-09 DIAGNOSIS — F419 Anxiety disorder, unspecified: Secondary | ICD-10-CM | POA: Diagnosis not present

## 2014-12-09 DIAGNOSIS — F39 Unspecified mood [affective] disorder: Secondary | ICD-10-CM | POA: Diagnosis not present

## 2014-12-16 DIAGNOSIS — F39 Unspecified mood [affective] disorder: Secondary | ICD-10-CM | POA: Diagnosis not present

## 2014-12-16 DIAGNOSIS — F419 Anxiety disorder, unspecified: Secondary | ICD-10-CM | POA: Diagnosis not present

## 2014-12-30 DIAGNOSIS — E1165 Type 2 diabetes mellitus with hyperglycemia: Secondary | ICD-10-CM | POA: Diagnosis not present

## 2014-12-30 DIAGNOSIS — I498 Other specified cardiac arrhythmias: Secondary | ICD-10-CM | POA: Diagnosis not present

## 2014-12-30 DIAGNOSIS — F419 Anxiety disorder, unspecified: Secondary | ICD-10-CM | POA: Diagnosis not present

## 2014-12-30 DIAGNOSIS — Z5181 Encounter for therapeutic drug level monitoring: Secondary | ICD-10-CM | POA: Diagnosis not present

## 2014-12-30 DIAGNOSIS — E612 Magnesium deficiency: Secondary | ICD-10-CM | POA: Diagnosis not present

## 2014-12-30 DIAGNOSIS — D649 Anemia, unspecified: Secondary | ICD-10-CM | POA: Diagnosis not present

## 2014-12-30 DIAGNOSIS — F39 Unspecified mood [affective] disorder: Secondary | ICD-10-CM | POA: Diagnosis not present

## 2015-01-06 DIAGNOSIS — F419 Anxiety disorder, unspecified: Secondary | ICD-10-CM | POA: Diagnosis not present

## 2015-01-06 DIAGNOSIS — F39 Unspecified mood [affective] disorder: Secondary | ICD-10-CM | POA: Diagnosis not present

## 2015-02-07 DIAGNOSIS — I4891 Unspecified atrial fibrillation: Secondary | ICD-10-CM | POA: Diagnosis not present

## 2015-02-07 DIAGNOSIS — G3184 Mild cognitive impairment, so stated: Secondary | ICD-10-CM | POA: Diagnosis not present

## 2015-02-07 DIAGNOSIS — I509 Heart failure, unspecified: Secondary | ICD-10-CM | POA: Diagnosis not present

## 2015-02-17 DIAGNOSIS — F323 Major depressive disorder, single episode, severe with psychotic features: Secondary | ICD-10-CM | POA: Diagnosis not present

## 2015-02-17 DIAGNOSIS — G3184 Mild cognitive impairment, so stated: Secondary | ICD-10-CM | POA: Diagnosis not present

## 2015-02-17 DIAGNOSIS — F39 Unspecified mood [affective] disorder: Secondary | ICD-10-CM | POA: Diagnosis not present

## 2015-02-27 DIAGNOSIS — I509 Heart failure, unspecified: Secondary | ICD-10-CM | POA: Diagnosis not present

## 2015-04-07 DIAGNOSIS — F39 Unspecified mood [affective] disorder: Secondary | ICD-10-CM | POA: Diagnosis not present

## 2015-04-07 DIAGNOSIS — G3184 Mild cognitive impairment, so stated: Secondary | ICD-10-CM | POA: Diagnosis not present

## 2015-04-07 DIAGNOSIS — F323 Major depressive disorder, single episode, severe with psychotic features: Secondary | ICD-10-CM | POA: Diagnosis not present

## 2015-04-19 NOTE — Consult Note (Signed)
PATIENT NAME:  Heather Barker, Heather Barker MR#:  [id number] DATE OF BIRTH:  Oct 09, 1930  INFECTIOUS DISEASE CONSULTATION NOTE  DATE OF CONSULTATION:  06/26/2014  REFERRING PHYSICIAN:     Sheikh A. Elijio Miles, MD CONSULTING PHYSICIAN:  Cheral Marker. Ola Spurr, MD  REASON FOR CONSULTATION: Sepsis and bacteremia.   HISTORY OF PRESENT ILLNESS: This is an 81 year old female with a history of hypertension, who was admitted June 30th from home after she was found down by a neighbor. She was confused on presentation. She was found to be hypothermic and have a leukocytosis. She was also found to have significant sores on her lower extremities. Since admission, the patient had been covered with broad-spectrum antibiotics. She has bacteremia with two blood cultures positive for Gram-positive cocci. She has also been found to have a right upper extremity DVT.   PAST MEDICAL HISTORY: 1.  A. fib.   2.  Hypertension.  3.  Incontinence.   PAST SURGICAL HISTORY: None.    ALLERGIES:  1.  LIDOCAINE. 2.  CODEINE. 3.  DEMEROL.    SOCIAL HISTORY: Currently is negative for tobacco, alcohol or drugs. She lives by herself, but neighbors check in on her.   FAMILY HISTORY: Noncontributory.   ANTIBIOTICS SINCE ADMISSION: Include vancomycin and levofloxacin.    REVIEW OF SYSTEMS: Unable to be obtained due to patient's confusion.   PHYSICAL EXAMINATION: VITAL SIGNS: Temperature 97, pulse 76, blood pressure 132/79, respirations 19, sat 97% on 2 liters.  GENERAL: She is frail. she is confused. She is unable to really have a lucid conversation.   HEENT: Pupils equal, round, reactive to light and accommodation. Extraocular movements are intact. Sclerae are anicteric. Mucous membranes are dry. Oropharynx has no lesions.  NECK: Supple. No anterior cervical, posterior cervical or supraclavicular lymphadenopathy.  HEART: Regular, distant heart sounds.  LUNGS: Clear.  ABDOMEN: Soft, nontender, nondistended.  EXTREMITIES: She has  trace edema bilaterally.  SKIN: She has bilateral lower extremity chronic-appearing ulcers.  NEUROLOGIC: She is confused. She asked to be "plugged in." She is able to move all four extremities.   LABORATORY DATA: Doppler ultrasound right upper extremity shows a DVT in the right brachial and basilic veins. White blood count on admission was 15.3 currently 16.8, hemoglobin 11.7, platelets 233. Blood cultures, two of two, are growing Gram-positive cocci in clusters. Urine culture is growing greater than 100,000 Gram-negative rods. Urinalysis showed 14 white cells. Renal function shows creatinine of 0.88, BUN 42. LFTs showed low albumin at 2.7, T-bili 2.0, AST 115. CPK was elevated at 988, currently is down to 680.  IMAGING: Hip x-ray was negative for any fracture. CT of the C-spine: No acute evidence of infection. There was anasarca and a right pleural effusion. There was sinusitis with right maxillary and left sphenoid effusions. Chest x-ray showed small right pleural effusion with basilar airspace disease.   IMPRESSION: An 81 year old elderly female, lives at home, found down. She has chronic-appearing ulcers on her lower extremity and has Gram-positive cocci in her blood cultures. Clinically, she is stabilized on vancomycin and levofloxacin. She does have a right pleural effusion and potential pneumonia associated with that. She has a urinary tract infection as well.   RECOMMENDATIONS: 1.  Continue vancomycin and levofloxacin.  2.  Await final speciation of Gram-positive cocci.  3. Recommend repeating blood cultures x 2.  4.  Consider echocardiogram if this is Staphylococcus aureus.   Thank you for the consultation. I will be glad to follow with you.   ____________________________ Cheral Marker. Ola Spurr,  MD dpf:aj D: 06/26/2014 20:55:00 ET T: 06/26/2014 23:59:34 ET JOB#: [id number]  cc: Cheral Marker. Ola Spurr, MD, <Dictator> Sheikh A. Elijio Miles, MD Ramsey Midgett Ola Spurr MD ELECTRONICALLY SIGNED  07/15/2014 21:15

## 2015-04-19 NOTE — H&P (Signed)
PATIENT NAME:  Heather Barker, Heather Barker MR#:  [id number] DATE OF BIRTH:  1930/07/09  DATE OF ADMISSION:  06/30/2014  REFERRING PHYSICIAN: Dr. Cinda Quest.   PRIMARY CARE PHYSICIAN: Dr. Lamonte Sakai.   CHIEF COMPLAINT: Bleeding from PICC line.   HISTORY OF PRESENT ILLNESS: This is an 81 year old Caucasian female with past medical history of atrial fibrillation, hypertension, who is presenting for bleeding from her PICC line. She was discharged from [facility] around 2:00 p.m. on 06/29/2014. Discharge diagnosis was sepsis with known bacteremia, discharged on antibiotics with vancomycin and Levaquin, and also a discharge diagnosis of new onset DVT, placed on Xarelto therapy. She arrived at her nursing facility, [facility], noted to be bleeding from the PEG and brought back to the hospital. In the Emergency Department, noted to be hypoxemic, with saturations into the 80s room air, as well as noted to be tachypneic. She is pleasantly confused. Her only complaint is that she would like some chocolate candy.   REVIEW OF SYSTEMS: Question the validity, given her confusion, although she states she has no complaints. CONSTITUTIONAL: No fevers, chills, fatigue.  EYES: No blurred vision, double vision, eye pain. HEAD, EARS, EYES, NOSE, AND THROAT:  No tinnitus, ear pain, hearing loss. RESPIRATORY: No cough, wheeze, shortness of breath.  CARDIOVASCULAR: No chest pain, palpitations, edema.  GASTROINTESTINAL: No nausea, vomiting, diarrhea, abdominal pain.  GENITOURINARY: Denies dysuria, hematuria.  ENDOCRINE: No nocturia or thyroid problems.  HEMATOLOGIC AND LYMPHATIC: No bruising. Positive for bleeding.  SKIN: No rashes, lesions.  MUSCULOSKELETAL: No pain in neck, back, shoulder, knees, hips or arthritic symptoms.  NEUROLOGIC: No paralysis, paresthesias.  PSYCHIATRIC: No anxiety or depressive symptoms.  Otherwise, full review of systems is performed and is negative.  PAST MEDICAL HISTORY: Atrial  fibrillation as well as hypertension, recently diagnosed with sepsis  New diagnosis of deep vein thrombosis.   SOCIAL HISTORY: No alcohol, tobacco or drug use.   FAMILY HISTORY: No documented cardiopulmonary disorders.   ALLERGIES: CODEINE, DEMEROL, AND LIDOCAINE.   HOME MEDICATIONS: Cardizem 240 mg p.o. daily, digoxin 250 mcg p.o. daily, Xarelto 50 mg p.o. b.i.d., metoprolol 25 mg p.o. b.i.d., vancomycin 750 mg IV b.i.d., as well as Levaquin 250 mg IV daily, oxybutynin 15 mg p.o. daily, vitamin B12 1 tablet daily, B6, 1 daily, vitamin D3 at 1 tablet daily, vitamin E 1 tablet daily.   PHYSICAL EXAMINATION: VITAL SIGNS: Temperature 97.6, heart rate 106, respirations 24, saturating 88% on room air. Blood pressure 143/80. Weight 73.5 kg, BMI 29.7.  GENERAL: Chronically ill, frail-appearing, Caucasian female, in minimal distress given respiratory status.  HEAD: Normocephalic, atraumatic.  EYES: Pupils equal, round, reactive to light. Extraocular muscles intact. No scleral icterus.  MOUTH: Moist mucosal membrane. Dentition intact. No abscess noted.  EARS, NOSE, AND THROAT: Clear, without exudates. No external lesions.  NECK: Supple. No thyromegaly. No nodules. No JVD.  PULMONARY: Coarse breath sounds bilaterally with scattered rhonchi, most prominent in the basal lung fields. Good respiratory effort.  CHEST: Nontender to palpation.  CARDIOVASCULAR: S1 and S2. Irregular rate, irregular rhythm. No murmurs, rubs, or gallops. No edema. Pedal pulses 2+ bilaterally.  GASTROINTESTINAL: Soft, nontender, nondistended. No masses. Positive bowel sounds. No hepatosplenomegaly.  MUSCULOSKELETAL: No swelling, clubbing, or edema. Range of motion full in all extremities. Her left upper extremity PICC line in place, with some bleeding which is now controlled.  SKIN: Multiple areas of ecchymosis, though no rashes, lesions, cyanosis. Skin warm, dry. Turgor intact.  PSYCHIATRIC: Mood and affect: She is fairly  confused, somewhat anxious. She is awake, fully conversant, though babbling. Insight and judgment appear to be poor.   LABORATORY DATA: Sodium 131, potassium 3.9, chloride 99, bicarbonate 27, BUN 15, creatinine 0.51, glucose 112. BNP 1765. Troponin less than 0.02. WBC 16.6, hemoglobin 10.6, platelets of 217. Chest x-ray performed, which reveals worsening interstitial infiltrates, concerning for pulmonary edema. PICC line remains in good position. There is increased aeration of the right middle and lower lobes, however, some increased infiltrate over the left lower concerning for either atelectasis or pneumonia.   ASSESSMENT AND PLAN: An 81 year old Caucasian female with history of atrial fibrillation, recently discharged from [facility] with a discharge diagnosis of sepsis as well as deep vein thrombosis, originally presenting for bleeding peripherally inserted central catheter line, however, noted to be hypoxemic with tachypnea.  1.  Acute on chronic respiratory failure, multifactorial, in relation to atelectasis, recent pneumonia, as well as what appears to be diastolic congestive heart failure. Provide supplemental oxygen. She meets respiratory failure criteria by SaO2 as well as inability to talk in complete sentences on arrival. Provide DuoNeb treatments q.4h., incentive spirometry, supplemental oxygen to keep oxygen saturation greater than 92%. Follow the urine output, renal function. Continue antibiotics as previous stated. Gentle diuresis. Received Lasix one dose thus far. Continue with daily diuresis.  2.  Deep venous thrombosis. Continue with Xarelto. 3.  Bleeding peripherally inserted central catheter line. Bleeding controlled in the emergency department with pressure dressing.  4.  Atrial fibrillation. Continue Cardizem.  5.  Deep venous thrombosis prophylaxis with Xarelto.  CODE STATUS: Patient is do not resuscitate.   TIME SPENT: 45 minutes.   ____________________________ Aaron Mose. Hower,  MD dkh:cg D: 06/30/2014 00:25:09 ET T: 06/30/2014 01:38:38 ET JOB#: [id number]  cc: Aaron Mose. Hower, MD, <Dictator> DAVID Woodfin Ganja MD ELECTRONICALLY SIGNED 07/02/2014 1:48

## 2015-04-19 NOTE — Discharge Summary (Signed)
PATIENT NAME:  Heather Barker, Heather Barker MR#:  [id number] DATE OF BIRTH:  1930-02-18  DATE OF ADMISSION:  06/30/2014. DATE OF DISCHARGE:  07/02/2014.  ADMITTING PHYSICIAN:  Aaron Mose. Hower, MD.  DISCHARGE PHYSICIAN:  Sheikh A. Elijio Miles, MD.  DISCHARGE DIAGNOSES:  1.  Need for PICC line.  2.  Acute diastolic congestive heart failure.  3.  Acute respiratory failure.   SECONDARY DIAGNOSES:  1.  Deep vein thrombosis.  2.  Enterococcus bacteremia.  3.  Urinary tract infection,  4.  Chronic atrial fibrillation.   DISCHARGE MEDICATIONS:  1.  Oxybutynin 15 mg daily.  2.  Digoxin 250 mg once daily.  3.  Metoprolol tartrate 25 mg b.i.d.  4.  Cardizem CD 240 mg daily.  5.  Xarelto 15 mg daily x 14 days.  6.  Xarelto 20 mg once a day, start after the 15-mg dosing is complete. 7.  Vancomycin 50 mg every 12 hours, stop date 07/03/2014.  8.  Levaquin 250 mg IV q.24 hours, stop on 07/03/2014.  9.  Refer to medication reconciliation.  10. Vitamin E supplement daily.  11. Vitamin D3 daily.  12. Vitamin B12 daily.  13. Vitamin B6 daily.   HOSPITAL COURSE:  This lady was admitted through the Emergency Room after she was sent back home from a nursing home where she was observed to have a bleeding PICC line. On arrival to the Emergency Room she was found to be acute respiratory failure and congestive heart failure, clinically and radiologically. Please refer to the history and physical for details. The patient was admitted to the medical floor and was placed on intravenous diuretics for day or two. Her oxygen requirements improved to room air oxygen. She did not exhibit any further bleeding from her PICC line during her hospitalization. Her anticoagulation was continued for her deep vein thrombosis and chronic atrial fibrillation with Xarelto. Rate control was maintained with digoxin, metoprolol and Cardizem. The patient'Heather Barker mental status remained stable at baseline. She does have a history of dementia. The  patient'Heather Barker code status remained DO NOT RESUSCITATE.  The patient underwent echocardiogram, which revealed normal ejection fraction and some diastolic dysfunction.   IMAGING:  1.  Chest x-ray.  2.  A 2-D echocardiogram.   CONSULTATIONS:  Infectious disease, Dr. Ola Spurr.   PROCEDURES: None.   DISCHARGE RECOMMENDATIONS:  Dr. Ola Spurr recommended that the patient receive a total of 10 days of antibiotics, which means that vancomycin and Levaquin should be completed on 07/03/2014.   DISCHARGE FOLLOWUP: Follow up with Dr. Ubaldo Glassing in 1 to 2 weeks for atrial fibrillation. Follow up with Dr. Ola Spurr in 1 to 2 weeks for Enterococcus bacteremia.   DISCHARGE INSTRUCTIONS:  1.  Diet: Low sodium, continue nutritional supplements.  2.  Activity:  As tolerated.   Discharge time spent:  35 minutes.   ____________________________ Venetia Maxon Elijio Miles, MD sat:lt D: 07/02/2014 13:17:58 ET T: 07/02/2014 15:17:29 ET JOB#: [id number]  cc: Sheikh A. Elijio Miles, MD, <Dictator> Veverly Fells MD ELECTRONICALLY SIGNED 07/05/2014 13:38

## 2015-04-19 NOTE — H&P (Signed)
PATIENT NAME:  Heather Barker, Heather Barker MR#:  [id number] DATE OF BIRTH:  [birth year]  DATE OF ADMISSION:  06/24/2014  REFERRING PHYSICIAN: Dr. Hinda Kehr  PRIMARY CARE PHYSICIAN: Dr. Lamonte Sakai   PRIMARY CARDIOLOGIST: Dr. Ubaldo Glassing   CHIEF COMPLAINT: Fall.   HISTORY OF PRESENT ILLNESS: This is an 81 year old female who lives at home by herself, with known history of atherosclerosis, hypertension. The patient was found on the floor by the maintenance, at home. The patient lives at home by herself, was found on the floor by maintenance, so they called EMS. The patient was initially confused upon presentation. The patient was found to be hypothermic, has significant leukocytosis at 15,000. As well, she had elevated total CK at 988. The patient cannot recall exactly how many hours she has been on the floor, reports she has been on the floor since the afternoon. When asked why, she says that she was trying to catch her cat. Unclear if this is true or not, but patient's neighbor is at bedside, reports she does have a cat. The patient's work-up was significant for pneumonia on the chest x-ray and urinary tract infection. As well, her total CK was elevated, but creatinine was within normal limits. The patient was found to have bilateral lower extremity erythema and chronic wounds, which neighbor reports she was supposed to follow with a dermatology doctor as an outpatient for it. The patient has multiple bruises, mainly on the right side. Right shoulder x-ray was done, did not show any acute fracture. As well, the patient had significant skin bruise on the right hip area and right flank area. The patient's CT head did not show any acute findings besides sinusitis of the right maxillary and left sphenoid effusion. As well, the patient is known to have history of atrial fibrillation. Her heart rate was uncontrolled in the 150s to the 160s. She was started on Cardizem drip as well. Hospitalist requested to admit the patient.    PAST MEDICAL HISTORY: 1.  Atrial fibrillation.  2.  Hypertension.  3.  Stress urinary incontinence.    PAST SURGICAL HISTORY: Lens implant.   ALLERGIES: LIDOCAINE, CODEINE, DEMEROL.   HOME MEDICATIONS: 1.  Aspirin 81 mg daily.  2.  Digoxin 250 mcg oral daily.  3.  Verapamil 180 extended release daily.  4.  Oxybutynin 15 mg oral daily.  5.  Vitamin B12, 1 tablet daily.  6.  Vitamin B6, 1 tablet daily. 7.  Vitamin D3, 1 tablet daily.  8.  Vitamin E 1 tablet daily.    FAMILY HISTORY: Mother deceased, history of malignancy, unspecified, possibly lung cancer. Father deceased; had history of hip fracture.   SOCIAL HISTORY: Negative for tobacco, alcohol and illicit drug use. The patient lives at home by herself. Neighbors to help to take care of her as well, and they run her errands for her.  REVIEW OF SYSTEMS: CONSTITUTIONAL: The patient denies any fevers, chills. Reports fatigue, weakness.  EYES: Denies blurry vision, double vision, inflammation.  ENT: Denies tinnitus, ear pain, epistaxis.  RESPIRATORY: Reports cough, productive sputum. Denies chronic obstructive pulmonary disease, shortness of breath.  CARDIOVASCULAR: Denies chest pain, edema, palpitation, syncope.  GASTROINTESTINAL: Denies nausea, vomiting, diarrhea, abdominal pain, hematemesis.  GENITOURINARY: Denies dysuria, hematuria, renal colic. ENDOCRINE: Denies polyuria, polydipsia, heat or cold intolerance.  HEMATOLOGY: Denies anemia, easy bruising.  INTEGUMENT: Reports lower extremity chronic wounds for which she is supposed to see dermatology as an outpatient.  MUSCULOSKELETAL: Denies any gout or cramps.  NEUROLOGIC: Denies any  history of CVA, headache, ataxia, vertigo. Reports history of fall. She denies any loss of consciousness, though. PSYCHIATRIC: Denies anxiety, insomnia, or depression.   PHYSICAL EXAMINATION: VITAL SIGNS: Temperature 97.5, pulse 138, respiratory rate 19, blood pressure 133/93, saturating 97%  on room air. Temperature upon presentation was 94.9.  GENERAL: A frail, elderly female who looks comfortable in bed, in no apparent distress.  HEENT: Head atraumatic, normocephalic. Pupils equal, reactive to light. Pink conjunctivae. Anicteric sclerae. Dry oral mucosa.  NECK: Supple. No thyromegaly. No JVD.  CHEST: Good air entry bilaterally. No wheezing, rales, rhonchi.  CARDIOVASCULAR: S1, S2 heard. No rubs, gallops. Has murmur. Tachycardic but regular.  ABDOMEN: Soft, nontender, nondistended. Bowel sounds present.  EXTREMITIES: Has chronic lower extremity changes with bilateral lower extremity wounds, with most significant in the posterior area in the left leg, with some discharge but does not look toxic.  PSYCHIATRIC: The patient is awake, alert x3. She knows the year. She thinks it is July, not June. At some points appears to be slightly confused, answering questions inappropriately, but when the questions are repeated she answers them appropriately. NEUROLOGIC: Cranial nerves grossly intact. Motor 5/5. No focal deficits.  MUSCULOSKELETAL: No joint effusion. Has some tenderness to palpation in the right shoulder area.  SKIN: Multiple skin ecchymoses from her fall, in the right shoulder and the right hip, and has a skin ulceration. Has chronic skin changes in the lower extremities.   PERTINENT LABORATORY DATA: Glucose 99, BUN 45, creatinine 0.83, sodium 142, potassium 4.5, chloride 107, CO2 21, ALT 76, AST 115, alkaline phosphatase 84, albumin 2.7. Troponin 0.03. White blood cells 15.3, hemoglobin 11.9, hematocrit 38.8, platelets 237. INR 1.3. Urinalysis showing 14 white blood cells, +1 leukocyte esterase.   IMAGING STUDIES: CT head without contrast and CT cervical spine showing no acute intracranial or cervical spine fracture. Opacification of the posterior longitudinal ligaments from C5 to C7 and sinusitis with right maxillary and left sphenoid effusion.   ASSESSMENT AND PLAN: 1.  Sepsis. The  patient presents with hypothermia, tachycardia, leukocytosis. Source most likely related to urinary tract infection, pneumonia, and sinusitis as well.  2.  Fall. This is most likely related to her sepsis, dehydration, and atrial fibrillation with rapid ventricular response.  3.  Atrial fibrillation with rapid ventricular response. The patient will be hydrated appropriately, will be started on Cardizem drip. We will load her with digoxin, as the level is subtherapeutic. We will consult cardiology service. She will be admitted to Critical Care Unit.  4.  Sepsis due to urinary tract infection. The patient will be continued on Levofloxacin.  5.  Pneumonia. The patient will be treated for healthcare acquired pneumonia on levofloxacin.  6.  Sinusitis. We will continue with IV levofloxacin as well.  7.  Rhabdomyolysis. The patient's creatinine appears to be within normal limits. Has elevated total CK. We will continue with aggressive hydration and will monitor her total CK level.  8.  Hypothermia. The patient will be continued with warming blanket, appears to be improving at this point.  9.  Chronic lower extremity wound. We will consult wound care service.  10.  Right upper extremity edema. We will check venous Doppler.  11.  Deep vein thrombosis prophylaxis. Subcutaneous heparin.   CODE STATUS: Discussed with the patient, who reports she is do not resuscitate. The patient tried to contact the patient's son, who was only the available phone number in our medical records, with no success. The patient's neighbor will try to get [place]  his contact information as well. The patient requested not to be visited by her daughter-in-law, which I relayed the message to the nursing staff at the unit.   TOTAL TIME SPENT ON ADMISSION AND PATIENT CARE: 60 minutes.     ____________________________ Albertine Patricia, MD dse:cg D: 06/25/2014 01:12:40 ET T: 06/25/2014 04:45:56 ET JOB#: [id number]  cc: Albertine Patricia,  MD, <Dictator> DAWOOD Graciela Husbands MD ELECTRONICALLY SIGNED 07/05/2014 0:50

## 2015-04-19 NOTE — Discharge Summary (Signed)
PATIENT NAME:  Heather Barker, Heather Barker MR#:  [id number] DATE OF BIRTH:  [birth year]  DATE OF ADMISSION:  06/24/2014 DATE OF DISCHARGE:  06/29/2014  DISCHARGE DIAGNOSES:   1.  Atrial fibrillation with rapid ventricular response. 2.  Urinary tract infection. 3.  Enterococcus faecium bacteremia. 4.  Staphylococcus epidermis bacteremia. 5.  Physical debility. 6.  Acute kidney injury. 7.  Dementia. 8.  Deep venous thrombosis.  CONSULTATIONS:  1.  Cardiology, Drs. Ubaldo Glassing and Nehemiah Massed. 2.  Infectious disease, Dr. Ola Spurr.  HISTORY OF PRESENT ILLNESS:  She, interestingly, was admitted through the Emergency Room where she presented with a fall.  She was found by maintenance on the floor, confused. She was hypothermic at presentation with leukocytosis and a modestly elevated creatinine kinase.  Impression of urinary tract infection, rhabdomyolysis, and atrial fibrillation with rapid ventricular response with possible sepsis was made.  She was admitted to the Intensive Care Unit. Please refer to history and physical for full details.   HOSPITAL COURSE: Following admission to the intensive care unit, patient was placed on intravenous Cardizem drip, which resulted in a controllable heart rate. She was transfused oral agents and transferred to a monitored bed. The patient's blood cultures subsequently grew enterococcus and Staphylococcus edematous.  She was initially treated on vancomycin and had also been on Levaquin for urinary tract infection. The patient did not spike any fevers.  Her white cell count normalized.  Dr. Ola Spurr evaluated the patient and agreed with him of that course of antibiotics. The patient's heart rate was eventually fully controlled with the addition of  metoprolol and transition of short acting diltiazem to a long acting formulation.   The patient was severely physically debilitated on physical therapy evaluation to a skilled facility for discharge. Patient does have a history of  dementia, but is fairly oriented.  She is being discharged to a skilled nursing facility in satisfactory condition.   Ultrasound was positive for deep venous thrombosis.  DISCHARGE MEDICATIONS:   1. Aspirin 81 mg daily.  2. Vitamin E daily. 3. Vitamin D3 daily. 4. Vitamin B12 daily. 5. Vitamin B6 daily. 6. Oxybutynin 15 mg once daily. 7. Digoxin 50 mcg daily. 8. Xarelto 15 mg b.i.d. x 18 days.  9. Then Xarelto 20 mg daily subsequent to that.  10. Metoprolol 25 mg b.i.d.  11. Cardizem CD 240 mg once a day.  12. Vancomycin 50 mg every 12 hours, stop date 07/09/2014. 13. Levaquin 50 mg daily, stop date 07/06/2014.  DISCHARGE INSTRUCTIONS:   DIET:  Low sodium, low fat.  ACTIVITY:  As tolerated.  FOLLOW UP:   1.  Follow up with Dr. Ola Spurr in 1 week.  2.  Follow up with Dr. Ubaldo Glassing of cardiology in 1-2 weeks. 3.  Follow up with Dr. Lamonte Sakai in 2-4 weeks.    INSTRUCTIONS:   1.  Vancomycin level on 07/01/2014.  Fax those to Dr. Ola Spurr of [facility].  2.  Basic metabolic panel Monday and Thursday.  Fax those to Dr. Ola Spurr.   DISCHARGE PROCESS TIME SPENT:  40 minutes. ____________________________ Venetia Maxon Elijio Miles, MD sat:ts D:  06/29/2014 10:58:00 ET  T:  06/29/2014 16:05:37 ET  JOB#:  [id number]  cc: Shanon Brow P. Ola Spurr, MD, Javier Docker. Ubaldo Glassing, MD, Perrin Maltese, MD Alfredia Ferguson Lisette Abu MD ELECTRONICALLY SIGNED 07/17/2014 13:22

## 2015-04-19 NOTE — H&P (Signed)
PATIENT NAME:  Heather Barker, Heather Barker MR#:  [id number] DATE OF BIRTH:  [birth year]  DATE OF ADMISSION:  08/16/2014  PRIMARY CARE PHYSICIAN: Jaquelyn Bitter B. Brynda Greathouse, MD.   CHIEF COMPLAINT: Shortness of breath.   HISTORY OF PRESENT ILLNESS: This is an 81 year old female who presents to the Emergency Room due to shortness of breath. The patient, herself, is a poor historian, therefore, most of the history obtained from the son at bedside and also from the chart. The patient was just recently discharged from the hospital last month. At that time, she had sepsis secondary to a PICC line infection. Presently, she comes with shortness of breath and hypoxia. The patient apparently just  received 1 dose of intramuscular Lasix today. She is not on scheduled diuretics normally. She presented and was noted to have congestive heart failure. Her chest x-ray findings were suggestive of pulmonary edema and CHF. The patient admits to a baseline 2-pillow orthopnea. No chest pain. No nausea positive nausea. No vomiting. No diarrhea. No abdominal pain. No other associated symptoms.   REVIEW OF SYSTEMS:  CONSTITUTIONAL: No documented fever. No weight gain, no weight loss.  EYES: No blurred or double vision.  ENT: No tinnitus. No postnasal drip. No redness of the oropharynx.  RESPIRATORY: No cough, no wheeze, no hemoptysis. Positive dyspnea.  CARDIOVASCULAR: No chest pain. Positive 2-pillow orthopnea. No palpitations, no syncope.  GASTROINTESTINAL: Positive nausea. No vomiting. No diarrhea. No abdominal pain. No melena or hematochezia.  GENITOURINARY: No dysuria or hematuria.  ENDOCRINE: No polyuria or nocturia. No heat or cold  HEMATOLOGIC:   No anemia, no bruising, no bleeding.  INTEGUMENTARY: No rashes or lesions.  MUSCULOSKELETAL: No arthritis, swelling. No gout.  NEUROLOGIC: No numbness or tingling. No ataxia. No seizure-type activity.  PSYCHIATRIC: No anxiety, no insomnia. No ADD.    PAST MEDICAL HISTORY: Consistent with  hypertension, history of chronic atrial fibrillation, history of CHF, history of sepsis and bacteremia due to a recent PICC line infection.   ALLERGIES: CODEINE, DEMEROL AND LIDOCAINE.   SOCIAL HISTORY: No smoking. No alcohol abuse. No illicit drug abuse. Currently resides at a skilled nursing facility.   FAMILY HISTORY: Mother and father both deceased. Mother died from cancer of unknown type. Father died from old age and dementia.   CURRENT MEDICATIONS: Clonidine 0.1 mg daily, digoxin 250 mcg daily, Cardizem CD 240 mg daily Lamictal 25 mg 2 tablets b.i.d., metoprolol tartrate 25 mg b.i.d., oxybutynin 50 mg daily as needed, promethazine 25 mg q. 6 hours as needed, Tylenol 650 q. 4 hours as needed and Xarelto 20 mg daily.   PHYSICAL EXAMINATION:  VITAL SIGNS:  Temperature 97.7, pulse 54, respirations 20, blood pressure 152/65, saturations 98% on room air.  GENERAL: She is a pleasant-appearing female in no apparent distress.  HEAD, EYES, EARS, NOSE AND THROAT: Atraumatic, normocephalic. Extraocular muscles are intact. Pupils equal and reactive to light. Sclerae anicteric. No conjunctival injection. No oropharyngeal erythema.  NECK: Supple. There is no jugular venous distention.  No bruits, no lymphadenopathy, no thyromegaly.  HEART:  Irregular. No murmurs, no rubs, no clicks.  LUNGS: She has bibasilar crackles. Negative use of accessory muscles. No dullness to percussion.  ABDOMEN: Soft, flat, nontender, nondistended. Has good bowel sounds. No hepatosplenomegaly appreciated.  EXTREMITIES: No evidence of any cyanosis, clubbing, or peripheral edema. Has +2 pedal and radial pulses bilaterally.  NEUROLOGICAL: The patient is alert, awake, and oriented x3 with no focal, motor or sensory deficits appreciated bilaterally.  SKIN: Moist and warm with no  rashes appreciated.  LYMPHATIC: There is no cervical or axillary lymphadenopathy.   LABORATORY DATA: Serum glucose of 148, BUN 15, creatinine 0.7, sodium  140, potassium 3.4, chloride 102, bicarbonate 29. The patient's LFTs showed albumin of 2.9, AST of 16, ALT 12, troponin 0.03. White cell count 10.2, hemoglobin 8.2, hematocrit 26, platelet count 277,000.  The patient did have an EKG done which showed atrial fibrillation, but no other acute ST or T wave changes.   RADIOLOGY DATA:  The patient did have a chest x-ray done which showed worsening CHF, cardiomegaly, and probable bilateral small pleural effusions.   ASSESSMENT AND PLAN: This is an 81 year old female with a history of hypertension, history of chronic atrial fibrillation, history of previous congestive heart failure, sepsis and bacteremia due to peripherally inserted central catheter line infection came to the hospital due to shortness of breath and noted to be in decompensated congestive heart failure.  1.  Decompensated congestive heart failure. This is likely acute on chronic diastolic dysfunction. The patient was not on any diuretics prior to coming to the hospital. For now, I will diurese her with IV Lasix, follow I's and O's and daily weights. As mentioned, her  last echocardiogram did show EF of 65%. I will continue her beta blockers for now. Consider starting her on low-dose ACE inhibitors.  2.  Chronic atrial fibrillation, currently rate controlled. Will continue her digoxin, metoprolol and Cardizem. The patient is on long-term anticoagulation. I will continue his Xarelto.  3.  Hypertension, presently hemodynamically stable. Continue clonidine, Cardizem and metoprolol.  4.  Urinary incontinence. Continue oxybutynin.   CODE STATUS: The patient is a DNI/DNR.  TIME SPENT ON ADMISSION:  45 minutes.    ____________________________ Belia Heman. Verdell Carmine, MD vjs:DT D: 08/16/2014 18:35:20 ET T: 08/16/2014 19:15:48 ET JOB#: [id number]  cc: Belia Heman. Verdell Carmine, MD, <Dictator> Henreitta Leber MD ELECTRONICALLY SIGNED 08/24/2014 11:48

## 2015-04-19 NOTE — Consult Note (Signed)
PATIENT NAME:  Heather Barker, Heather Barker MR#:  [id number] DATE OF BIRTH:  1930/07/12  DATE OF CONSULTATION:  07/01/2014  REQUESTING PHYSICIAN:  Dr. Elijio Miles  CONSULTING PHYSICIAN:  Cheral Marker. Ola Spurr, MD  REASON FOR CONSULTATION:  Bacteremia.   HISTORY OF PRESENT ILLNESS: This is an 81 year old female with multiple medical problems, recently admitted with enterococcal and coag-negative staph bacteremia. She was discharged on July 4th after placement of a PICC line and was on vancomycin. She was readmitted due to respiratory failure as well as due to some bleeding from her PICC site. We are consulted for further antibiotic recommendations.   Of note, the plan at discharge was to complete a 2-week course of vancomycin and levofloxacin.   PAST MEDICAL HISTORY:  1. Atrial fibrillation.  2. Hypertension.  3. DVT.  4. Recent Staphylococcus epidermidis and enterococcal bacteremia.   SOCIAL HISTORY: No tobacco, alcohol or drugs.   FAMILY HISTORY: Noncontributory.   ALLERGIES: CODEINE, DEMEROL, LIDOCAINE.  ANTIBIOTICS AT ADMISSION: Included vancomycin 750 b.i.d. as well as levofloxacin 250 once a day. The antibiotics have been continued.    REVIEW OF SYSTEMS:  Unable to be obtained.   PHYSICAL EXAMINATION:  VITAL SIGNS: Temperature 97.7, pulse 93, blood pressure 125/76, respirations 16, saturations 96% on 2 liters.  GENERAL: She is elderly, quite frail-appearing, very confused, really not able to have a conversation.   HEENT: Pupils equal, round, and reactive to light and accommodation. Extraocular movements are intact. Sclerae anicteric.  OROPHARYNX: Mucous membranes are dry.  HEART: Regular.  LUNGS: Coarse breath sounds bilaterally.  ABDOMEN: Soft, nontender.  EXTREMITIES: No lower extremity edema. Right upper extremity does have edema.  LOWER EXTREMITY SKIN: She has ulcer on the back of her left leg that is relatively clean.  The right leg some healing scabs. NEUROLOGICAL:  She is awake and  interactive but not coherent or oriented.   LABORATORY DATA: Creatinine 0.55. White blood count 12.5, hemoglobin 9.6, platelets of 237,000. Blood cultures x 2 done 07/01are no growth to date. Prior blood cultures from June 29th grew Staphylococcus epidermidis in 2/2, as well as enterococcus. Urine culture grew Escherichia coli greater than 100,000. Urinalysis at that time had 14 white cells.   IMAGING DATA:  Chest x-ray June 6th showed persistent bilateral pleural effusions, persistent bilateral lower lobe airspace opacities concerning for pneumonia but some improvement overall. Doppler June 30th of the upper extremity showed thrombosis of the right brachial and basilic veins.   IMPRESSION: An 81 year old with multiple medical problems, initially admitted June 30th with sepsis and bacteremia after being found down. She grew enterococcus and Staphylococcus epidermidis from her blood and Escherichia coli from her urine. Her cultures were negative. On July 1st, she was discharged home but has had complications with DVT in the right upper extremity, as well as a PICC line bleeding in the left upper extremity.   RECOMMENDATIONS:  1. At this point, I would shorten her course to just 10 days of IV vancomycin. She would be due to finish this on the 8th. She can also finish the levofloxacin at that time. At that time, PICC can be removed and she can be discharged with outpatient followup to ensure that her bacteremia has resolved.  2. Her lower extremity ulcer is improving on the left leg and there is really no infectious on the right leg. Continue local wound care with Santyl to the left leg. 3. Thank you for the consult.  Will be glad to follow with you.  ____________________________ Cheral Marker. Ola Spurr, MD dpf:dd D: 07/01/2014 21:13:00 ET T: 07/02/2014 02:32:31 ET JOB#: [id number]  cc: Cheral Marker. Ola Spurr, MD, <Dictator> DAVID Ola Spurr MD ELECTRONICALLY SIGNED 07/15/2014 21:15

## 2015-04-19 NOTE — Discharge Summary (Signed)
PATIENT NAME:  Heather Barker, Heather Barker MR#:  [id number] DATE OF BIRTH:  12/20/1930  DATE OF ADMISSION:  06/24/2014 DATE OF DISCHARGE:  06/29/2014  DISCHARGE DIAGNOSES:   1.  Atrial fibrillation with rapid ventricular response. 2.  Urinary tract infection. 3.  Enterococcus faecium bacteremia. 4.  Staphylococcus epidermis bacteremia. 5.  Physical debility. 6.  Acute kidney injury. 7.  Dementia. 8.  Deep venous thrombosis.  CONSULTATIONS:  1.  Cardiology, Drs. Ubaldo Glassing and Nehemiah Massed. 2.  Infectious disease, Dr. Ola Spurr.  HISTORY:  She, interestingly, was admitted through the Emergency Room where she presented with a fall.  She was found by maintenance on the floor, confused. She was hypothermic at presentation with leukocytosis and a modestly elevated creatinine kinase.  Impression of urinary tract infection and rhabdomyolysis and atrial fibrillation with rapid ventricular response with possible sepsis was made.  She was admitted to the Intensive Care Unit. Please refer to history and physical for full details.   HOSPITAL COURSE: Following admission to the intensive care unit, patient was placed on intravenous Cardizem drip, which resulted in a controllable heart rate. She was transfused oral agents and transferred to a monitored bed. The patient's blood cultures subsequently grew enterococcus and Staphylococcus edematous.  She was initially treated on vancomycin and had also been on Levaquin for urinary tract infection. The patient did not spike any fevers.  Her white cell count normalized.  Dr. Ola Spurr evaluated the patient and agreed with him of that course of antibiotics. The patients heart rate was eventually fully controlled with the addition of  metoprolol and transition of short acting diltiazem to a long acting formulation.   The patient was severely physically debilitated on physical therapy evaluation to a skilled facility for discharge. Patient does have a history of dementia, but is fairly  oriented.  She is being discharged to a skilled nursing facility in satisfactory condition.   Ultrasound was positive for deep venous thrombosis.  DISCHARGE MEDICATIONS:   1.  Aspirin 81 mg daily.  2.  Vitamin E daily. 3.  Vitamin D3 daily. 4.  Vitamin B12 daily. 5.  Vitamin B6 daily. 6.  (Dictation Anomaly)<<MISSING TEXT>> mg once daily. 7.  Digoxin 50 mcg daily. 8.  Xarelto 15 mg b.i.d. x 18 days.  9.  Then Xarelto 20 mg daily subsequent to that.  10.  Metoprolol 25 mg b.i.d.  11.  Cardizem CD 240 mg once a day.  12.  Vancomycin 50 mg every 12 hours, stop date 07/09/2014. 13.  Levaquin 50 mg daily, stop date 07/06/2014.  DISCHARGE INSTRUCTIONS:   DIET:  Low sodium, low fat. ACTIVITY:  As tolerated. FOLLOW UP:   1.  Follow up with Dr. Ola Spurr in 1 week.  2.  Follow up with Dr. Ubaldo Glassing of cardiology in 1-2 weeks. 3.  Follow up with Dr. Lamonte Sakai in 2-4 weeks.    INSTRUCTIONS:   1.  Vancomycin level on 07/01/2014.  Fax those to Dr. Ola Spurr of [facility].  2.  Basic metabolic panel Monday and Thursday.  Fax those to Dr. Ola Spurr.   DISCHARGE PROCESS TIME SPENT:  40 minutes.    ____________________________ Sammuel Hines. Richardson Landry, MD psb:ts D: 06/29/2014 10:58:14 ET T: 06/29/2014 16:05:37 ET JOB#: [id number]  cc: Sammuel Hines. Richardson Landry, MD, <Dictator> Cheral Marker. Ola Spurr, MD Javier Docker Ubaldo Glassing, MD Perrin Maltese, MD

## 2015-04-19 NOTE — Consult Note (Signed)
   Present Illness 81 year old female with history of pulmonary hypertension, peripheral edema a, dementia as well as atrial fibrillation who was admitted to [facility] with confusion, being found down and with atrial fibrillation rapid ventricular response.  Patient is a very difficult historian with.  Diffuse rambling reports.  His is unclear whether she was taking any of her medications.  She was noted to have deep vein thromboses and has been placed on Xarelto for this.  She is on diltiazem and low-dose IV with good rate control.   Physical Exam:  GEN thin, disheveled   HEENT hearing intact to voice, moist oral mucosa   NECK No masses   RESP normal resp effort  clear BS   CARD Irregular rate and rhythm  No murmur   ABD denies tenderness  no Abdominal Bruits   LYMPH negative neck, negative axillae   EXTR negative cyanosis/clubbing, negative edema   SKIN positive rashes, positive ulcers   NEURO motor/sensory function intact   PSYCH poor insight   Review of Systems:  Subjective/Chief Complaint Confusion   General: Fatigue   Skin: No Complaints   ENT: No Complaints   Eyes: No Complaints   Neck: No Complaints   Respiratory: Short of breath   Cardiovascular: Palpitations   Gastrointestinal: No Complaints   Genitourinary: No Complaints   Vascular: No Complaints   Musculoskeletal: No Complaints   Neurologic: No Complaints   Hematologic: No Complaints   Endocrine: No Complaints   Psychiatric: confusion   Review of Systems: All other systems were reviewed and found to be negative   Medications/Allergies Reviewed Medications/Allergies reviewed   Family & Social History:  Family and Social History:  Family History Non-Contributory   EKG:  Interpretation atrial fibrillation with variable ventricular response    Lidocaine: Other  Codeine: Other  Demerol: Unknown   Impression 81 year old female with history of atrial fibrillation and confusion.  Admit after  being found to be on the ground.  She also was noted to have deep vein thromboses.  She had infected ulcerations on her legs with maggots in place.  Her atrial fibrillation rate is much better controlled with Cardizem.  It is unclear whether she is taking any of her medications at home.  She was placed on Xarelto during this admission for lower extremity the DVTs.  She will need placement for consideration of using this drug as compliance would be a major issue.  Her rate is well control with IV Cardizem.  Will discontinue this and place on p.o. Cardizem.   Plan 1. Discontinue IV Cardizem place on Cardizem p.o. 30 mg Q 6 2. Would continue with Xarelto for now but will need close monitoring of her medication dosing given her altered mental status. 3. Consideration for advanced placement due to confusion and difficulty living situation 4. okay to transfer to telemetry   Electronic Signatures: Teodoro Spray (MD)  (Signed 30-Jun-15 16:54)  Authored: General Aspect/Present Illness, History and Physical Exam, Review of System, Family & Social History, EKG , Allergies, Impression/Plan   Last Updated: 30-Jun-15 16:54 by Teodoro Spray (MD)

## 2015-04-19 NOTE — Discharge Summary (Signed)
Dates of Admission and Diagnosis:  Date of Admission 16-Aug-2014   Date of Discharge 19-Aug-2014   Admitting Diagnosis ac CHF   Final Diagnosis Ac diastolic CHF Htn Wrist pain Ch afib- rate controlled on Xarelto.    Chief Complaint/History of Present Illness an 81 year old female who presents to the Emergency Room due to shortness of breath. The patient, herself, is a poor historian, therefore, most of the history obtained from the son at bedside and also from the chart. The patient was just recently discharged from the hospital last month. At that time, she had sepsis secondary to a PICC line infection. Presently, she comes with shortness of breath and hypoxia. The patient apparently just  received 1 dose of intramuscular Lasix today. She is not on scheduled diuretics normally. She presented and was noted to have congestive heart failure. Her chest x-ray findings were suggestive of pulmonary edema and CHF. The patient admits to a baseline 2-pillow orthopnea. No chest pain. No nausea positive nausea. No vomiting. No diarrhea. No abdominal pain. No other associated symptoms.   Allergies:  Lidocaine: Other  Codeine: Other  Demerol: Unknown  Hepatic:  21-Aug-15 15:23   Bilirubin, Total  2.0  Alkaline Phosphatase 108 (46-116 NOTE: New Reference Range 07/16/14)  SGPT (ALT)  12 (14-63 NOTE: New Reference Range 07/16/14)  SGOT (AST) 16  Total Protein, Serum  6.1  Albumin, Serum  2.9  Routine Chem:  21-Aug-15 15:23   Glucose, Serum  148  BUN 15  Creatinine (comp) 0.74  Sodium, Serum 140  Potassium, Serum  3.4  Chloride, Serum 102  CO2, Serum 29  Calcium (Total), Serum  7.8  Anion Gap 9  Osmolality (calc) 283  eGFR (African American) >60  eGFR (Non-African American) >60 (eGFR values <37mL/min/1.73 m2 may be an indication of chronic kidney disease (CKD). Calculated eGFR is useful in patients with stable renal function. The eGFR calculation will not be reliable in acutely ill  patients when serum creatinine is changing rapidly. It is not useful in  patients on dialysis. The eGFR calculation may not be applicable to patients at the low and high extremes of body sizes, pregnant women, and vegetarians.)  B-Type Natriuretic Peptide [facility])  5676 (Result(s) reported on 16 Aug 2014 at 05:26PM.)  Result Comment APTT - RESULTS VERIFIED BY REPEAT TESTING.  - NOTIFIED OF CRITICAL VALUE  - LAB/SHANNON HATCH AT 1608 08/16/14  - READ-BACK PROCESS PERFORMED.  Result(s) reported on 16 Aug 2014 at 04:13PM.  22-Aug-15 04:12   Glucose, Serum 96  BUN 18  Creatinine (comp) 0.72  Sodium, Serum 140  Potassium, Serum  3.4  Chloride, Serum 102  CO2, Serum 29  Calcium (Total), Serum  8.0  Anion Gap 9  Osmolality (calc) 281  eGFR (African American) >60  eGFR (Non-African American) >60 (eGFR values <45mL/min/1.73 m2 may be an indication of chronic kidney disease (CKD). Calculated eGFR is useful in patients with stable renal function. The eGFR calculation will not be reliable in acutely ill patients when serum creatinine is changing rapidly. It is not useful in  patients on dialysis. The eGFR calculation may not be applicable to patients at the low and high extremes of body sizes, pregnant women, and vegetarians.)  Cardiac:  21-Aug-15 15:23   Troponin I 0.03 (0.00-0.05 0.05 ng/mL or less: NEGATIVE  Repeat testing in 3-6 hrs  if clinically indicated. >0.05 ng/mL: POTENTIAL  MYOCARDIAL INJURY. Repeat  testing in 3-6 hrs if  clinically indicated. NOTE: An increase or decrease  of  30% or more on serial  testing suggests a  clinically important change)  Routine Coag:  21-Aug-15 15:23   Prothrombin  35.5  INR 3.7 (INR reference interval applies to patients on anticoagulant therapy. A single INR therapeutic range for coumarins is not optimal for all indications; however, the suggested range for most indications is 2.0 - 3.0. Exceptions to the INR Reference Range may  include: Prosthetic heart valves, acute myocardial infarction, prevention of myocardial infarction, and combinations of aspirin and anticoagulant. The need for a higher or lower target INR must be assessed individually. Reference: The Pharmacology and Management of the Vitamin K  antagonists: the seventh ACCP Conference on Antithrombotic and Thrombolytic Therapy. IRSWN.4627 Sept:126 (3suppl): [id number]. A HCT value >55% may artifactually increase the PT.  In one study,  the increase was an average of 25%. Reference:  "Effect on Routine and Special Coagulation Testing Values of Citrate Anticoagulant Adjustment in Patients with High HCT Values." American Journal of Clinical Pathology 2006;126:400-405.)  Activated PTT (APTT)  65.1 (A HCT value >55% may artifactually increase the APTT. In one study, the increase was an average of 19%. Reference: "Effect on Routine and Special Coagulation Testing Values of Citrate Anticoagulant Adjustment in Patients with High HCT Values." American Journal of Clinical Pathology 2006;126:400-405.)  Routine Hem:  21-Aug-15 15:23   WBC (CBC) 10.2  RBC (CBC)  3.34  Hemoglobin (CBC)  8.2  Hematocrit (CBC)  26.0  Platelet Count (CBC) 277 (Result(s) reported on 16 Aug 2014 at 03:37PM.)  MCV  78  MCH  24.6  MCHC  31.6  RDW  19.5  22-Aug-15 04:12   WBC (CBC) 9.4  RBC (CBC)  3.29  Hemoglobin (CBC)  8.0  Hematocrit (CBC)  25.3  Platelet Count (CBC) 267  MCV  77  MCH  24.2  MCHC  31.4  RDW  19.0  Neutrophil % 72.3  Lymphocyte % 15.4  Monocyte % 8.2  Eosinophil % 3.1  Basophil % 1.0  Neutrophil #  6.8  Lymphocyte # 1.4  Monocyte # 0.8  Eosinophil # 0.3  Basophil # 0.1 (Result(s) reported on 17 Aug 2014 at 05:07AM.)   PERTINENT RADIOLOGY STUDIES: XRay:    21-Aug-15 15:56, Chest PA and Lateral  Chest PA and Lateral   REASON FOR EXAM:    hypoxia  COMMENTS:       PROCEDURE: DXR - DXR CHEST PA (OR AP) AND LATERAL  - Aug 16 2014  3:56PM     CLINICAL  DATA:  Hypoxia    EXAM:  CHEST  2 VIEW    COMPARISON:  07/01/2014    FINDINGS:  Cardiomegaly again noted. There is worsening perihilar and  infrahilar interstitial prominence with hazy airspace disease highly  suspicious for pulmonary edema. Probable bilateral small pleural  effusion. Osteopenia and degenerative changes thoracolumbar spine.     IMPRESSION:  Worsening congestive heart failure. Cardiomegaly. Probable bilateral  small pleural effusion.      Electronically Signed    By: Lahoma Crocker M.D.    On: 08/16/2014 15:59         Verified By: Ephraim Hamburger, M.D.,    24-Aug-15 13:10, Chest PA and Lateral  Chest PA and Lateral   REASON FOR EXAM:    compare CHF  COMMENTS:       PROCEDURE: DXR - DXR CHEST PA (OR AP) AND LATERAL  - Aug 19 2014  1:10PM     CLINICAL DATA:  81 year old female with pain and swelling, shortness  of breath and cough. Initial encounter.    EXAM:  CHEST2 VIEW    COMPARISON:  08/16/2014 and earlier.    FINDINGS:  Stable cardiomegaly and mediastinal contours. Interval regressed  interstitial opacity, but continued veiling opacity at both lung  bases compatible with small to moderate effusions. No pneumothorax.  Associated bibasilar atelectasis. Overall, ventilation has not  significantly changed. Osteopenia. Visualized tracheal air column is  within normal limits.     IMPRESSION:  Regressed pulmonary interstitial edema but small to moderate  bilateral pleural effusions with atelectasis.      Electronically Signed    By: Lars Pinks M.D.    On: 08/19/2014 13:29     Verified By: Gwenyth Bender. HALL, M.D.,    24-Aug-15 13:10, Wrist Right AP and Lateral  Wrist Right AP and Lateral   REASON FOR EXAM:    pain and swelling.  COMMENTS:       PROCEDURE: DXR - DXR WRIST RIGHT AP AND LATERAL  - Aug 19 2014  1:10PM     CLINICAL DATA:  Right wrist pain and swelling.    EXAM:  RIGHT WRIST - 2 VIEW    COMPARISON:  None.    FINDINGS:  Thereis no  evidence of fracture or dislocation. Narrowing and  osteophyte formation is seen involving the first carpometacarpal  joint. Soft tissues are unremarkable.     IMPRESSION:  Osteoarthritis of the first carpometacarpal joint. No acute  abnormality seen in the right wrist.      Electronically Signed    By: Sabino Dick M.D.    On: 08/19/2014 13:35         Verified By: Marveen Reeks, M.D.,  LabUnknown:    21-Aug-15 15:56, Chest PA and Lateral  PACS Image     24-Aug-15 13:10, Chest PA and Lateral  PACS Image     24-Aug-15 13:10, Wrist Right AP and Lateral  PACS Image    Pertinent Past History:  Pertinent Past History hypertension, history of chronic atrial fibrillation, history of CHF, history of sepsis and bacteremia due to a recent PICC line infection.   Hospital Course:  Hospital Course an 81 year old female with a history of hypertension, history of chronic atrial fibrillation, history of previous congestive heart failure, sepsis and bacteremia due to peripherally inserted central catheter line infection came to the hospital due to shortness of breath and noted to be in decompensated congestive heart failure.  1.  Decompensated congestive heart failure.  likely acute on chronic diastolic dysfunction.    last echocardiogram did show EF of 65%.continue her beta blockers for now. starting her on low-dose ACE inhibitors.    iV lasix. Appreciated consult with her primary cardiologist - [place].   try to taper oxygen.- could not tolerate this morning- symptomatically improving.   will d/c with oxygen to NH.   2.  Chronic atrial fibrillation, currently rate controlled.   continue her digoxin, metoprolol and Cardizem.  on long-term anticoagulation.  continue his Xarelto.    INR in high. no active bleed.  3.  Hypertension, presently hemodynamically stable. Continue clonidine, Cardizem and metoprolol.   4.  Urinary incontinence. Continue oxybutynin.  5. Wrist pain   right wrist pain  after some PT few days ago.   Xray-- no acute changes.   Condition on Discharge Stable   Code Status:  Code Status No Code/Do Not Resuscitate   DISCHARGE INSTRUCTIONS HOME MEDS:  Medication Reconciliation: Patient's Home Medications at Discharge:     Medication Instructions  oxybutynin 15 mg/24 hr oral tablet, extended release  1 tab(s) orally once a day, As Needed for stress   digoxin 250 mcg (0.25 mg) oral tablet  1 tab(s) orally once a day   metoprolol tartrate 25 mg oral tablet  1 tab(s) orally 2 times a day   clonidine 0.1 mg oral tablet  1 tab(s) orally once a day for 1 days   diltiazem hydrochloride cd 240 mg/24 hours oral capsule, extended release  1 cap(s) orally once a day   lamictal 25 mg oral tablet  2 tab(s) orally 2 times a day   promethazine 25 mg oral tablet  1 tab(s) orally every 6 hours, As Needed - for Nausea, Vomiting   tylenol 325 mg oral tablet  2 tab(s) orally every 4 hours, As Needed - for Pain   xarelto 20 mg oral tablet  1 tab(s) orally once a day   furosemide 20 mg oral tablet  1 tab(s) orally once a day   klor-con m20 20 meq oral tablet, extended release  1 tab(s) orally 2 times a day     Physician's Instructions:  Home Oxygen? Yes   Oxygen delivery at home: 2L  Nasal Cannula   Diet Low Sodium   Activity Limitations As tolerated   Return to Work Not Applicable   Time frame for Follow Up Appointment 1-2 weeks  PMD   Other Comments check renal function in 2 weeks with PMD.     Fulton Reek D(Family Physician): [facility], [address] [place], [place] [address], [place] [phone]  Electronic Signatures: Vaughan Basta (MD)  (Signed 24-Aug-15 14:12)  Authored: ADMISSION DATE AND DIAGNOSIS, CHIEF COMPLAINT/HPI, Allergies, PERTINENT LABS, PERTINENT RADIOLOGY STUDIES, PERTINENT PAST HISTORY, HOSPITAL COURSE, [place], PATIENT INSTRUCTIONS, Follow Up Physician   Last Updated: 24-Aug-15 14:12 by Vaughan Basta (MD)

## 2015-04-19 NOTE — Consult Note (Signed)
PATIENT NAME:  Heather Barker, Heather Barker MR#:  [id number] DATE OF BIRTH:  [birth year]  DATE OF CONSULTATION:  08/17/2014  REFERRING PHYSICIAN:    Abel Presto, MD  PRIMARY CARE PHYSICIAN: Nicky Pugh, MD   CONSULTING PHYSICIAN:  Isaias Cowman, MD  CARDIOLOGIST:  Bartholome Bill, MD   CHIEF COMPLAINT:  Shortness of breath.    HISTORY OF PRESENT ILLNESS:  The patient is an 81 year old female, admitted at this time with shortness of breath and congestive heart failure.  The patient currently is a resident at [facility].  She apparently was in her usual state of health until she developed progressive shortness of breath and presented to the [facility] Emergency Room where she was noted to be hypoxic.  Chest x-ray revealed evidence for pulmonary edema. The patient was given intravenous furosemide with diuresis.  The patient currently denies chest pain.  Admission labs were notable for a BMP of 5676.  Troponin was negative.   PAST MEDICAL HISTORY:  1.  Congestive heart failure. 2.  Hypertension. 3.  Chronic atrial fibrillation. 4.  History of bacteremia and sepsis.  MEDICATIONS:  Clonidine 0.1 mg daily,  digoxin 0.25 mg daily, Cardizem CD 240 mg daily, metoprolol tartrate 25 mg b.i.d., Xarelto 20 mg daily, Lamictal 25 mg 2 tablets b.i.d., oxybutynin 50 mg daily p.r.n., promethazine 25 mg every 6 p.r.n., Tylenol 1 every 4 hours p.r.n.    SOCIAL HISTORY: The patient currently resides [facility]. She denies tobacco abuse.   FAMILY HISTORY: No immediate family history of coronary artery disease or myocardial infarction.   REVIEW OF SYSTEMS:  CONSTITUTIONAL:  No fever, chills. EYES:  No blurry vision. EARS:  No hearing loss.  RESPIRATORY:  The patient has shortness of breath as described above.  CARDIOVASCULAR: The patient denies chest pain.  GASTROINTESTINAL:  The patient has nausea without vomiting.  GENITOURINARY:  No dysuria, hematuria. ENDOCRINE:  No polyuria or polydipsia.   HEMATOLOGIC:  No easy bruising or bleeding.  INTEGUMENT:  No rash.  MUSCULOSKELETAL:  No arthralgias or myalgias.   NEUROLOGIC:  No focal muscle weakness. PSYCHOLOGICAL: No depression or anxiety.   PHYSICAL EXAMINATION:   VITAL SIGNS:  Blood pressure 153/71, pulse 48, respirations 16, temperature 97.9, pulse ox 95%.    HEENT: Pupils equal and reactive to light and accommodation.  NECK: Supple without thyromegaly.  LUNGS: Breath sounds in both bases.  CARDIOVASCULAR: Normal JVP, normal PMI.  Irregular rate and rhythm.  . No appreciable gallop, murmur or rub.   ABDOMEN:  Soft, nontender. Pulses were intact bilaterally.   MUSCULOSKELETAL: Normal muscle tone.  NEUROLOGIC:  The patient is alert and oriented x 3 motor and sensory both grossly intact.    IMPRESSION An 81 year old female with chronic atrial fibrillation and recurrent congestive heart failure with likely due to acute on chronic diastolic congestive heart failure with known normal left ventricular function.  Atrial fibrillation appears stable.  Rate appears to be well-controlled.    RECOMMENDATIONS: 1.  Agree with overall current therapy.  2.  Would defer full dose anticoagulation. 3.   Continue diuresis. 4.   Follow BUN and creatinine closely. 5.   Defer further cardiac diagnostics. 6.  Adjust warfarin and for target INR of 2 to 2.5.      ____________________________ Isaias Cowman, MD ap:DT D: 08/17/2014 13:46:00 ET T: 08/17/2014 14:22:53 ET JOB#: [id number]  cc: Isaias Cowman, MD, <Dictator> Isaias Cowman MD ELECTRONICALLY SIGNED 08/29/2014 16:00

## 2015-05-05 DIAGNOSIS — F39 Unspecified mood [affective] disorder: Secondary | ICD-10-CM | POA: Diagnosis not present

## 2015-05-05 DIAGNOSIS — F323 Major depressive disorder, single episode, severe with psychotic features: Secondary | ICD-10-CM | POA: Diagnosis not present

## 2015-05-05 DIAGNOSIS — G3184 Mild cognitive impairment, so stated: Secondary | ICD-10-CM | POA: Diagnosis not present

## 2015-05-12 DIAGNOSIS — R770 Abnormality of albumin: Secondary | ICD-10-CM | POA: Diagnosis not present

## 2015-05-12 DIAGNOSIS — Z5181 Encounter for therapeutic drug level monitoring: Secondary | ICD-10-CM | POA: Diagnosis not present

## 2015-05-12 DIAGNOSIS — I509 Heart failure, unspecified: Secondary | ICD-10-CM | POA: Diagnosis not present

## 2015-06-04 DIAGNOSIS — F323 Major depressive disorder, single episode, severe with psychotic features: Secondary | ICD-10-CM | POA: Diagnosis not present

## 2015-06-04 DIAGNOSIS — F29 Unspecified psychosis not due to a substance or known physiological condition: Secondary | ICD-10-CM | POA: Diagnosis not present

## 2015-06-04 DIAGNOSIS — M6259 Muscle wasting and atrophy, not elsewhere classified, multiple sites: Secondary | ICD-10-CM | POA: Diagnosis not present

## 2015-06-04 DIAGNOSIS — I509 Heart failure, unspecified: Secondary | ICD-10-CM | POA: Diagnosis not present

## 2015-06-27 DIAGNOSIS — M6259 Muscle wasting and atrophy, not elsewhere classified, multiple sites: Secondary | ICD-10-CM | POA: Diagnosis not present

## 2015-06-27 DIAGNOSIS — F29 Unspecified psychosis not due to a substance or known physiological condition: Secondary | ICD-10-CM | POA: Diagnosis not present

## 2015-06-27 DIAGNOSIS — F323 Major depressive disorder, single episode, severe with psychotic features: Secondary | ICD-10-CM | POA: Diagnosis not present

## 2015-06-27 DIAGNOSIS — I509 Heart failure, unspecified: Secondary | ICD-10-CM | POA: Diagnosis not present

## 2015-06-28 DIAGNOSIS — I509 Heart failure, unspecified: Secondary | ICD-10-CM | POA: Diagnosis not present

## 2015-06-28 DIAGNOSIS — F29 Unspecified psychosis not due to a substance or known physiological condition: Secondary | ICD-10-CM | POA: Diagnosis not present

## 2015-06-28 DIAGNOSIS — M6259 Muscle wasting and atrophy, not elsewhere classified, multiple sites: Secondary | ICD-10-CM | POA: Diagnosis not present

## 2015-06-28 DIAGNOSIS — F323 Major depressive disorder, single episode, severe with psychotic features: Secondary | ICD-10-CM | POA: Diagnosis not present

## 2015-06-30 DIAGNOSIS — I509 Heart failure, unspecified: Secondary | ICD-10-CM | POA: Diagnosis not present

## 2015-06-30 DIAGNOSIS — F29 Unspecified psychosis not due to a substance or known physiological condition: Secondary | ICD-10-CM | POA: Diagnosis not present

## 2015-06-30 DIAGNOSIS — F323 Major depressive disorder, single episode, severe with psychotic features: Secondary | ICD-10-CM | POA: Diagnosis not present

## 2015-06-30 DIAGNOSIS — M6259 Muscle wasting and atrophy, not elsewhere classified, multiple sites: Secondary | ICD-10-CM | POA: Diagnosis not present

## 2015-07-02 DIAGNOSIS — F29 Unspecified psychosis not due to a substance or known physiological condition: Secondary | ICD-10-CM | POA: Diagnosis not present

## 2015-07-02 DIAGNOSIS — M6259 Muscle wasting and atrophy, not elsewhere classified, multiple sites: Secondary | ICD-10-CM | POA: Diagnosis not present

## 2015-07-02 DIAGNOSIS — I509 Heart failure, unspecified: Secondary | ICD-10-CM | POA: Diagnosis not present

## 2015-07-02 DIAGNOSIS — F323 Major depressive disorder, single episode, severe with psychotic features: Secondary | ICD-10-CM | POA: Diagnosis not present

## 2015-07-03 DIAGNOSIS — F29 Unspecified psychosis not due to a substance or known physiological condition: Secondary | ICD-10-CM | POA: Diagnosis not present

## 2015-07-03 DIAGNOSIS — I509 Heart failure, unspecified: Secondary | ICD-10-CM | POA: Diagnosis not present

## 2015-07-03 DIAGNOSIS — F323 Major depressive disorder, single episode, severe with psychotic features: Secondary | ICD-10-CM | POA: Diagnosis not present

## 2015-07-03 DIAGNOSIS — M6259 Muscle wasting and atrophy, not elsewhere classified, multiple sites: Secondary | ICD-10-CM | POA: Diagnosis not present

## 2015-07-04 DIAGNOSIS — F29 Unspecified psychosis not due to a substance or known physiological condition: Secondary | ICD-10-CM | POA: Diagnosis not present

## 2015-07-04 DIAGNOSIS — I509 Heart failure, unspecified: Secondary | ICD-10-CM | POA: Diagnosis not present

## 2015-07-04 DIAGNOSIS — M6259 Muscle wasting and atrophy, not elsewhere classified, multiple sites: Secondary | ICD-10-CM | POA: Diagnosis not present

## 2015-07-04 DIAGNOSIS — F323 Major depressive disorder, single episode, severe with psychotic features: Secondary | ICD-10-CM | POA: Diagnosis not present

## 2015-07-05 DIAGNOSIS — M6259 Muscle wasting and atrophy, not elsewhere classified, multiple sites: Secondary | ICD-10-CM | POA: Diagnosis not present

## 2015-07-05 DIAGNOSIS — F323 Major depressive disorder, single episode, severe with psychotic features: Secondary | ICD-10-CM | POA: Diagnosis not present

## 2015-07-05 DIAGNOSIS — F29 Unspecified psychosis not due to a substance or known physiological condition: Secondary | ICD-10-CM | POA: Diagnosis not present

## 2015-07-05 DIAGNOSIS — I509 Heart failure, unspecified: Secondary | ICD-10-CM | POA: Diagnosis not present

## 2015-07-07 DIAGNOSIS — I509 Heart failure, unspecified: Secondary | ICD-10-CM | POA: Diagnosis not present

## 2015-07-07 DIAGNOSIS — F323 Major depressive disorder, single episode, severe with psychotic features: Secondary | ICD-10-CM | POA: Diagnosis not present

## 2015-07-07 DIAGNOSIS — F29 Unspecified psychosis not due to a substance or known physiological condition: Secondary | ICD-10-CM | POA: Diagnosis not present

## 2015-07-07 DIAGNOSIS — M6259 Muscle wasting and atrophy, not elsewhere classified, multiple sites: Secondary | ICD-10-CM | POA: Diagnosis not present

## 2015-07-09 DIAGNOSIS — M6259 Muscle wasting and atrophy, not elsewhere classified, multiple sites: Secondary | ICD-10-CM | POA: Diagnosis not present

## 2015-07-09 DIAGNOSIS — F29 Unspecified psychosis not due to a substance or known physiological condition: Secondary | ICD-10-CM | POA: Diagnosis not present

## 2015-07-09 DIAGNOSIS — I509 Heart failure, unspecified: Secondary | ICD-10-CM | POA: Diagnosis not present

## 2015-07-09 DIAGNOSIS — F323 Major depressive disorder, single episode, severe with psychotic features: Secondary | ICD-10-CM | POA: Diagnosis not present

## 2015-07-10 DIAGNOSIS — F29 Unspecified psychosis not due to a substance or known physiological condition: Secondary | ICD-10-CM | POA: Diagnosis not present

## 2015-07-10 DIAGNOSIS — I509 Heart failure, unspecified: Secondary | ICD-10-CM | POA: Diagnosis not present

## 2015-07-10 DIAGNOSIS — M6259 Muscle wasting and atrophy, not elsewhere classified, multiple sites: Secondary | ICD-10-CM | POA: Diagnosis not present

## 2015-07-10 DIAGNOSIS — F323 Major depressive disorder, single episode, severe with psychotic features: Secondary | ICD-10-CM | POA: Diagnosis not present

## 2015-07-11 DIAGNOSIS — F29 Unspecified psychosis not due to a substance or known physiological condition: Secondary | ICD-10-CM | POA: Diagnosis not present

## 2015-07-11 DIAGNOSIS — M6259 Muscle wasting and atrophy, not elsewhere classified, multiple sites: Secondary | ICD-10-CM | POA: Diagnosis not present

## 2015-07-11 DIAGNOSIS — I509 Heart failure, unspecified: Secondary | ICD-10-CM | POA: Diagnosis not present

## 2015-07-11 DIAGNOSIS — F323 Major depressive disorder, single episode, severe with psychotic features: Secondary | ICD-10-CM | POA: Diagnosis not present

## 2015-07-12 DIAGNOSIS — I509 Heart failure, unspecified: Secondary | ICD-10-CM | POA: Diagnosis not present

## 2015-07-12 DIAGNOSIS — F323 Major depressive disorder, single episode, severe with psychotic features: Secondary | ICD-10-CM | POA: Diagnosis not present

## 2015-07-12 DIAGNOSIS — M6259 Muscle wasting and atrophy, not elsewhere classified, multiple sites: Secondary | ICD-10-CM | POA: Diagnosis not present

## 2015-07-12 DIAGNOSIS — F29 Unspecified psychosis not due to a substance or known physiological condition: Secondary | ICD-10-CM | POA: Diagnosis not present

## 2015-07-14 DIAGNOSIS — M6259 Muscle wasting and atrophy, not elsewhere classified, multiple sites: Secondary | ICD-10-CM | POA: Diagnosis not present

## 2015-07-14 DIAGNOSIS — Z008 Encounter for other general examination: Secondary | ICD-10-CM | POA: Diagnosis not present

## 2015-07-14 DIAGNOSIS — F323 Major depressive disorder, single episode, severe with psychotic features: Secondary | ICD-10-CM | POA: Diagnosis not present

## 2015-07-14 DIAGNOSIS — F29 Unspecified psychosis not due to a substance or known physiological condition: Secondary | ICD-10-CM | POA: Diagnosis not present

## 2015-07-14 DIAGNOSIS — Z5181 Encounter for therapeutic drug level monitoring: Secondary | ICD-10-CM | POA: Diagnosis not present

## 2015-07-14 DIAGNOSIS — I509 Heart failure, unspecified: Secondary | ICD-10-CM | POA: Diagnosis not present

## 2015-07-16 DIAGNOSIS — I509 Heart failure, unspecified: Secondary | ICD-10-CM | POA: Diagnosis not present

## 2015-07-16 DIAGNOSIS — M6259 Muscle wasting and atrophy, not elsewhere classified, multiple sites: Secondary | ICD-10-CM | POA: Diagnosis not present

## 2015-07-16 DIAGNOSIS — F29 Unspecified psychosis not due to a substance or known physiological condition: Secondary | ICD-10-CM | POA: Diagnosis not present

## 2015-07-16 DIAGNOSIS — F323 Major depressive disorder, single episode, severe with psychotic features: Secondary | ICD-10-CM | POA: Diagnosis not present

## 2015-07-17 DIAGNOSIS — F323 Major depressive disorder, single episode, severe with psychotic features: Secondary | ICD-10-CM | POA: Diagnosis not present

## 2015-07-17 DIAGNOSIS — I509 Heart failure, unspecified: Secondary | ICD-10-CM | POA: Diagnosis not present

## 2015-07-17 DIAGNOSIS — F29 Unspecified psychosis not due to a substance or known physiological condition: Secondary | ICD-10-CM | POA: Diagnosis not present

## 2015-07-17 DIAGNOSIS — M6259 Muscle wasting and atrophy, not elsewhere classified, multiple sites: Secondary | ICD-10-CM | POA: Diagnosis not present

## 2015-07-18 DIAGNOSIS — F29 Unspecified psychosis not due to a substance or known physiological condition: Secondary | ICD-10-CM | POA: Diagnosis not present

## 2015-07-18 DIAGNOSIS — F323 Major depressive disorder, single episode, severe with psychotic features: Secondary | ICD-10-CM | POA: Diagnosis not present

## 2015-07-18 DIAGNOSIS — M6259 Muscle wasting and atrophy, not elsewhere classified, multiple sites: Secondary | ICD-10-CM | POA: Diagnosis not present

## 2015-07-18 DIAGNOSIS — I2510 Atherosclerotic heart disease of native coronary artery without angina pectoris: Secondary | ICD-10-CM | POA: Diagnosis not present

## 2015-07-18 DIAGNOSIS — I4891 Unspecified atrial fibrillation: Secondary | ICD-10-CM | POA: Diagnosis not present

## 2015-07-18 DIAGNOSIS — G3184 Mild cognitive impairment, so stated: Secondary | ICD-10-CM | POA: Diagnosis not present

## 2015-07-18 DIAGNOSIS — I509 Heart failure, unspecified: Secondary | ICD-10-CM | POA: Diagnosis not present

## 2015-07-21 DIAGNOSIS — F39 Unspecified mood [affective] disorder: Secondary | ICD-10-CM | POA: Diagnosis not present

## 2015-07-21 DIAGNOSIS — G3184 Mild cognitive impairment, so stated: Secondary | ICD-10-CM | POA: Diagnosis not present

## 2015-07-21 DIAGNOSIS — M6259 Muscle wasting and atrophy, not elsewhere classified, multiple sites: Secondary | ICD-10-CM | POA: Diagnosis not present

## 2015-07-21 DIAGNOSIS — I509 Heart failure, unspecified: Secondary | ICD-10-CM | POA: Diagnosis not present

## 2015-07-21 DIAGNOSIS — F29 Unspecified psychosis not due to a substance or known physiological condition: Secondary | ICD-10-CM | POA: Diagnosis not present

## 2015-07-21 DIAGNOSIS — F323 Major depressive disorder, single episode, severe with psychotic features: Secondary | ICD-10-CM | POA: Diagnosis not present

## 2015-07-22 DIAGNOSIS — M6259 Muscle wasting and atrophy, not elsewhere classified, multiple sites: Secondary | ICD-10-CM | POA: Diagnosis not present

## 2015-07-22 DIAGNOSIS — I509 Heart failure, unspecified: Secondary | ICD-10-CM | POA: Diagnosis not present

## 2015-07-22 DIAGNOSIS — F29 Unspecified psychosis not due to a substance or known physiological condition: Secondary | ICD-10-CM | POA: Diagnosis not present

## 2015-07-22 DIAGNOSIS — F323 Major depressive disorder, single episode, severe with psychotic features: Secondary | ICD-10-CM | POA: Diagnosis not present

## 2015-07-23 DIAGNOSIS — M6259 Muscle wasting and atrophy, not elsewhere classified, multiple sites: Secondary | ICD-10-CM | POA: Diagnosis not present

## 2015-07-23 DIAGNOSIS — F323 Major depressive disorder, single episode, severe with psychotic features: Secondary | ICD-10-CM | POA: Diagnosis not present

## 2015-07-23 DIAGNOSIS — F29 Unspecified psychosis not due to a substance or known physiological condition: Secondary | ICD-10-CM | POA: Diagnosis not present

## 2015-07-23 DIAGNOSIS — I509 Heart failure, unspecified: Secondary | ICD-10-CM | POA: Diagnosis not present

## 2015-07-24 DIAGNOSIS — M6259 Muscle wasting and atrophy, not elsewhere classified, multiple sites: Secondary | ICD-10-CM | POA: Diagnosis not present

## 2015-07-24 DIAGNOSIS — F323 Major depressive disorder, single episode, severe with psychotic features: Secondary | ICD-10-CM | POA: Diagnosis not present

## 2015-07-24 DIAGNOSIS — I509 Heart failure, unspecified: Secondary | ICD-10-CM | POA: Diagnosis not present

## 2015-07-24 DIAGNOSIS — F29 Unspecified psychosis not due to a substance or known physiological condition: Secondary | ICD-10-CM | POA: Diagnosis not present

## 2015-07-25 DIAGNOSIS — M6259 Muscle wasting and atrophy, not elsewhere classified, multiple sites: Secondary | ICD-10-CM | POA: Diagnosis not present

## 2015-07-25 DIAGNOSIS — I509 Heart failure, unspecified: Secondary | ICD-10-CM | POA: Diagnosis not present

## 2015-07-25 DIAGNOSIS — F323 Major depressive disorder, single episode, severe with psychotic features: Secondary | ICD-10-CM | POA: Diagnosis not present

## 2015-07-25 DIAGNOSIS — F29 Unspecified psychosis not due to a substance or known physiological condition: Secondary | ICD-10-CM | POA: Diagnosis not present

## 2015-07-28 DIAGNOSIS — I509 Heart failure, unspecified: Secondary | ICD-10-CM | POA: Diagnosis not present

## 2015-07-28 DIAGNOSIS — M6259 Muscle wasting and atrophy, not elsewhere classified, multiple sites: Secondary | ICD-10-CM | POA: Diagnosis not present

## 2015-07-28 DIAGNOSIS — F29 Unspecified psychosis not due to a substance or known physiological condition: Secondary | ICD-10-CM | POA: Diagnosis not present

## 2015-07-28 DIAGNOSIS — F323 Major depressive disorder, single episode, severe with psychotic features: Secondary | ICD-10-CM | POA: Diagnosis not present

## 2015-07-29 DIAGNOSIS — I509 Heart failure, unspecified: Secondary | ICD-10-CM | POA: Diagnosis not present

## 2015-07-29 DIAGNOSIS — F29 Unspecified psychosis not due to a substance or known physiological condition: Secondary | ICD-10-CM | POA: Diagnosis not present

## 2015-07-29 DIAGNOSIS — M6259 Muscle wasting and atrophy, not elsewhere classified, multiple sites: Secondary | ICD-10-CM | POA: Diagnosis not present

## 2015-07-29 DIAGNOSIS — F323 Major depressive disorder, single episode, severe with psychotic features: Secondary | ICD-10-CM | POA: Diagnosis not present

## 2015-07-30 DIAGNOSIS — F29 Unspecified psychosis not due to a substance or known physiological condition: Secondary | ICD-10-CM | POA: Diagnosis not present

## 2015-07-30 DIAGNOSIS — I509 Heart failure, unspecified: Secondary | ICD-10-CM | POA: Diagnosis not present

## 2015-07-30 DIAGNOSIS — F323 Major depressive disorder, single episode, severe with psychotic features: Secondary | ICD-10-CM | POA: Diagnosis not present

## 2015-07-30 DIAGNOSIS — M6259 Muscle wasting and atrophy, not elsewhere classified, multiple sites: Secondary | ICD-10-CM | POA: Diagnosis not present

## 2015-07-31 DIAGNOSIS — F29 Unspecified psychosis not due to a substance or known physiological condition: Secondary | ICD-10-CM | POA: Diagnosis not present

## 2015-07-31 DIAGNOSIS — F323 Major depressive disorder, single episode, severe with psychotic features: Secondary | ICD-10-CM | POA: Diagnosis not present

## 2015-07-31 DIAGNOSIS — I509 Heart failure, unspecified: Secondary | ICD-10-CM | POA: Diagnosis not present

## 2015-07-31 DIAGNOSIS — M6259 Muscle wasting and atrophy, not elsewhere classified, multiple sites: Secondary | ICD-10-CM | POA: Diagnosis not present

## 2015-08-01 DIAGNOSIS — I509 Heart failure, unspecified: Secondary | ICD-10-CM | POA: Diagnosis not present

## 2015-08-01 DIAGNOSIS — M6259 Muscle wasting and atrophy, not elsewhere classified, multiple sites: Secondary | ICD-10-CM | POA: Diagnosis not present

## 2015-08-01 DIAGNOSIS — F323 Major depressive disorder, single episode, severe with psychotic features: Secondary | ICD-10-CM | POA: Diagnosis not present

## 2015-08-01 DIAGNOSIS — F29 Unspecified psychosis not due to a substance or known physiological condition: Secondary | ICD-10-CM | POA: Diagnosis not present

## 2015-08-04 DIAGNOSIS — F29 Unspecified psychosis not due to a substance or known physiological condition: Secondary | ICD-10-CM | POA: Diagnosis not present

## 2015-08-04 DIAGNOSIS — F323 Major depressive disorder, single episode, severe with psychotic features: Secondary | ICD-10-CM | POA: Diagnosis not present

## 2015-08-04 DIAGNOSIS — M6259 Muscle wasting and atrophy, not elsewhere classified, multiple sites: Secondary | ICD-10-CM | POA: Diagnosis not present

## 2015-08-04 DIAGNOSIS — I509 Heart failure, unspecified: Secondary | ICD-10-CM | POA: Diagnosis not present

## 2015-08-05 DIAGNOSIS — F29 Unspecified psychosis not due to a substance or known physiological condition: Secondary | ICD-10-CM | POA: Diagnosis not present

## 2015-08-05 DIAGNOSIS — F323 Major depressive disorder, single episode, severe with psychotic features: Secondary | ICD-10-CM | POA: Diagnosis not present

## 2015-08-05 DIAGNOSIS — M6259 Muscle wasting and atrophy, not elsewhere classified, multiple sites: Secondary | ICD-10-CM | POA: Diagnosis not present

## 2015-08-05 DIAGNOSIS — I509 Heart failure, unspecified: Secondary | ICD-10-CM | POA: Diagnosis not present

## 2015-08-06 DIAGNOSIS — I509 Heart failure, unspecified: Secondary | ICD-10-CM | POA: Diagnosis not present

## 2015-08-06 DIAGNOSIS — F323 Major depressive disorder, single episode, severe with psychotic features: Secondary | ICD-10-CM | POA: Diagnosis not present

## 2015-08-06 DIAGNOSIS — F29 Unspecified psychosis not due to a substance or known physiological condition: Secondary | ICD-10-CM | POA: Diagnosis not present

## 2015-08-06 DIAGNOSIS — M6259 Muscle wasting and atrophy, not elsewhere classified, multiple sites: Secondary | ICD-10-CM | POA: Diagnosis not present

## 2015-08-08 DIAGNOSIS — F29 Unspecified psychosis not due to a substance or known physiological condition: Secondary | ICD-10-CM | POA: Diagnosis not present

## 2015-08-08 DIAGNOSIS — F323 Major depressive disorder, single episode, severe with psychotic features: Secondary | ICD-10-CM | POA: Diagnosis not present

## 2015-08-08 DIAGNOSIS — I509 Heart failure, unspecified: Secondary | ICD-10-CM | POA: Diagnosis not present

## 2015-08-08 DIAGNOSIS — M6259 Muscle wasting and atrophy, not elsewhere classified, multiple sites: Secondary | ICD-10-CM | POA: Diagnosis not present

## 2015-08-11 DIAGNOSIS — I509 Heart failure, unspecified: Secondary | ICD-10-CM | POA: Diagnosis not present

## 2015-08-11 DIAGNOSIS — F323 Major depressive disorder, single episode, severe with psychotic features: Secondary | ICD-10-CM | POA: Diagnosis not present

## 2015-08-11 DIAGNOSIS — F29 Unspecified psychosis not due to a substance or known physiological condition: Secondary | ICD-10-CM | POA: Diagnosis not present

## 2015-08-11 DIAGNOSIS — M6259 Muscle wasting and atrophy, not elsewhere classified, multiple sites: Secondary | ICD-10-CM | POA: Diagnosis not present

## 2015-08-12 DIAGNOSIS — M6259 Muscle wasting and atrophy, not elsewhere classified, multiple sites: Secondary | ICD-10-CM | POA: Diagnosis not present

## 2015-08-12 DIAGNOSIS — F29 Unspecified psychosis not due to a substance or known physiological condition: Secondary | ICD-10-CM | POA: Diagnosis not present

## 2015-08-12 DIAGNOSIS — F323 Major depressive disorder, single episode, severe with psychotic features: Secondary | ICD-10-CM | POA: Diagnosis not present

## 2015-08-12 DIAGNOSIS — I509 Heart failure, unspecified: Secondary | ICD-10-CM | POA: Diagnosis not present

## 2015-08-13 DIAGNOSIS — M6259 Muscle wasting and atrophy, not elsewhere classified, multiple sites: Secondary | ICD-10-CM | POA: Diagnosis not present

## 2015-08-13 DIAGNOSIS — F323 Major depressive disorder, single episode, severe with psychotic features: Secondary | ICD-10-CM | POA: Diagnosis not present

## 2015-08-13 DIAGNOSIS — I509 Heart failure, unspecified: Secondary | ICD-10-CM | POA: Diagnosis not present

## 2015-08-13 DIAGNOSIS — F29 Unspecified psychosis not due to a substance or known physiological condition: Secondary | ICD-10-CM | POA: Diagnosis not present

## 2015-08-14 DIAGNOSIS — M6259 Muscle wasting and atrophy, not elsewhere classified, multiple sites: Secondary | ICD-10-CM | POA: Diagnosis not present

## 2015-08-14 DIAGNOSIS — I509 Heart failure, unspecified: Secondary | ICD-10-CM | POA: Diagnosis not present

## 2015-08-14 DIAGNOSIS — F323 Major depressive disorder, single episode, severe with psychotic features: Secondary | ICD-10-CM | POA: Diagnosis not present

## 2015-08-14 DIAGNOSIS — F29 Unspecified psychosis not due to a substance or known physiological condition: Secondary | ICD-10-CM | POA: Diagnosis not present

## 2015-10-13 DIAGNOSIS — D649 Anemia, unspecified: Secondary | ICD-10-CM | POA: Diagnosis not present

## 2015-10-13 DIAGNOSIS — Z5181 Encounter for therapeutic drug level monitoring: Secondary | ICD-10-CM | POA: Diagnosis not present

## 2015-10-13 DIAGNOSIS — F39 Unspecified mood [affective] disorder: Secondary | ICD-10-CM | POA: Diagnosis not present

## 2015-10-13 DIAGNOSIS — F323 Major depressive disorder, single episode, severe with psychotic features: Secondary | ICD-10-CM | POA: Diagnosis not present

## 2015-10-13 DIAGNOSIS — G3184 Mild cognitive impairment, so stated: Secondary | ICD-10-CM | POA: Diagnosis not present

## 2015-10-13 DIAGNOSIS — E785 Hyperlipidemia, unspecified: Secondary | ICD-10-CM | POA: Diagnosis not present

## 2015-10-13 DIAGNOSIS — I10 Essential (primary) hypertension: Secondary | ICD-10-CM | POA: Diagnosis not present

## 2015-10-13 DIAGNOSIS — Z008 Encounter for other general examination: Secondary | ICD-10-CM | POA: Diagnosis not present

## 2015-10-17 DIAGNOSIS — M79671 Pain in right foot: Secondary | ICD-10-CM | POA: Diagnosis not present

## 2015-10-17 DIAGNOSIS — M79672 Pain in left foot: Secondary | ICD-10-CM | POA: Diagnosis not present

## 2015-10-17 DIAGNOSIS — Z23 Encounter for immunization: Secondary | ICD-10-CM | POA: Diagnosis not present

## 2015-10-17 DIAGNOSIS — B351 Tinea unguium: Secondary | ICD-10-CM | POA: Diagnosis not present

## 2015-10-24 DIAGNOSIS — G3184 Mild cognitive impairment, so stated: Secondary | ICD-10-CM | POA: Diagnosis not present

## 2015-10-24 DIAGNOSIS — I4891 Unspecified atrial fibrillation: Secondary | ICD-10-CM | POA: Diagnosis not present

## 2015-10-24 DIAGNOSIS — I2510 Atherosclerotic heart disease of native coronary artery without angina pectoris: Secondary | ICD-10-CM | POA: Diagnosis not present

## 2015-10-24 DIAGNOSIS — I509 Heart failure, unspecified: Secondary | ICD-10-CM | POA: Diagnosis not present

## 2015-11-06 ENCOUNTER — Other Ambulatory Visit: Payer: Self-pay | Admitting: Gynecologic Oncology

## 2015-11-14 DIAGNOSIS — I5032 Chronic diastolic (congestive) heart failure: Secondary | ICD-10-CM | POA: Diagnosis not present

## 2015-11-14 DIAGNOSIS — F419 Anxiety disorder, unspecified: Secondary | ICD-10-CM | POA: Diagnosis not present

## 2015-11-14 DIAGNOSIS — F329 Major depressive disorder, single episode, unspecified: Secondary | ICD-10-CM | POA: Diagnosis not present

## 2015-11-14 DIAGNOSIS — I10 Essential (primary) hypertension: Secondary | ICD-10-CM | POA: Diagnosis not present

## 2015-11-14 DIAGNOSIS — I4891 Unspecified atrial fibrillation: Secondary | ICD-10-CM | POA: Diagnosis not present

## 2015-11-19 DIAGNOSIS — I482 Chronic atrial fibrillation: Secondary | ICD-10-CM | POA: Diagnosis not present

## 2015-11-19 DIAGNOSIS — I5032 Chronic diastolic (congestive) heart failure: Secondary | ICD-10-CM | POA: Diagnosis not present

## 2015-11-19 DIAGNOSIS — R262 Difficulty in walking, not elsewhere classified: Secondary | ICD-10-CM | POA: Diagnosis not present

## 2015-11-19 DIAGNOSIS — M170 Bilateral primary osteoarthritis of knee: Secondary | ICD-10-CM | POA: Diagnosis not present

## 2015-11-19 DIAGNOSIS — M6281 Muscle weakness (generalized): Secondary | ICD-10-CM | POA: Diagnosis not present

## 2015-11-19 DIAGNOSIS — F419 Anxiety disorder, unspecified: Secondary | ICD-10-CM | POA: Diagnosis not present

## 2015-11-19 DIAGNOSIS — F39 Unspecified mood [affective] disorder: Secondary | ICD-10-CM | POA: Diagnosis not present

## 2015-11-19 DIAGNOSIS — F329 Major depressive disorder, single episode, unspecified: Secondary | ICD-10-CM | POA: Diagnosis not present

## 2015-11-19 DIAGNOSIS — I4891 Unspecified atrial fibrillation: Secondary | ICD-10-CM | POA: Diagnosis not present

## 2015-11-19 DIAGNOSIS — Z7189 Other specified counseling: Secondary | ICD-10-CM | POA: Diagnosis not present

## 2015-11-21 DIAGNOSIS — I4891 Unspecified atrial fibrillation: Secondary | ICD-10-CM | POA: Diagnosis not present

## 2015-11-21 DIAGNOSIS — R262 Difficulty in walking, not elsewhere classified: Secondary | ICD-10-CM | POA: Diagnosis not present

## 2015-11-21 DIAGNOSIS — F419 Anxiety disorder, unspecified: Secondary | ICD-10-CM | POA: Diagnosis not present

## 2015-11-21 DIAGNOSIS — F329 Major depressive disorder, single episode, unspecified: Secondary | ICD-10-CM | POA: Diagnosis not present

## 2015-11-21 DIAGNOSIS — M6281 Muscle weakness (generalized): Secondary | ICD-10-CM | POA: Diagnosis not present

## 2015-11-23 DIAGNOSIS — R262 Difficulty in walking, not elsewhere classified: Secondary | ICD-10-CM | POA: Diagnosis not present

## 2015-11-23 DIAGNOSIS — F419 Anxiety disorder, unspecified: Secondary | ICD-10-CM | POA: Diagnosis not present

## 2015-11-23 DIAGNOSIS — M6281 Muscle weakness (generalized): Secondary | ICD-10-CM | POA: Diagnosis not present

## 2015-11-23 DIAGNOSIS — F329 Major depressive disorder, single episode, unspecified: Secondary | ICD-10-CM | POA: Diagnosis not present

## 2015-11-23 DIAGNOSIS — I4891 Unspecified atrial fibrillation: Secondary | ICD-10-CM | POA: Diagnosis not present

## 2015-11-25 DIAGNOSIS — M6281 Muscle weakness (generalized): Secondary | ICD-10-CM | POA: Diagnosis not present

## 2015-11-25 DIAGNOSIS — F419 Anxiety disorder, unspecified: Secondary | ICD-10-CM | POA: Diagnosis not present

## 2015-11-25 DIAGNOSIS — R262 Difficulty in walking, not elsewhere classified: Secondary | ICD-10-CM | POA: Diagnosis not present

## 2015-11-25 DIAGNOSIS — F329 Major depressive disorder, single episode, unspecified: Secondary | ICD-10-CM | POA: Diagnosis not present

## 2015-11-25 DIAGNOSIS — I4891 Unspecified atrial fibrillation: Secondary | ICD-10-CM | POA: Diagnosis not present

## 2015-11-26 DIAGNOSIS — M170 Bilateral primary osteoarthritis of knee: Secondary | ICD-10-CM | POA: Diagnosis not present

## 2015-11-26 DIAGNOSIS — F39 Unspecified mood [affective] disorder: Secondary | ICD-10-CM | POA: Diagnosis not present

## 2015-11-26 DIAGNOSIS — I482 Chronic atrial fibrillation: Secondary | ICD-10-CM | POA: Diagnosis not present

## 2015-11-26 DIAGNOSIS — I5032 Chronic diastolic (congestive) heart failure: Secondary | ICD-10-CM | POA: Diagnosis not present

## 2015-11-26 DIAGNOSIS — Z7189 Other specified counseling: Secondary | ICD-10-CM | POA: Diagnosis not present

## 2015-11-27 DIAGNOSIS — R262 Difficulty in walking, not elsewhere classified: Secondary | ICD-10-CM | POA: Diagnosis not present

## 2015-11-27 DIAGNOSIS — F419 Anxiety disorder, unspecified: Secondary | ICD-10-CM | POA: Diagnosis not present

## 2015-11-27 DIAGNOSIS — M6281 Muscle weakness (generalized): Secondary | ICD-10-CM | POA: Diagnosis not present

## 2015-11-27 DIAGNOSIS — I4891 Unspecified atrial fibrillation: Secondary | ICD-10-CM | POA: Diagnosis not present

## 2015-11-27 DIAGNOSIS — F329 Major depressive disorder, single episode, unspecified: Secondary | ICD-10-CM | POA: Diagnosis not present

## 2015-11-28 DIAGNOSIS — I4891 Unspecified atrial fibrillation: Secondary | ICD-10-CM | POA: Diagnosis not present

## 2015-11-28 DIAGNOSIS — F419 Anxiety disorder, unspecified: Secondary | ICD-10-CM | POA: Diagnosis not present

## 2015-11-28 DIAGNOSIS — R262 Difficulty in walking, not elsewhere classified: Secondary | ICD-10-CM | POA: Diagnosis not present

## 2015-11-28 DIAGNOSIS — M6281 Muscle weakness (generalized): Secondary | ICD-10-CM | POA: Diagnosis not present

## 2015-11-28 DIAGNOSIS — F329 Major depressive disorder, single episode, unspecified: Secondary | ICD-10-CM | POA: Diagnosis not present

## 2015-12-01 DIAGNOSIS — I4891 Unspecified atrial fibrillation: Secondary | ICD-10-CM | POA: Diagnosis not present

## 2015-12-01 DIAGNOSIS — M6281 Muscle weakness (generalized): Secondary | ICD-10-CM | POA: Diagnosis not present

## 2015-12-01 DIAGNOSIS — R262 Difficulty in walking, not elsewhere classified: Secondary | ICD-10-CM | POA: Diagnosis not present

## 2015-12-01 DIAGNOSIS — F329 Major depressive disorder, single episode, unspecified: Secondary | ICD-10-CM | POA: Diagnosis not present

## 2015-12-01 DIAGNOSIS — F419 Anxiety disorder, unspecified: Secondary | ICD-10-CM | POA: Diagnosis not present

## 2015-12-02 DIAGNOSIS — L719 Rosacea, unspecified: Secondary | ICD-10-CM | POA: Diagnosis not present

## 2015-12-02 DIAGNOSIS — R262 Difficulty in walking, not elsewhere classified: Secondary | ICD-10-CM | POA: Diagnosis not present

## 2015-12-02 DIAGNOSIS — R112 Nausea with vomiting, unspecified: Secondary | ICD-10-CM | POA: Diagnosis not present

## 2015-12-02 DIAGNOSIS — F419 Anxiety disorder, unspecified: Secondary | ICD-10-CM | POA: Diagnosis not present

## 2015-12-02 DIAGNOSIS — M6281 Muscle weakness (generalized): Secondary | ICD-10-CM | POA: Diagnosis not present

## 2015-12-02 DIAGNOSIS — R5383 Other fatigue: Secondary | ICD-10-CM | POA: Diagnosis not present

## 2015-12-02 DIAGNOSIS — R195 Other fecal abnormalities: Secondary | ICD-10-CM | POA: Diagnosis not present

## 2015-12-02 DIAGNOSIS — I482 Chronic atrial fibrillation: Secondary | ICD-10-CM | POA: Diagnosis not present

## 2015-12-02 DIAGNOSIS — I5032 Chronic diastolic (congestive) heart failure: Secondary | ICD-10-CM | POA: Diagnosis not present

## 2015-12-02 DIAGNOSIS — F329 Major depressive disorder, single episode, unspecified: Secondary | ICD-10-CM | POA: Diagnosis not present

## 2015-12-02 DIAGNOSIS — I4891 Unspecified atrial fibrillation: Secondary | ICD-10-CM | POA: Diagnosis not present

## 2015-12-03 DIAGNOSIS — M6281 Muscle weakness (generalized): Secondary | ICD-10-CM | POA: Diagnosis not present

## 2015-12-03 DIAGNOSIS — F329 Major depressive disorder, single episode, unspecified: Secondary | ICD-10-CM | POA: Diagnosis not present

## 2015-12-03 DIAGNOSIS — I4891 Unspecified atrial fibrillation: Secondary | ICD-10-CM | POA: Diagnosis not present

## 2015-12-03 DIAGNOSIS — R262 Difficulty in walking, not elsewhere classified: Secondary | ICD-10-CM | POA: Diagnosis not present

## 2015-12-03 DIAGNOSIS — F419 Anxiety disorder, unspecified: Secondary | ICD-10-CM | POA: Diagnosis not present

## 2015-12-04 DIAGNOSIS — R195 Other fecal abnormalities: Secondary | ICD-10-CM | POA: Diagnosis not present

## 2015-12-04 DIAGNOSIS — I5032 Chronic diastolic (congestive) heart failure: Secondary | ICD-10-CM | POA: Diagnosis not present

## 2015-12-04 DIAGNOSIS — R112 Nausea with vomiting, unspecified: Secondary | ICD-10-CM | POA: Diagnosis not present

## 2015-12-04 DIAGNOSIS — A047 Enterocolitis due to Clostridium difficile: Secondary | ICD-10-CM | POA: Diagnosis not present

## 2015-12-04 DIAGNOSIS — E039 Hypothyroidism, unspecified: Secondary | ICD-10-CM | POA: Diagnosis not present

## 2015-12-04 DIAGNOSIS — R5383 Other fatigue: Secondary | ICD-10-CM | POA: Diagnosis not present

## 2015-12-04 DIAGNOSIS — L719 Rosacea, unspecified: Secondary | ICD-10-CM | POA: Diagnosis not present

## 2015-12-04 DIAGNOSIS — I5042 Chronic combined systolic (congestive) and diastolic (congestive) heart failure: Secondary | ICD-10-CM | POA: Diagnosis not present

## 2015-12-04 DIAGNOSIS — I482 Chronic atrial fibrillation: Secondary | ICD-10-CM | POA: Diagnosis not present

## 2015-12-05 DIAGNOSIS — Z452 Encounter for adjustment and management of vascular access device: Secondary | ICD-10-CM | POA: Diagnosis not present

## 2015-12-09 DIAGNOSIS — F419 Anxiety disorder, unspecified: Secondary | ICD-10-CM | POA: Diagnosis not present

## 2015-12-09 DIAGNOSIS — R262 Difficulty in walking, not elsewhere classified: Secondary | ICD-10-CM | POA: Diagnosis not present

## 2015-12-09 DIAGNOSIS — I4891 Unspecified atrial fibrillation: Secondary | ICD-10-CM | POA: Diagnosis not present

## 2015-12-09 DIAGNOSIS — F329 Major depressive disorder, single episode, unspecified: Secondary | ICD-10-CM | POA: Diagnosis not present

## 2015-12-09 DIAGNOSIS — M6281 Muscle weakness (generalized): Secondary | ICD-10-CM | POA: Diagnosis not present

## 2015-12-10 DIAGNOSIS — F329 Major depressive disorder, single episode, unspecified: Secondary | ICD-10-CM | POA: Diagnosis not present

## 2015-12-10 DIAGNOSIS — I4891 Unspecified atrial fibrillation: Secondary | ICD-10-CM | POA: Diagnosis not present

## 2015-12-10 DIAGNOSIS — R262 Difficulty in walking, not elsewhere classified: Secondary | ICD-10-CM | POA: Diagnosis not present

## 2015-12-10 DIAGNOSIS — M6281 Muscle weakness (generalized): Secondary | ICD-10-CM | POA: Diagnosis not present

## 2015-12-10 DIAGNOSIS — F419 Anxiety disorder, unspecified: Secondary | ICD-10-CM | POA: Diagnosis not present

## 2015-12-11 DIAGNOSIS — R262 Difficulty in walking, not elsewhere classified: Secondary | ICD-10-CM | POA: Diagnosis not present

## 2015-12-11 DIAGNOSIS — I482 Chronic atrial fibrillation: Secondary | ICD-10-CM | POA: Diagnosis not present

## 2015-12-11 DIAGNOSIS — R195 Other fecal abnormalities: Secondary | ICD-10-CM | POA: Diagnosis not present

## 2015-12-11 DIAGNOSIS — I4891 Unspecified atrial fibrillation: Secondary | ICD-10-CM | POA: Diagnosis not present

## 2015-12-11 DIAGNOSIS — M6281 Muscle weakness (generalized): Secondary | ICD-10-CM | POA: Diagnosis not present

## 2015-12-11 DIAGNOSIS — F329 Major depressive disorder, single episode, unspecified: Secondary | ICD-10-CM | POA: Diagnosis not present

## 2015-12-11 DIAGNOSIS — I5032 Chronic diastolic (congestive) heart failure: Secondary | ICD-10-CM | POA: Diagnosis not present

## 2015-12-11 DIAGNOSIS — N390 Urinary tract infection, site not specified: Secondary | ICD-10-CM | POA: Diagnosis not present

## 2015-12-11 DIAGNOSIS — L719 Rosacea, unspecified: Secondary | ICD-10-CM | POA: Diagnosis not present

## 2015-12-11 DIAGNOSIS — R5383 Other fatigue: Secondary | ICD-10-CM | POA: Diagnosis not present

## 2015-12-11 DIAGNOSIS — R112 Nausea with vomiting, unspecified: Secondary | ICD-10-CM | POA: Diagnosis not present

## 2015-12-11 DIAGNOSIS — F419 Anxiety disorder, unspecified: Secondary | ICD-10-CM | POA: Diagnosis not present

## 2015-12-13 DIAGNOSIS — F419 Anxiety disorder, unspecified: Secondary | ICD-10-CM | POA: Diagnosis not present

## 2015-12-13 DIAGNOSIS — R262 Difficulty in walking, not elsewhere classified: Secondary | ICD-10-CM | POA: Diagnosis not present

## 2015-12-13 DIAGNOSIS — I4891 Unspecified atrial fibrillation: Secondary | ICD-10-CM | POA: Diagnosis not present

## 2015-12-13 DIAGNOSIS — M6281 Muscle weakness (generalized): Secondary | ICD-10-CM | POA: Diagnosis not present

## 2015-12-13 DIAGNOSIS — F329 Major depressive disorder, single episode, unspecified: Secondary | ICD-10-CM | POA: Diagnosis not present

## 2015-12-15 DIAGNOSIS — R262 Difficulty in walking, not elsewhere classified: Secondary | ICD-10-CM | POA: Diagnosis not present

## 2015-12-15 DIAGNOSIS — I4891 Unspecified atrial fibrillation: Secondary | ICD-10-CM | POA: Diagnosis not present

## 2015-12-15 DIAGNOSIS — M6281 Muscle weakness (generalized): Secondary | ICD-10-CM | POA: Diagnosis not present

## 2015-12-15 DIAGNOSIS — B3749 Other urogenital candidiasis: Secondary | ICD-10-CM | POA: Diagnosis not present

## 2015-12-15 DIAGNOSIS — F419 Anxiety disorder, unspecified: Secondary | ICD-10-CM | POA: Diagnosis not present

## 2015-12-15 DIAGNOSIS — F329 Major depressive disorder, single episode, unspecified: Secondary | ICD-10-CM | POA: Diagnosis not present

## 2015-12-15 DIAGNOSIS — R270 Ataxia, unspecified: Secondary | ICD-10-CM | POA: Diagnosis not present

## 2015-12-17 DIAGNOSIS — R262 Difficulty in walking, not elsewhere classified: Secondary | ICD-10-CM | POA: Diagnosis not present

## 2015-12-17 DIAGNOSIS — F419 Anxiety disorder, unspecified: Secondary | ICD-10-CM | POA: Diagnosis not present

## 2015-12-17 DIAGNOSIS — I4891 Unspecified atrial fibrillation: Secondary | ICD-10-CM | POA: Diagnosis not present

## 2015-12-17 DIAGNOSIS — F329 Major depressive disorder, single episode, unspecified: Secondary | ICD-10-CM | POA: Diagnosis not present

## 2015-12-17 DIAGNOSIS — M6281 Muscle weakness (generalized): Secondary | ICD-10-CM | POA: Diagnosis not present

## 2015-12-18 DIAGNOSIS — M6281 Muscle weakness (generalized): Secondary | ICD-10-CM | POA: Diagnosis not present

## 2015-12-18 DIAGNOSIS — F329 Major depressive disorder, single episode, unspecified: Secondary | ICD-10-CM | POA: Diagnosis not present

## 2015-12-18 DIAGNOSIS — R262 Difficulty in walking, not elsewhere classified: Secondary | ICD-10-CM | POA: Diagnosis not present

## 2015-12-18 DIAGNOSIS — F419 Anxiety disorder, unspecified: Secondary | ICD-10-CM | POA: Diagnosis not present

## 2015-12-18 DIAGNOSIS — I4891 Unspecified atrial fibrillation: Secondary | ICD-10-CM | POA: Diagnosis not present

## 2015-12-19 DIAGNOSIS — I4891 Unspecified atrial fibrillation: Secondary | ICD-10-CM | POA: Diagnosis not present

## 2015-12-19 DIAGNOSIS — F329 Major depressive disorder, single episode, unspecified: Secondary | ICD-10-CM | POA: Diagnosis not present

## 2015-12-19 DIAGNOSIS — M6281 Muscle weakness (generalized): Secondary | ICD-10-CM | POA: Diagnosis not present

## 2015-12-19 DIAGNOSIS — F419 Anxiety disorder, unspecified: Secondary | ICD-10-CM | POA: Diagnosis not present

## 2015-12-19 DIAGNOSIS — R262 Difficulty in walking, not elsewhere classified: Secondary | ICD-10-CM | POA: Diagnosis not present

## 2015-12-19 DIAGNOSIS — N390 Urinary tract infection, site not specified: Secondary | ICD-10-CM | POA: Diagnosis not present

## 2015-12-23 DIAGNOSIS — R262 Difficulty in walking, not elsewhere classified: Secondary | ICD-10-CM | POA: Diagnosis not present

## 2015-12-23 DIAGNOSIS — I10 Essential (primary) hypertension: Secondary | ICD-10-CM | POA: Diagnosis not present

## 2015-12-23 DIAGNOSIS — F419 Anxiety disorder, unspecified: Secondary | ICD-10-CM | POA: Diagnosis not present

## 2015-12-23 DIAGNOSIS — M6281 Muscle weakness (generalized): Secondary | ICD-10-CM | POA: Diagnosis not present

## 2015-12-23 DIAGNOSIS — F329 Major depressive disorder, single episode, unspecified: Secondary | ICD-10-CM | POA: Diagnosis not present

## 2015-12-23 DIAGNOSIS — I4891 Unspecified atrial fibrillation: Secondary | ICD-10-CM | POA: Diagnosis not present

## 2015-12-23 DIAGNOSIS — N390 Urinary tract infection, site not specified: Secondary | ICD-10-CM | POA: Diagnosis not present

## 2015-12-31 DIAGNOSIS — L719 Rosacea, unspecified: Secondary | ICD-10-CM | POA: Diagnosis not present

## 2015-12-31 DIAGNOSIS — R5383 Other fatigue: Secondary | ICD-10-CM | POA: Diagnosis not present

## 2015-12-31 DIAGNOSIS — F39 Unspecified mood [affective] disorder: Secondary | ICD-10-CM | POA: Diagnosis not present

## 2016-01-01 DIAGNOSIS — F39 Unspecified mood [affective] disorder: Secondary | ICD-10-CM | POA: Diagnosis not present

## 2016-01-01 DIAGNOSIS — F323 Major depressive disorder, single episode, severe with psychotic features: Secondary | ICD-10-CM | POA: Diagnosis not present

## 2016-01-01 DIAGNOSIS — G3184 Mild cognitive impairment, so stated: Secondary | ICD-10-CM | POA: Diagnosis not present

## 2016-01-02 ENCOUNTER — Encounter: Payer: Self-pay | Admitting: Emergency Medicine

## 2016-01-02 ENCOUNTER — Emergency Department
Admission: EM | Admit: 2016-01-02 | Discharge: 2016-01-07 | Disposition: A | Discharge status: Home / Self Care | Payer: Medicare Other | Attending: Student | Admitting: Student

## 2016-01-02 ENCOUNTER — Emergency Department: Payer: Medicare Other

## 2016-01-02 DIAGNOSIS — S0990XA Unspecified injury of head, initial encounter: Secondary | ICD-10-CM | POA: Diagnosis not present

## 2016-01-02 DIAGNOSIS — Z79899 Other long term (current) drug therapy: Secondary | ICD-10-CM | POA: Diagnosis not present

## 2016-01-02 DIAGNOSIS — F29 Unspecified psychosis not due to a substance or known physiological condition: Secondary | ICD-10-CM | POA: Diagnosis not present

## 2016-01-02 DIAGNOSIS — S199XXA Unspecified injury of neck, initial encounter: Secondary | ICD-10-CM | POA: Diagnosis not present

## 2016-01-02 DIAGNOSIS — Z880 Allergy status to penicillin: Secondary | ICD-10-CM | POA: Diagnosis not present

## 2016-01-02 DIAGNOSIS — W01198A Fall on same level from slipping, tripping and stumbling with subsequent striking against other object, initial encounter: Secondary | ICD-10-CM | POA: Insufficient documentation

## 2016-01-02 DIAGNOSIS — Z7982 Long term (current) use of aspirin: Secondary | ICD-10-CM | POA: Insufficient documentation

## 2016-01-02 DIAGNOSIS — Z792 Long term (current) use of antibiotics: Secondary | ICD-10-CM | POA: Diagnosis not present

## 2016-01-02 DIAGNOSIS — F0392 Unspecified dementia, unspecified severity, with psychotic disturbance: Secondary | ICD-10-CM

## 2016-01-02 DIAGNOSIS — W19XXXA Unspecified fall, initial encounter: Secondary | ICD-10-CM | POA: Diagnosis not present

## 2016-01-02 DIAGNOSIS — S0083XA Contusion of other part of head, initial encounter: Secondary | ICD-10-CM | POA: Diagnosis not present

## 2016-01-02 DIAGNOSIS — Y92128 Other place in nursing home as the place of occurrence of the external cause: Secondary | ICD-10-CM | POA: Diagnosis not present

## 2016-01-02 DIAGNOSIS — Y9389 Activity, other specified: Secondary | ICD-10-CM | POA: Diagnosis not present

## 2016-01-02 DIAGNOSIS — I10 Essential (primary) hypertension: Secondary | ICD-10-CM | POA: Insufficient documentation

## 2016-01-02 DIAGNOSIS — R0789 Other chest pain: Secondary | ICD-10-CM | POA: Diagnosis not present

## 2016-01-02 DIAGNOSIS — S29001A Unspecified injury of muscle and tendon of front wall of thorax, initial encounter: Secondary | ICD-10-CM | POA: Diagnosis not present

## 2016-01-02 DIAGNOSIS — M542 Cervicalgia: Secondary | ICD-10-CM | POA: Diagnosis not present

## 2016-01-02 DIAGNOSIS — S299XXA Unspecified injury of thorax, initial encounter: Secondary | ICD-10-CM | POA: Diagnosis not present

## 2016-01-02 DIAGNOSIS — F0391 Unspecified dementia with behavioral disturbance: Secondary | ICD-10-CM

## 2016-01-02 DIAGNOSIS — S0003XA Contusion of scalp, initial encounter: Secondary | ICD-10-CM | POA: Diagnosis not present

## 2016-01-02 DIAGNOSIS — Y998 Other external cause status: Secondary | ICD-10-CM | POA: Diagnosis not present

## 2016-01-02 HISTORY — DX: Unspecified psychosis not due to a substance or known physiological condition: F29

## 2016-01-02 HISTORY — DX: Unspecified atrial fibrillation: I48.91

## 2016-01-02 LAB — URINALYSIS COMPLETE WITH MICROSCOPIC (ARMC ONLY)
Bacteria, UA: NONE SEEN
Bilirubin Urine: NEGATIVE
Glucose, UA: NEGATIVE mg/dL
Leukocytes, UA: NEGATIVE
Nitrite: NEGATIVE
Protein, ur: 30 mg/dL — AB
Specific Gravity, Urine: 1.015 (ref 1.005–1.030)
pH: 7 (ref 5.0–8.0)

## 2016-01-02 LAB — COMPREHENSIVE METABOLIC PANEL
ALT: 9 U/L — ABNORMAL LOW (ref 14–54)
AST: 23 U/L (ref 15–41)
Albumin: 3.3 g/dL — ABNORMAL LOW (ref 3.5–5.0)
Alkaline Phosphatase: 71 U/L (ref 38–126)
Anion gap: 9 (ref 5–15)
BUN: 13 mg/dL (ref 6–20)
CO2: 27 mmol/L (ref 22–32)
Calcium: 8.5 mg/dL — ABNORMAL LOW (ref 8.9–10.3)
Chloride: 104 mmol/L (ref 101–111)
Creatinine, Ser: 0.57 mg/dL (ref 0.44–1.00)
GFR calc Af Amer: >60 mL/min (ref >60)
GFR calc non Af Amer: >60 mL/min (ref >60)
Glucose, Bld: 96 mg/dL (ref 65–99)
Potassium: 3.2 mmol/L — ABNORMAL LOW (ref 3.5–5.1)
Sodium: 140 mmol/L (ref 135–145)
Total Bilirubin: 0.9 mg/dL (ref 0.3–1.2)
Total Protein: 6.2 g/dL — ABNORMAL LOW (ref 6.5–8.1)

## 2016-01-02 LAB — URINE DRUG SCREEN, QUALITATIVE (ARMC ONLY)
Amphetamines, Ur Screen: NOT DETECTED
BARBITURATES, UR SCREEN: NOT DETECTED
Benzodiazepine, Ur Scrn: NOT DETECTED
CANNABINOID 50 NG, UR ~~LOC~~: NOT DETECTED
Cocaine Metabolite,Ur ~~LOC~~: NOT DETECTED
MDMA (ECSTASY) UR SCREEN: NOT DETECTED
Methadone Scn, Ur: NOT DETECTED
Opiate, Ur Screen: NOT DETECTED
PHENCYCLIDINE (PCP) UR S: NOT DETECTED
Tricyclic, Ur Screen: NOT DETECTED

## 2016-01-02 LAB — CBC WITH DIFFERENTIAL/PLATELET
BASOS ABS: 0.1 10*3/uL (ref 0–0.1)
Basophils Relative: 1 %
EOS ABS: 0.2 10*3/uL (ref 0–0.7)
EOS PCT: 2 %
HCT: 36 % (ref 35.0–47.0)
Hemoglobin: 11.8 g/dL — ABNORMAL LOW (ref 12.0–16.0)
LYMPHS ABS: 1.4 10*3/uL (ref 1.0–3.6)
LYMPHS PCT: 14 %
MCH: 27.9 pg (ref 26.0–34.0)
MCHC: 32.8 g/dL (ref 32.0–36.0)
MCV: 85.2 fL (ref 80.0–100.0)
MONO ABS: 0.9 10*3/uL (ref 0.2–0.9)
Monocytes Relative: 9 %
Neutro Abs: 7.6 10*3/uL — ABNORMAL HIGH (ref 1.4–6.5)
Neutrophils Relative %: 74 %
PLATELETS: 219 10*3/uL (ref 150–440)
RBC: 4.22 MIL/uL (ref 3.80–5.20)
RDW: 15.5 % — AB (ref 11.5–14.5)
WBC: 10.2 10*3/uL (ref 3.6–11.0)

## 2016-01-02 LAB — ETHANOL: Alcohol, Ethyl (B): <5 mg/dL (ref <5)

## 2016-01-02 LAB — SALICYLATE LEVEL: Salicylate Lvl: <4 mg/dL (ref 2.8–30.0)

## 2016-01-02 LAB — ACETAMINOPHEN LEVEL

## 2016-01-02 MED ORDER — ACETAMINOPHEN 325 MG PO TABS
650.0000 mg | ORAL_TABLET | Freq: Once | ORAL | Status: AC
  Administered 2016-01-02: 650 mg via ORAL
  Filled 2016-01-02: qty 2

## 2016-01-02 NOTE — ED Notes (Signed)
Beatrix Fetters, pt's Healthcare Power of [place]: [phone], Cell: [phone]

## 2016-01-02 NOTE — ED Notes (Signed)
Pt arrived via EMS from [facility] for a fall. Pt presents with swelling and bruising to left side of forehead. EMS reports 162/96, 88 HR, 95% RA.

## 2016-01-02 NOTE — ED Provider Notes (Signed)
[facility] Emergency Department Provider Note  ____________________________________________  Time seen: Approximately 7:58 AM  I have reviewed the triage vital signs and the nursing notes.   HISTORY  Chief Complaint Fall  Caveat-history of present illness review of systems is limited due to dementia. Information is obtained from EMS on their arrival as well as staff at [facility] and [employer], her Scientist, research (medical).  HPI Heather Barker is a 82 y.o. female with dementia, history of atrial fibrillation, not anticoagulated who presents for evaluation of fall at [facility]. According to staff, the patient has had increasing psychosis over the past few days, refusing medications, thinking that staff is "trying to kill her". She called the police twice yesterday and when they arrived to her room, she asked them to get her sweater out of the closet. She attempted to get up today without assistance and fell hitting her head and landing on her left side. She has had no fevers or chills, no vomiting or diarrhea, no complaints according to staff.   Past Medical History  Diagnosis Date  . Depression   . Hypertension   . Urinary incontinence   . Basal cell cancer 1990's    forehead  . Rosacea   . Stenosis, cervical spine   . Osteoarthritis   . Atrial fibrillation ([place])   . Psychosis     Patient Active Problem List   Diagnosis Date Noted  . OSTEOARTHRITIS 07/04/2008  . LEG PAIN 09/28/2007  . DEPRESSION 07/20/2007  . HYPERTENSION 07/20/2007  . ROSACEA 07/20/2007  . SPINAL STENOSIS, CERVICAL 07/20/2007  . URINARY INCONTINENCE 07/20/2007    Past Surgical History  Procedure Laterality Date  . Appendectomy    . Dilation and curettage of uterus  1970's  . Cataract extraction      left eye  . Knee arthroscopy  1996    right   . Rotator cuff repair      left torn  . Cataract extraction  07/2010    right, IOL    Current Outpatient Rx   Name  Route  Sig  Dispense  Refill  . acetaminophen (TYLENOL) 325 MG tablet   Oral   Take 650 mg by mouth every 4 (four) hours as needed.         Marland Kitchen amoxicillin (AMOXIL) 500 MG capsule   Oral   Take 500 mg by mouth 2 (two) times daily.         Marland Kitchen aspirin EC 81 MG tablet   Oral   Take 81 mg by mouth daily.         . digoxin (LANOXIN) 0.125 MG tablet   Oral   Take 0.125 mg by mouth daily.         Marland Kitchen diltiazem (TIAZAC) 240 MG 24 hr capsule   Oral   Take 240 mg by mouth daily.         Marland Kitchen guaiFENesin (ROBITUSSIN) 100 MG/5ML SOLN   Oral   Take 10 mLs by mouth every 4 (four) hours as needed for cough or to loosen phlegm.         . hydrOXYzine (VISTARIL) 25 MG capsule   Oral   Take 25 mg by mouth 3 (three) times daily as needed.         . lamoTRIgine (LAMICTAL) 150 MG tablet   Oral   Take 150 mg by mouth daily.         Marland Kitchen LORazepam (ATIVAN) 0.5 MG tablet  Oral   Take 0.5 mg by mouth every 8 (eight) hours as needed for anxiety.         . Menthol, Topical Analgesic, (BIOFREEZE) 4 % GEL   Apply externally   Apply 1 application topically 2 (two) times daily.         . metoprolol tartrate (LOPRESSOR) 25 MG tablet   Oral   Take 25 mg by mouth 2 (two) times daily.         . metroNIDAZOLE (METROGEL) 0.75 % gel   Topical   Apply 1 application topically 2 (two) times daily.         . Multiple Vitamin (MULTIVITAMIN) tablet   Oral   Take 1 tablet by mouth daily.         . promethazine (PHENERGAN) 25 MG tablet   Oral   Take 25 mg by mouth every 6 (six) hours as needed for nausea or vomiting.         Marland Kitchen QUEtiapine (SEROQUEL) 25 MG tablet   Oral   Take 25 mg by mouth 2 (two) times daily.           Allergies Codeine phosphate; Meperidine hcl; and Penicillins  Family History  Problem Relation Age of Onset  . Cancer Mother     lung  . Hypertension Mother   . Heart disease Father   . Hypertension Father   . Diabetes Neg Hx     Social  History Social History  Substance Use Topics  . Smoking status: Never Smoker   . Smokeless tobacco: Never Used  . Alcohol Use: Yes     Comment: occasional    Review of Systems Constitutional: No fever/chills Gastrointestinal:  No vomiting.  No diarrhea.    Caveat-history of present illness review of systems is limited due to dementia. Information is obtained from EMS on their arrival as well as staff at [facility] and [employer], her Scientist, research (medical). ____________________________________________   PHYSICAL EXAM:  VITAL SIGNS: ED Triage Vitals  Enc Vitals Group     BP 01/02/16 0743 132/102 mmHg     Pulse Rate 01/02/16 0743 87     Resp 01/02/16 0743 18     Temp 01/02/16 0743 98.2 F (36.8 C)     Temp src --      SpO2 01/02/16 0743 96 %     Weight 01/02/16 0743 115 lb 4 oz (52.277 kg)     Height 01/02/16 0743 5\' 2"  (1.575 m)     Head Cir --      Peak Flow --      Pain Score 01/02/16 0746 8     Pain Loc --      Pain Edu? --      Excl. in [place]? --     Constitutional: Alert and oriented to self and place but not to year. Well appearing and in no acute distress. Able to answer some simple questions appropriately but intermittently babbling incoherently about someone "telling me that I was dead" and about "dementia". Eyes: Conjunctivae are normal. PERRL. EOMI. Head: Large hematoma of the left forehead. Nose: No congestion/rhinnorhea. Mouth/Throat: Mucous membranes are moist.  Oropharynx non-erythematous. Neck: No stridor. No midline C-spine tenderness to palpation. Cardiovascular: Normal rate, regular rhythm. Grossly normal heart sounds.  Good peripheral circulation. Respiratory: Normal respiratory effort.  No retractions. Lungs CTAB. Gastrointestinal: Soft and nontender. No distention.  No CVA tenderness. Genitourinary: deferred Musculoskeletal: No lower extremity tenderness nor edema.  No joint effusions. Mild  tenderness to palpation of the left lower  ribs. Neurologic:  Normal speech and language. No gross focal neurologic deficits are appreciated.  Skin:  Skin is warm, dry and intact. No rash noted. Psychiatric: Mood and affect are normal. Speech and behavior are normal.  ____________________________________________   LABS (all labs ordered are listed, but only abnormal results are displayed)  Labs Reviewed  CBC WITH DIFFERENTIAL/PLATELET - Abnormal; Notable for the following:    Hemoglobin 11.8 (*)    RDW 15.5 (*)    Neutro Abs 7.6 (*)    All other components within normal limits  COMPREHENSIVE METABOLIC PANEL - Abnormal; Notable for the following:    Potassium 3.2 (*)    Calcium 8.5 (*)    Total Protein 6.2 (*)    Albumin 3.3 (*)    ALT 9 (*)    All other components within normal limits  URINALYSIS COMPLETEWITH MICROSCOPIC ([facility] ONLY) - Abnormal; Notable for the following:    Color, Urine YELLOW (*)    APPearance CLEAR (*)    Ketones, ur TRACE (*)    Hgb urine dipstick 2+ (*)    Protein, ur 30 (*)    Squamous Epithelial / LPF 0-5 (*)    All other components within normal limits  ACETAMINOPHEN LEVEL - Abnormal; Notable for the following:    Acetaminophen (Tylenol), Serum <10 (*)    All other components within normal limits  URINE DRUG SCREEN, QUALITATIVE ([facility] ONLY)  ETHANOL  SALICYLATE LEVEL   ____________________________________________  EKG  none ____________________________________________  RADIOLOGY  CT head and c-spine IMPRESSION: No acute intracranial abnormality.  Diffuse white matter disease suggests chronic small vessel ischemic changes.  Left forehead scalp hematoma without a calvarial fracture.  No acute bone abnormality in the cervical spine.  Multilevel degenerative changes in the cervical spine with areas of central and foraminal stenosis.  CXR IMPRESSION: 1. No acute cardiopulmonary process. 2. No acute fracture. 3. Aortic atherosclerosis. 4. Stable cardiomegaly and mild chronic  bronchitic change. ____________________________________________   PROCEDURES  Procedure(s) performed: None  Critical Care performed: No  ____________________________________________   INITIAL IMPRESSION / ASSESSMENT AND PLAN / ED COURSE  Pertinent labs & imaging results that were available during my care of the patient were reviewed by me and considered in my medical decision making (see chart for details).  Heather Barker is a 82 y.o. female with dementia, history of atrial fibrillation, not anticoagulated who presents for evaluation of fall at [facility]. On Exam, she is nontoxic appearing and in no acute distress. She is pleasantly demented, cooperative. Her exam is notable for hematoma of the left forehead as well as some tenderness in the left lower ribs. Her vital signs are stable, she is afebrile. She follows commands to move extremities equally and has a nonfocal neurological examination. Plan for CT scan of her head and C-spine as well as chest x-ray. We'll obtain screening labs, urinalysis as well. Staff at [facility] is requesting the patient be evaluated by psychiatry due to her worsening psychosis. We'll consult psychiatry.  ----------------------------------------- 3:43 PM on 01/02/2016 ----------------------------------------- Labs reviewed. CBC with mild anemia, hemoglobin 11.8. CMP notable for mild hypokalemia with potassium 3.2. Urinalysis with many red blood cells likely traumatic secondary to catheterization but not consistent with infection. Urine drug screen is negative. Negative acetaminophen, ethanol, salicylate levels. Chest x-ray shows no acute cardiopulmonary process. CT head and C-spine also negative. The patient is medically cleared. Psychiatry is evaluating. Care transferred to Dr. Jacqualine Code.  ____________________________________________   FINAL CLINICAL IMPRESSION(S) / ED DIAGNOSES  Final diagnoses:  Fall, initial encounter  Psychosis,  unspecified psychosis type  Dementia, with behavioral disturbance      Joanne Gavel, MD 01/02/16 1545

## 2016-01-02 NOTE — ED Provider Notes (Signed)
ECG reviewed and interpreted by me at 1732 Heart rate 65 QRS 80 QTc 400 Atrial fibrillation. No evidence of acute ST elevation MI. Nonspecific T-wave abnormality noted.  Discussed with Dr. Weber Cooks, he advises further evaluation and possible outpatient placement after his conversation with the patient.  Delman Kitten, MD 01/02/16 1755

## 2016-01-02 NOTE — Consult Note (Signed)
[facility] Face-to-Face Psychiatry Consult   Reason for Consult:  Consult for this 82 year old woman brought to the emergency room from [facility] with a report of a recent fall. Concern raised because of paranoid and odd behavior Referring Physician:  Edd Fabian Patient Identification: Heather Barker MRN:  [id number] Principal Diagnosis: Dementia with psychosis Diagnosis:   Patient Active Problem List   Diagnosis Date Noted  . Dementia with psychosis [F03.91] 01/02/2016  . Hypertension [I10] 01/02/2016  . OSTEOARTHRITIS [M19.90] 07/04/2008  . LEG PAIN [M79.609] 09/28/2007  . DEPRESSION [F32.9] 07/20/2007  . HYPERTENSION [I10] 07/20/2007  . ROSACEA [L71.9] 07/20/2007  . SPINAL STENOSIS, CERVICAL [M48.02] 07/20/2007  . URINARY INCONTINENCE [R32] 07/20/2007    Total Time spent with patient: 1 hour  Subjective:   Heather Barker is a 82 y.o. female patient admitted with "I've been better".  HPI:  Patient interviewed. Her healthcare power of attorney was present and was interviewed as well. Chart reviewed. This 82 year old woman was sent over from [facility] today. The history was a little bit hard for me to follow. Apparently she had had a fall and bumped her head and initially that was thought to be the only issue. Patient then seemed to become more confused and issues were raised about confused or paranoid behavior at [facility]. Patient was disorganized in her thinking. Tended to tell stories that jumped back and forth and time. She did indicate that she felt that people back at [facility] were saying that she was dead. She says that they had frightened her very badly in someway and she was unwilling to go back. Patient is not able to give any history about any change in medication. Her healthcare power of attorney professed to not know that the patient was receiving any psychiatric medicine although the coping paperwork shows that she is currently being prescribed Seroquel  Lamictal and Depakote. How long she's been on these are what the thinking wasn't starting them is unclear. Patient denies any suicidal ideation but does talk about how she would rather be dead than go back to [facility]. No homicidal ideation or threats of violence.  Social history: Patient evidently has adult children who tried to get guardianship of her but this was legally fought by the power of attorney successfully. It's not clear if the children or other family are really involved with her anymore. This power of attorney seems to visit her on a regular basis. Patient is currently living at [facility] but has been there for just a month or so. Prior to that was at [place] care which the power of attorney felt was substandard.  Medical history: History of hypertension arthritis leg pain and history of urinary incontinence. All of this is from the old chart and from collateral history from [place].  Substance abuse history: No evidence of substance abuse history or information about any past substance abuse problems  Past Psychiatric History: Patient reports that many years ago she got overwhelmed somehow and might of seen a mental health provider but says she is never had a psychiatric hospitalization. The healthcare power of attorney knows of no past psychiatric treatment. The patient is currently being prescribed Seroquel Depakote and Lamictal in exactly how that evolved is unclear. No known past psychiatric hospitalizations no known past suicide attempts or violence  Risk to Self: Suicidal Ideation: No Suicidal Intent: No Is patient at risk for suicide?: No Suicidal Plan?: No Access to Means: No What has been your use of  drugs/alcohol within the last 12 months?: None Reported How many times?: 0 Other Self Harm Risks: None Reported Triggers for Past Attempts: None known Intentional Self Injurious Behavior: None Risk to Others: Homicidal Ideation: No Thoughts of Harm to  Others: No Current Homicidal Intent: No Current Homicidal Plan: No Access to Homicidal Means: No Identified Victim: None Reported History of harm to others?: No Assessment of Violence: None Noted Violent Behavior Description: None Reported Does patient have access to weapons?: No Criminal Charges Pending?: No Does patient have a court date: No Prior Inpatient Therapy: Prior Inpatient Therapy: No Prior Therapy Dates: None Reported Prior Therapy Facilty/Provider(s): None Reported Reason for Treatment: None Reported Prior Outpatient Therapy: Prior Outpatient Therapy: No Prior Therapy Dates: None Reported Prior Therapy Facilty/Provider(s): None Reported Reason for Treatment: None Reported Does patient have an ACCT team?: No Does patient have Intensive In-House Services?  : No Does patient have [facility] services? : No Does patient have P4CC services?: No  Past Medical History:  Past Medical History  Diagnosis Date  . Depression   . Hypertension   . Urinary incontinence   . Basal cell cancer 1990's    forehead  . Rosacea   . Stenosis, cervical spine   . Osteoarthritis   . Atrial fibrillation ([place])   . Psychosis     Past Surgical History  Procedure Laterality Date  . Appendectomy    . Dilation and curettage of uterus  1970's  . Cataract extraction      left eye  . Knee arthroscopy  1996    right   . Rotator cuff repair      left torn  . Cataract extraction  07/2010    right, IOL   Family History:  Family History  Problem Relation Age of Onset  . Cancer Mother     lung  . Hypertension Mother   . Heart disease Father   . Hypertension Father   . Diabetes Neg Hx    Family Psychiatric  History: There is no information forthcoming or available about family history Social History:  History  Alcohol Use  . Yes    Comment: occasional     History  Drug Use Not on file    Social History   Social History  . Marital Status: Widowed    Spouse Name: N/A  . Number  of Children: 2  . Years of Education: N/A   Occupational History  . retired housewife, Psychologist, occupational, [employer]    Social History Main Topics  . Smoking status: Never Smoker   . Smokeless tobacco: Never Used  . Alcohol Use: Yes     Comment: occasional  . Drug Use: None  . Sexual Activity: Not Asked   Other Topics Concern  . None   Social History Narrative   Widowed twice   Now with a gentleman friend again since 6/09   2 adoptedchildren: One living, one deceased         Additional Social History:    Pain Medications: See PTA Prescriptions: See PTA Over the Counter: See PTA History of alcohol / drug use?: No history of alcohol / drug abuse Longest period of sobriety (when/how long): No History of abuse Negative Consequences of Use:  (No History of abuse) Withdrawal Symptoms:  (No History of abuse)                     Allergies:   Allergies  Allergen Reactions  . Codeine Phosphate  REACTION: intolerance--hydrocodone okay  . Meperidine Hcl     REACTION: unspecified  . Penicillins     Labs:  Results for orders placed or performed during the hospital encounter of 01/02/16 (from the past 48 hour(s))  CBC with Differential     Status: Abnormal   Collection Time: 01/02/16  8:59 AM  Result Value Ref Range   WBC 10.2 3.6 - 11.0 K/uL   RBC 4.22 3.80 - 5.20 MIL/uL   Hemoglobin 11.8 (L) 12.0 - 16.0 g/dL   HCT 36.0 35.0 - 47.0 %   MCV 85.2 80.0 - 100.0 fL   MCH 27.9 26.0 - 34.0 pg   MCHC 32.8 32.0 - 36.0 g/dL   RDW 15.5 (H) 11.5 - 14.5 %   Platelets 219 150 - 440 K/uL   Neutrophils Relative % 74 %   Neutro Abs 7.6 (H) 1.4 - 6.5 K/uL   Lymphocytes Relative 14 %   Lymphs Abs 1.4 1.0 - 3.6 K/uL   Monocytes Relative 9 %   Monocytes Absolute 0.9 0.2 - 0.9 K/uL   Eosinophils Relative 2 %   Eosinophils Absolute 0.2 0 - 0.7 K/uL   Basophils Relative 1 %   Basophils Absolute 0.1 0 - 0.1 K/uL  Comprehensive metabolic panel     Status: Abnormal   Collection  Time: 01/02/16  8:59 AM  Result Value Ref Range   Sodium 140 135 - 145 mmol/L   Potassium 3.2 (L) 3.5 - 5.1 mmol/L   Chloride 104 101 - 111 mmol/L   CO2 27 22 - 32 mmol/L   Glucose, Bld 96 65 - 99 mg/dL   BUN 13 6 - 20 mg/dL   Creatinine, Ser 0.57 0.44 - 1.00 mg/dL   Calcium 8.5 (L) 8.9 - 10.3 mg/dL   Total Protein 6.2 (L) 6.5 - 8.1 g/dL   Albumin 3.3 (L) 3.5 - 5.0 g/dL   AST 23 15 - 41 U/L   ALT 9 (L) 14 - 54 U/L   Alkaline Phosphatase 71 38 - 126 U/L   Total Bilirubin 0.9 0.3 - 1.2 mg/dL   GFR calc non Af Amer >60 >60 mL/min   GFR calc Af Amer >60 >60 mL/min    Comment: (NOTE) The eGFR has been calculated using the CKD EPI equation. This calculation has not been validated in all clinical situations. eGFR's persistently <60 mL/min signify possible Chronic Kidney Disease.    Anion gap 9 5 - 15  Acetaminophen level     Status: Abnormal   Collection Time: 01/02/16  8:59 AM  Result Value Ref Range   Acetaminophen (Tylenol), Serum <10 (L) 10 - 30 ug/mL    Comment:        THERAPEUTIC CONCENTRATIONS VARY SIGNIFICANTLY. A RANGE OF 10-30 ug/mL MAY BE AN EFFECTIVE CONCENTRATION FOR MANY PATIENTS. HOWEVER, SOME ARE BEST TREATED AT CONCENTRATIONS OUTSIDE THIS RANGE. ACETAMINOPHEN CONCENTRATIONS >150 ug/mL AT 4 HOURS AFTER INGESTION AND >50 ug/mL AT 12 HOURS AFTER INGESTION ARE OFTEN ASSOCIATED WITH TOXIC REACTIONS.   Ethanol     Status: None   Collection Time: 01/02/16  8:59 AM  Result Value Ref Range   Alcohol, Ethyl (B) <5 <5 mg/dL    Comment:        LOWEST DETECTABLE LIMIT FOR SERUM ALCOHOL IS 5 mg/dL FOR MEDICAL PURPOSES ONLY   Salicylate level     Status: None   Collection Time: 01/02/16  8:59 AM  Result Value Ref Range   Salicylate Lvl <1.0 2.8 - 30.0  mg/dL  Urinalysis complete, with microscopic ([facility] only)     Status: Abnormal   Collection Time: 01/02/16 12:21 PM  Result Value Ref Range   Color, Urine YELLOW (A) YELLOW   APPearance CLEAR (A) CLEAR   Glucose,  UA NEGATIVE NEGATIVE mg/dL   Bilirubin Urine NEGATIVE NEGATIVE   Ketones, ur TRACE (A) NEGATIVE mg/dL   Specific Gravity, Urine 1.015 1.005 - 1.030   Hgb urine dipstick 2+ (A) NEGATIVE   pH 7.0 5.0 - 8.0   Protein, ur 30 (A) NEGATIVE mg/dL   Nitrite NEGATIVE NEGATIVE   Leukocytes, UA NEGATIVE NEGATIVE   RBC / HPF TOO NUMEROUS TO COUNT 0 - 5 RBC/hpf   WBC, UA 0-5 0 - 5 WBC/hpf   Bacteria, UA NONE SEEN NONE SEEN   Squamous Epithelial / LPF 0-5 (A) NONE SEEN   Mucous PRESENT   Urine Drug Screen, Qualitative ([facility] only)     Status: None   Collection Time: 01/02/16 12:21 PM  Result Value Ref Range   Tricyclic, Ur Screen NONE DETECTED NONE DETECTED   Amphetamines, Ur Screen NONE DETECTED NONE DETECTED   MDMA (Ecstasy)Ur Screen NONE DETECTED NONE DETECTED   Cocaine Metabolite,Ur [place] NONE DETECTED NONE DETECTED   Opiate, Ur Screen NONE DETECTED NONE DETECTED   Phencyclidine (PCP) Ur S NONE DETECTED NONE DETECTED   Cannabinoid 50 Ng, Ur [place] NONE DETECTED NONE DETECTED   Barbiturates, Ur Screen NONE DETECTED NONE DETECTED   Benzodiazepine, Ur Scrn NONE DETECTED NONE DETECTED   Methadone Scn, Ur NONE DETECTED NONE DETECTED    Comment: (NOTE) 161  Tricyclics, urine               Cutoff 1000 ng/mL 200  Amphetamines, urine             Cutoff 1000 ng/mL 300  MDMA (Ecstasy), urine           Cutoff 500 ng/mL 400  Cocaine Metabolite, urine       Cutoff 300 ng/mL 500  Opiate, urine                   Cutoff 300 ng/mL 600  Phencyclidine (PCP), urine      Cutoff 25 ng/mL 700  Cannabinoid, urine              Cutoff 50 ng/mL 800  Barbiturates, urine             Cutoff 200 ng/mL 900  Benzodiazepine, urine           Cutoff 200 ng/mL 1000 Methadone, urine                Cutoff 300 ng/mL 1100 1200 The urine drug screen provides only a preliminary, unconfirmed 1300 analytical test result and should not be used for non-medical 1400 purposes. Clinical consideration and professional judgment should 1500 be  applied to any positive drug screen result due to possible 1600 interfering substances. A more specific alternate chemical method 1700 must be used in order to obtain a confirmed analytical result.  1800 Gas chromato graphy / mass spectrometry (GC/MS) is the preferred 1900 confirmatory method.     No current facility-administered medications for this encounter.   Current Outpatient Prescriptions  Medication Sig Dispense Refill  . acetaminophen (TYLENOL) 325 MG tablet Take 650 mg by mouth every 4 (four) hours as needed.    Marland Kitchen amoxicillin (AMOXIL) 500 MG capsule Take 500 mg by mouth 2 (two) times daily.    Marland Kitchen aspirin  EC 81 MG tablet Take 81 mg by mouth daily.    . digoxin (LANOXIN) 0.125 MG tablet Take 0.125 mg by mouth daily.    Marland Kitchen diltiazem (TIAZAC) 240 MG 24 hr capsule Take 240 mg by mouth daily.    Marland Kitchen guaiFENesin (ROBITUSSIN) 100 MG/5ML SOLN Take 10 mLs by mouth every 4 (four) hours as needed for cough or to loosen phlegm.    . hydrOXYzine (VISTARIL) 25 MG capsule Take 25 mg by mouth 3 (three) times daily as needed.    . lamoTRIgine (LAMICTAL) 150 MG tablet Take 150 mg by mouth daily.    Marland Kitchen LORazepam (ATIVAN) 0.5 MG tablet Take 0.5 mg by mouth every 8 (eight) hours as needed for anxiety.    . Menthol, Topical Analgesic, (BIOFREEZE) 4 % GEL Apply 1 application topically 2 (two) times daily.    . metoprolol tartrate (LOPRESSOR) 25 MG tablet Take 25 mg by mouth 2 (two) times daily.    . metroNIDAZOLE (METROGEL) 0.75 % gel Apply 1 application topically 2 (two) times daily.    . Multiple Vitamin (MULTIVITAMIN) tablet Take 1 tablet by mouth daily.    . promethazine (PHENERGAN) 25 MG tablet Take 25 mg by mouth every 6 (six) hours as needed for nausea or vomiting.    Marland Kitchen QUEtiapine (SEROQUEL) 25 MG tablet Take 25 mg by mouth 2 (two) times daily.      Musculoskeletal: Strength & Muscle Tone: decreased Gait & Station: unable to stand Patient leans: N/A  Psychiatric Specialty Exam: Review of  Systems  HENT: Negative.   Eyes: Negative.   Respiratory: Negative.   Cardiovascular: Negative.   Gastrointestinal: Negative.   Musculoskeletal: Positive for falls.  Skin: Negative.   Neurological: Positive for weakness.  Psychiatric/Behavioral: Positive for hallucinations and memory loss. Negative for depression, suicidal ideas and substance abuse. The patient is nervous/anxious and has insomnia.     Blood pressure 162/92, pulse 85, temperature 98.2 F (36.8 C), resp. rate 20, height _0  (1.575 m), weight 52.277 kg (115 lb 4 oz), SpO2 97 %.Body mass index is 21.07 kg/(m^2).  General Appearance: Disheveled  Eye Contact::  Minimal  Speech:  Garbled  Volume:  Decreased  Mood:  Anxious  Affect:  Anxious and nervous  Thought Process:  Disorganized  Orientation:  Full (Time, Place, and Person)  Thought Content:  Paranoid Ideation  Suicidal Thoughts:  No  Homicidal Thoughts:  No  Memory:  Immediate;   Fair Recent;   Fair Remote;   Poor  Judgement:  Impaired  Insight:  Lacking  Psychomotor Activity:  Decreased  Concentration:  Poor  Recall:  [employer] of Knowledge:Fair  Language: Fair  Akathisia:  No  Handed:  Right  AIMS (if indicated):     Assets:  Desire for Improvement Financial Resources/Insurance Housing Social Support  ADL's:  Impaired  Cognition: Impaired,  Mild  Sleep:      Treatment Plan Summary: Daily contact with patient to assess and evaluate symptoms and progress in treatment, Medication management and Plan Not a lot of information to go on. This appears to be an 82 year old woman who presents with some paranoia and confused thinking and behavior which presumably has been getting worse recently or at least for the last 2 or 3 weeks. The medical workup is not forthcoming there is no sign of any acute infection or clear medical problem. Unclear how long she's been on her psychiatric medicines and whether they have been helpful or not. We don't know of  any past  psychiatric history. I had suggested the possibility of her returning to [facility] with the patient becomes quite agitated and says that she will not go back there and it terrifies her. Under the current circumstances I think we will keep her in the emergency room and refer her for inpatient geriatric psychiatry treatment. Continue current medications as prescribed. I will file commitment paperwork if necessary. Case reviewed with emergency room physician and TTS.  Disposition: Recommend psychiatric Inpatient admission when medically cleared.  John Clapacs 01/02/2016 5:29 PM

## 2016-01-02 NOTE — BH Assessment (Addendum)
Assessment Note  Heather Barker is an 82 y.o. female who Initially presents to the ER due to having a fall and hitting her head.  Per the friend, she noticed a change in her behaviors, approximately a month ago.  She went out of town and when she came back the patient was confused, incoherent and "not herself."  Friend believed she a stroke. However, it was discovered, she was dehydrated. She was giving IV fluids, approximately 2 to 3 bags. Bags was administered while was at the nursing home. She was seen by the nursing home physician assistant.    Friend also reports, she became power of attorney this past August. At one point, someone else was in charge of her finances but they weren't paying her bills. Thus, she had to moved out a facility she grown to love. It was traumatic for her and she make references to it. At one point, another facility in [facility] was the only place that would take her. She was afraid she was going to be "taking away from her love ones (natural support system). She was talking more about it when she was at the height of her confusion.   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 Friend start laughing and explained. When she was living at her former nursing home, the staff stated "she put a hex on them" due to reporting the bad things they were doing. Some of them lost their jobs. Thus, she was nicknamed "the Insurance underwriter."  During the interview, the patient was calm, pleasant and cooperative. She was having paranoia and confusion.  She states, people at the nursing home are trying to kill her. She also shared, how they are doing experiments on other residents to pay the bills.  At times, writer was unable  to follow her stream of the thoughts and had to relay on the friend to explain what she was trying say. Speech was clear and words understandable but she was trying to communicate was difficult to follow.  Patient denies SI/HI   Power of Attorney---Heather [phone](cell), [phone] (home)  Diagnosis: Depression  Past Medical History:  Past Medical History  Diagnosis Date  . Depression   . Hypertension   . Urinary incontinence   . Basal cell cancer 1990's    forehead  . Rosacea   . Stenosis, cervical spine   . Osteoarthritis   . Atrial fibrillation ([place])   . Psychosis     Past Surgical History  Procedure Laterality Date  . Appendectomy    . Dilation and curettage of uterus  1970's  . Cataract extraction      left eye  . Knee arthroscopy  1996    right   . Rotator cuff repair      left torn  . Cataract extraction  07/2010    right, IOL    Family History:  Family History  Problem Relation Age of Onset  . Cancer Mother     lung  . Hypertension Mother   . Heart disease Father   . Hypertension Father   . Diabetes Neg Hx     Social History:  reports that she has never smoked. She has never used smokeless  tobacco. She reports that she drinks alcohol. Her drug history is not on file.  Additional Social History:  Alcohol / Drug Use Pain Medications: See PTA Prescriptions: See PTA Over the Counter: See PTA History of alcohol / drug use?: No history of alcohol / drug abuse Longest period of sobriety (when/how long): No History of abuse Negative Consequences of Use:  (No History of abuse) Withdrawal Symptoms:  (No History of abuse)  CIWA: CIWA-Ar BP: 139/90 mmHg Pulse Rate: 89 COWS:    Allergies:  Allergies  Allergen Reactions  . Codeine Phosphate     REACTION: intolerance--hydrocodone okay  . Meperidine Hcl     REACTION: unspecified  . Penicillins     Home Medications:  (Not in a hospital admission)  OB/GYN Status:  No LMP recorded.  Patient is postmenopausal.  General Assessment Data Location of Assessment: [facility] ED TTS Assessment: In system Is this a Tele or Face-to-Face Assessment?: Face-to-Face Is this an Initial Assessment or a Re-assessment for this encounter?: Initial Assessment Marital status: Widowed Lillie name: n/a Is patient pregnant?: No Pregnancy Status: No Living Arrangements: Other (Comment) (Nursing Home) Can pt return to current living arrangement?: Yes Admission Status: Voluntary Is patient capable of signing voluntary admission?: Yes Referral Source: Self/Family/Friend Insurance type: Medicare  Medical Screening Exam ([place]) Medical Exam completed: Yes  Crisis Care Plan Living Arrangements: Other (Comment) (Nursing Home) Legal Guardian: Other: (None) Name of Psychiatrist: None Name of Therapist: None  Education Status Is patient currently in school?: No Current Grade: n/a Highest grade of school patient has completed: [employer] Name of school: n/a Contact person: n/a  Risk to self with the past 6 months Suicidal Ideation: No Has patient been a risk to self within the past 6 months prior to admission? : No Suicidal Intent: No Has patient had any suicidal intent within the past 6 months prior to admission? : No Is patient at risk for suicide?: No Suicidal Plan?: No Has patient had any suicidal plan within the past 6 months prior to admission? : No Access to Means: No What has been your use of drugs/alcohol within the last 12 months?: None Reported Previous Attempts/Gestures: No How many times?: 0 Other Self Harm Risks: None Reported Triggers for Past Attempts: None known Intentional Self Injurious Behavior: None Family Suicide History: No Recent stressful life event(s): Recent negative physical changes, Loss (Comment) Persecutory voices/beliefs?: No Depression: Yes Depression Symptoms: Feeling worthless/self pity, Feeling angry/irritable Substance abuse history  and/or treatment for substance abuse?: No Suicide prevention information given to non-admitted patients: Not applicable  Risk to Others within the past 6 months Homicidal Ideation: No Does patient have any lifetime risk of violence toward others beyond the six months prior to admission? : No Thoughts of Harm to Others: No Current Homicidal Intent: No Current Homicidal Plan: No Access to Homicidal Means: No Identified Victim: None Reported History of harm to others?: No Assessment of Violence: None Noted Violent Behavior Description: None Reported Does patient have access to weapons?: No Criminal Charges Pending?: No Does patient have a court date: No Is patient on probation?: No  Psychosis Hallucinations: None noted Delusions: Persecutory  Mental Status Report Appearance/Hygiene: In scrubs, Unremarkable, In hospital gown Eye Contact: Fair Motor Activity: Unable to assess (Patient is lying in the bed) Speech: Logical/coherent, Soft Level of Consciousness: Alert Mood: Anxious, Pleasant Affect: Appropriate to circumstance, Anxious Anxiety Level: Minimal Thought Processes: Coherent, Relevant Judgement: Partial Orientation: Person, Place, Time, Situation, Appropriate for developmental age Obsessive Compulsive  Thoughts/Behaviors: Minimal  Cognitive Functioning Concentration: Decreased Memory: Remote Intact, Recent Impaired IQ: Average Insight: Poor Impulse Control: Fair Appetite: Poor Weight Loss: 15 Weight Gain: 0 Sleep: Decreased Total Hours of Sleep: 6 Vegetative Symptoms: None  ADLScreening [facility] Assessment Services) Patient's cognitive ability adequate to safely complete daily activities?: Yes Patient able to express need for assistance with ADLs?: Yes Independently performs ADLs?: No  Prior Inpatient Therapy Prior Inpatient Therapy: No Prior Therapy Dates: None Reported Prior Therapy Facilty/Provider(s): None Reported Reason for Treatment: None  Reported  Prior Outpatient Therapy Prior Outpatient Therapy: No Prior Therapy Dates: None Reported Prior Therapy Facilty/Provider(s): None Reported Reason for Treatment: None Reported Does patient have an ACCT team?: No Does patient have Intensive In-House Services?  : No Does patient have [facility] services? : No Does patient have P4CC services?: No  ADL Screening (condition at time of admission) Patient's cognitive ability adequate to safely complete daily activities?: Yes Is the patient deaf or have difficulty hearing?: No Does the patient have difficulty seeing, even when wearing glasses/contacts?: Yes (Wear Glasses) Does the patient have difficulty concentrating, remembering, or making decisions?: No Patient able to express need for assistance with ADLs?: Yes Does the patient have difficulty dressing or bathing?: Yes (Staff assist her) Independently performs ADLs?: No Does the patient have difficulty walking or climbing stairs?: Yes Weakness of Legs: Both Weakness of Arms/Hands: None  Home Assistive Devices/Equipment Home Assistive Devices/Equipment: None  Therapy Consults (therapy consults require a physician order) PT Evaluation Needed: No OT Evalulation Needed: No SLP Evaluation Needed: No Abuse/Neglect Assessment (Assessment to be complete while patient is alone) Physical Abuse: Denies Verbal Abuse: Denies Sexual Abuse: Denies Exploitation of patient/patient's resources: Denies Self-Neglect: Denies Values / Beliefs Cultural Requests During Hospitalization: None Spiritual Requests During Hospitalization: None Consults Spiritual Care Consult Needed: No Advance Directives (For Healthcare) Does patient have an advance directive?: Yes Type of Advance Directive: Out of facility DNR (pink MOST or yellow form)    Additional Information 1:1 In Past 12 Months?: No CIRT Risk: No Elopement Risk: No Does patient have medical clearance?: Yes  Child/Adolescent  Assessment Running Away Risk: Denies (Patient is an adult)  Disposition:  Disposition Initial Assessment Completed for this Encounter: Yes Disposition of Patient: Other dispositions (Psych Consult) Other disposition(s): Other (Comment) (Psych Consult)  On Site Evaluation by:   Reviewed with Physician:     Gunnar Fusi, MS, LCAS, LPC, [place], CCSI 01/02/2016 12:15 PM

## 2016-01-03 DIAGNOSIS — S0083XA Contusion of other part of head, initial encounter: Secondary | ICD-10-CM | POA: Diagnosis not present

## 2016-01-03 LAB — URINALYSIS COMPLETE WITH MICROSCOPIC (ARMC ONLY)
BILIRUBIN URINE: NEGATIVE
Bacteria, UA: NONE SEEN
Glucose, UA: NEGATIVE mg/dL
KETONES UR: NEGATIVE mg/dL
NITRITE: POSITIVE — AB
PH: 5 (ref 5.0–8.0)
PROTEIN: NEGATIVE mg/dL
SPECIFIC GRAVITY, URINE: 1.021 (ref 1.005–1.030)

## 2016-01-03 NOTE — ED Provider Notes (Signed)
-----------------------------------------   6:59 AM on 01/03/2016 -----------------------------------------   Blood pressure 162/92, pulse 85, temperature 98.2 F (36.8 C), resp. rate 20, height 5\' 2"  (1.575 m), weight 115 lb 4 oz (52.277 kg), SpO2 97 %.  The patient had no acute events since last update.  Calm and cooperative at this time. Awaiting geriatric psych placement.     Loney Hering, MD 01/03/16 [phone]

## 2016-01-03 NOTE — ED Notes (Signed)
pts POA evelyn called to inquire on pt status. Pt is oriented. Gave phone to pt for her to update POA

## 2016-01-03 NOTE — BH Assessment (Addendum)
Writer followed up with Referrals:    [facility]([phone])-No Beds, pending review   [facility]([phone] ex.[phone]), Unable to reach anyone. Left a voice message requesting a return phone call.   [id number]), No Beds   [facility]([phone] ), No Beds   [phone]), Unable reach anyone in admission.   [facility] [phone]), Patient was concerned about red blood cells in her urine. Want to know if their is anything medically going on. Informed ER MD (Dr. Thomasene Lot) and her RN Deneise Lever).Marland KitchenMarland Kitchen Updated labs and CT Scan's were faxed to [facility] and reviewed. Possible placement with them.... Patient is being considered with [facility]. Patient will need to be under IVC or have her POA sign consent for treatment and she will go voluntary. Spoke with ER MD (Dr. Archie Balboa) and voiced concerns about patient transporting via [place] due to psychosis and paranoia. Writer requested she be placed under IVC. ER MD stated, he will evaluate her and see. Psych MD wasn't present at the time.

## 2016-01-03 NOTE — BHH Counselor (Signed)
Referral information for Geriatric Placement have been faxed to;    [facility]([phone]),    [phone]    [phone]),    [facility]([phone]),    [facility]([phone]),    [facility] [phone])

## 2016-01-03 NOTE — ED Notes (Signed)
NAD. Respirations unlabored. Sitter outside room.

## 2016-01-03 NOTE — ED Notes (Signed)
Pt incontinent. Changed.

## 2016-01-03 NOTE — ED Notes (Signed)
Given breakfast try.

## 2016-01-04 DIAGNOSIS — F0391 Unspecified dementia with behavioral disturbance: Secondary | ICD-10-CM | POA: Diagnosis not present

## 2016-01-04 DIAGNOSIS — S0083XA Contusion of other part of head, initial encounter: Secondary | ICD-10-CM | POA: Diagnosis not present

## 2016-01-04 MED ORDER — DIGOXIN 125 MCG PO TABS
0.1250 mg | ORAL_TABLET | Freq: Every day | ORAL | Status: DC
  Administered 2016-01-04 – 2016-01-07 (×4): 0.125 mg via ORAL
  Filled 2016-01-04 (×4): qty 1

## 2016-01-04 MED ORDER — METOPROLOL TARTRATE 25 MG PO TABS
25.0000 mg | ORAL_TABLET | Freq: Two times a day (BID) | ORAL | Status: DC
  Administered 2016-01-04 – 2016-01-07 (×7): 25 mg via ORAL
  Filled 2016-01-04 (×7): qty 1

## 2016-01-04 MED ORDER — ADULT MULTIVITAMIN W/MINERALS CH
1.0000 | ORAL_TABLET | Freq: Every day | ORAL | Status: DC
  Administered 2016-01-04 – 2016-01-07 (×5): 1 via ORAL
  Filled 2016-01-04 (×3): qty 1

## 2016-01-04 MED ORDER — QUETIAPINE FUMARATE 25 MG PO TABS
25.0000 mg | ORAL_TABLET | Freq: Two times a day (BID) | ORAL | Status: DC
  Administered 2016-01-04 – 2016-01-06 (×5): 25 mg via ORAL
  Filled 2016-01-04 (×5): qty 1

## 2016-01-04 MED ORDER — ACETAMINOPHEN 325 MG PO TABS
650.0000 mg | ORAL_TABLET | ORAL | Status: DC | PRN
  Administered 2016-01-06: 650 mg via ORAL
  Filled 2016-01-04: qty 2

## 2016-01-04 MED ORDER — LAMOTRIGINE 25 MG PO TABS
ORAL_TABLET | ORAL | Status: AC
  Administered 2016-01-04: 150 mg via ORAL
  Filled 2016-01-04: qty 1

## 2016-01-04 MED ORDER — ASPIRIN EC 81 MG PO TBEC
81.0000 mg | DELAYED_RELEASE_TABLET | Freq: Every day | ORAL | Status: DC
  Administered 2016-01-04 – 2016-01-07 (×4): 81 mg via ORAL
  Filled 2016-01-04 (×4): qty 1

## 2016-01-04 MED ORDER — DILTIAZEM HCL ER BEADS 240 MG PO CP24
240.0000 mg | ORAL_CAPSULE | Freq: Every day | ORAL | Status: DC
  Administered 2016-01-04 – 2016-01-07 (×4): 240 mg via ORAL
  Filled 2016-01-04 (×6): qty 1

## 2016-01-04 MED ORDER — ADULT MULTIVITAMIN W/MINERALS CH
ORAL_TABLET | ORAL | Status: AC
  Administered 2016-01-04: 1 via ORAL
  Filled 2016-01-04: qty 1

## 2016-01-04 MED ORDER — LAMOTRIGINE 100 MG PO TABS
150.0000 mg | ORAL_TABLET | Freq: Every day | ORAL | Status: DC
  Administered 2016-01-04 – 2016-01-07 (×3): 150 mg via ORAL
  Filled 2016-01-04: qty 6
  Filled 2016-01-04: qty 1.5
  Filled 2016-01-04: qty 6

## 2016-01-04 MED ORDER — CEPHALEXIN 500 MG PO CAPS
500.0000 mg | ORAL_CAPSULE | Freq: Two times a day (BID) | ORAL | Status: DC
Start: 2016-01-04 — End: 2016-01-07
  Administered 2016-01-04 – 2016-01-07 (×7): 500 mg via ORAL
  Filled 2016-01-04 (×7): qty 1

## 2016-01-04 NOTE — BH Assessment (Signed)
----  Writer spoke to Psych MD (Dr. Tami Ribas) concerning the patient's disposition with [facility]. Updated him about the concerns (psychosis and paranoia) of patient remaining voluntary and transporting to [facility] via [place]. Based on what was shared by Boykin Nearing, they couldn't make an official  bed offer until, consent for treatment was signed by patient Power of Attorney or have copy of IVC. Psych MD stated he will see the patient.  -------Psych MD initiated the process to put patient under IVC. At this time, waiting for custody order to be completed, in order to faxed to [facility].   --------Writer made several attempts throughout the day to contact [facility] but was unsuccessful, until after 18:00. Spoke with Helene Kelp and she stated their wasn't anyone in there admission department, that's why writer was unable to contact anyone. She stated, she didn't have access to the patient's information but send IVC and it will be reviewed.

## 2016-01-04 NOTE — ED Notes (Signed)

## 2016-01-04 NOTE — ED Notes (Signed)
Supper provided along with an extra drink   She had a visitor earlier and I have allowed her to have some sausage balls that her visitor brought to her - I warmed them and then gave them to her   Pt appreciative  Pt observed with no unusual behavior  Appropriate to stimulation  No verbalized needs or concerns at this time  NAD assessed  Continue to monitor

## 2016-01-04 NOTE — ED Provider Notes (Signed)
-----------------------------------------   5:22 AM on 01/04/2016 -----------------------------------------   Blood pressure 148/95, pulse 85, temperature 97.7 F (36.5 C), temperature source Oral, resp. rate 16, height 5\' 2"  (1.575 m), weight 52.277 kg, SpO2 96 %.  The patient had no acute events since last update.  Calm and cooperative at this time.  Pending geropsych placement.  I reviewed Dr. Weber Cooks note who mentioned that she should continue on her regular medications, but I see that no medications of been ordered.  A pharmacy technician did reconcile her [facility] when she arrived to the emergency department.  I reordered the daily medications that she needs for blood pressure control, etc.  I also reordered her Lamictal and Seroquel that she was on previously.  He did not continue her on amoxicillin since I do not know what was the indication.  Additionally, she has a nitrite positive urinalysis yesterday with trace leukocytes, so I started her on Keflex for UTI.   Hinda Kehr, MD 01/04/16 0530

## 2016-01-04 NOTE — ED Notes (Signed)
BEHAVIORAL HEALTH ROUNDING Patient sleeping: No. Patient alert and oriented: yes Behavior appropriate: Yes.  ; If no, describe:  Nutrition and fluids offered: yes Toileting and hygiene offered: Yes  Sitter present: q15 minute observations and security  monitoring Law enforcement present: Yes  ODS  

## 2016-01-04 NOTE — ED Notes (Signed)
ED BHU [place] Is the patient under IVC or is there intent for IVC: No. Is the patient medically cleared: Yes.   Is there vacancy in the ED BHU: No. Is the population mix appropriate for patient: No. Is the patient awaiting placement in inpatient or outpatient setting: Yes.   Has the patient had a psychiatric consult: Yes.   Survey of unit performed for contraband, proper placement and condition of furniture, tampering with fixtures in bathroom, shower, and each patient room: Yes.  ; Findings:  APPEARANCE/BEHAVIOR Pt asleep NEURO ASSESSMENT Orientation: Pt asleep Hallucinations: Pt asleep Speech: Pt asleep Gait: Pt asleep RESPIRATORY ASSESSMENT Normal expansion.  Clear to auscultation.  No rales, rhonchi, or wheezing. CARDIOVASCULAR ASSESSMENT regular rate and rhythm, S1, S2 normal, no murmur, click, rub or gallop GASTROINTESTINAL ASSESSMENT soft, nontender, BS WNL, no r/g EXTREMITIES Pt asleep PLAN OF CARE Provide calm/safe environment. Vital signs assessed twice daily. ED BHU Assessment once each 12-hour shift. Collaborate with intake RN daily or as condition indicates. Assure the ED provider has rounded once each shift. Provide and encourage hygiene. Provide redirection as needed. Assess for escalating behavior; address immediately and inform ED provider.  Assess family dynamic and appropriateness for visitation as needed: No.; If necessary, describe findings: family unavailable Educate the patient/family about BHU procedures/visitation: No.; If necessary, describe findings: family unavailable

## 2016-01-04 NOTE — ED Notes (Signed)
Pt observed lying in bed   Pt visualized with NAD  No verbalized needs or concerns at this time  Continue to monitor 

## 2016-01-04 NOTE — ED Notes (Signed)
Breakfast provided along with an extra drink - water OJ   Pt observed with no unusual behavior  Appropriate to stimulation  No verbalized needs or concerns at this time  NAD assessed  Continue to monitor

## 2016-01-04 NOTE — ED Notes (Signed)
ED BHU [place] Is the patient under IVC or is there intent for IVC: no                                                           Is the patient medically cleared: Yes.   Is there vacancy in the ED BHU: n/a Is the population mix appropriate for patient: Yes.  incontinence Is the patient awaiting placement in inpatient or outpatient setting: Yes.   Has the patient had a psychiatric consult: Yes.   Survey of unit performed for contraband, proper placement and condition of furniture, tampering with fixtures in bathroom, shower, and each patient room: Yes.  ; Findings:  APPEARANCE/BEHAVIOR Calm and cooperative NEURO ASSESSMENT Orientation: oriented x3  Denies pain Hallucinations: No.None noted (Hallucinations) Speech: Normal Gait: normal/ unsteady gait  Requires one person assist  She normally walks with a walker RESPIRATORY ASSESSMENT Even  Unlabored respirations  CARDIOVASCULAR ASSESSMENT Pulses equal   regular rate  Skin warm and dry   GASTROINTESTINAL ASSESSMENT no GI complaint EXTREMITIES Full ROM  PLAN OF CARE Provide calm/safe environment. Vital signs assessed twice daily. ED BHU Assessment once each 12-hour shift. Collaborate with intake RN daily or as condition indicates. Assure the ED provider has rounded once each shift. Provide and encourage hygiene. Provide redirection as needed. Assess for escalating behavior; address immediately and inform ED provider.  Assess family dynamic and appropriateness for visitation as needed: Yes.  ; If necessary, describe findings:  Educate the patient/family about BHU procedures/visitation: Yes.  ; If necessary, describe findings:

## 2016-01-04 NOTE — Consult Note (Signed)
[facility] Face-to-Face Psychiatry Consult   Reason for Consult:  Consult for this 82 year old woman brought to the emergency room from [facility] with a report of a recent fall. Concern raised because of paranoid and odd behavior  Referring Physician:  Edd Fabian Patient Identification: Heather Barker MRN:  [id number] Principal Diagnosis: Dementia with psychosis Diagnosis:   Patient Active Problem List   Diagnosis Date Noted  . Dementia with psychosis [F03.91] 01/02/2016  . Hypertension [I10] 01/02/2016  . OSTEOARTHRITIS [M19.90] 07/04/2008  . LEG PAIN [M79.609] 09/28/2007  . DEPRESSION [F32.9] 07/20/2007  . HYPERTENSION [I10] 07/20/2007  . ROSACEA [L71.9] 07/20/2007  . SPINAL STENOSIS, CERVICAL [M48.02] 07/20/2007  . URINARY INCONTINENCE [R32] 07/20/2007    Total Time spent with patient: 1 hour  Subjective:   Heather Barker is a 82 y.o. female patient admitted with unusual behaviors. Pt shares she is currently doing well overall. She continues to feel she died and that the nursing home felt she died. Pt shared her sleep was interrupted. Appetite is improving. She is medication compliant and without medication side effects. She is tangential and circumstantial. She denies SI, HI and AVH at this time.   Social history: Patient evidently has adult children who tried to get guardianship of her but this was legally fought by the power of attorney successfully. It's not clear if the children or other family are really involved with her anymore. This power of attorney seems to visit her on a regular basis. Patient is currently living at [facility] but has been there for just a month or so. Prior to that was at [facility] which the power of attorney felt was substandard.  Medical history: History of hypertension arthritis leg pain and history of urinary incontinence. All of this is from the old chart and from collateral history from [place].  Substance abuse history: Denies   Past  Psychiatric History: Patient reports that many years ago she got overwhelmed somehow and might of seen a mental health provider but says she is never had a psychiatric hospitalization. The healthcare power of attorney knows of no past psychiatric treatment. The patient is currently being prescribed Seroquel Depakote and Lamictal in exactly how that evolved is unclear. No known past psychiatric hospitalizations no known past suicide attempts or violence  Risk to Self: Suicidal Ideation: No Suicidal Intent: No Is patient at risk for suicide?: No Suicidal Plan?: No Access to Means: No What has been your use of drugs/alcohol within the last 12 months?: None Reported How many times?: 0 Other Self Harm Risks: None Reported Triggers for Past Attempts: None known Intentional Self Injurious Behavior: None Risk to Others: Homicidal Ideation: No Thoughts of Harm to Others: No Current Homicidal Intent: No Current Homicidal Plan: No Access to Homicidal Means: No Identified Victim: None Reported History of harm to others?: No Assessment of Violence: None Noted Violent Behavior Description: None Reported Does patient have access to weapons?: No Criminal Charges Pending?: No Does patient have a court date: No Prior Inpatient Therapy: Prior Inpatient Therapy: No Prior Therapy Dates: None Reported Prior Therapy Facilty/Provider(s): None Reported Reason for Treatment: None Reported Prior Outpatient Therapy: Prior Outpatient Therapy: No Prior Therapy Dates: None Reported Prior Therapy Facilty/Provider(s): None Reported Reason for Treatment: None Reported Does patient have an ACCT team?: No Does patient have Intensive In-House Services?  : No Does patient have [facility] services? : No Does patient have P4CC services?: No  Past Medical History:  Past Medical History  Diagnosis Date  .  Depression   . Hypertension   . Urinary incontinence   . Basal cell cancer 1990's    forehead  . Rosacea   .  Stenosis, cervical spine   . Osteoarthritis   . Atrial fibrillation ([place])   . Psychosis     Past Surgical History  Procedure Laterality Date  . Appendectomy    . Dilation and curettage of uterus  1970's  . Cataract extraction      left eye  . Knee arthroscopy  1996    right   . Rotator cuff repair      left torn  . Cataract extraction  07/2010    right, IOL   Family History:  Family History  Problem Relation Age of Onset  . Cancer Mother     lung  . Hypertension Mother   . Heart disease Father   . Hypertension Father   . Diabetes Neg Hx    Family Psychiatric  History: There is no information forthcoming or available about family history  Social History:  History  Alcohol Use  . Yes    Comment: occasional     History  Drug Use Not on file    Social History   Social History  . Marital Status: Widowed    Spouse Name: N/A  . Number of Children: 2  . Years of Education: N/A   Occupational History  . retired housewife, Psychologist, occupational, travel agencies    Social History Main Topics  . Smoking status: Never Smoker   . Smokeless tobacco: Never Used  . Alcohol Use: Yes     Comment: occasional  . Drug Use: None  . Sexual Activity: Not Asked   Other Topics Concern  . None   Social History Narrative   Widowed twice   Now with a gentleman friend again since 6/09   2 adoptedchildren: One living, one deceased         Additional Social History:    Pain Medications: See PTA Prescriptions: See PTA Over the Counter: See PTA History of alcohol / drug use?: No history of alcohol / drug abuse Longest period of sobriety (when/how long): No History of abuse Negative Consequences of Use:  (No History of abuse) Withdrawal Symptoms:  (No History of abuse)   Allergies:   Allergies  Allergen Reactions  . Codeine Phosphate     REACTION: intolerance--hydrocodone okay  . Meperidine Hcl     REACTION: unspecified  . Penicillins     Labs:  Results for orders placed or  performed during the hospital encounter of 01/02/16 (from the past 48 hour(s))  Urinalysis complete, with microscopic ([facility] only)     Status: Abnormal   Collection Time: 01/03/16  2:55 PM  Result Value Ref Range   Color, Urine AMBER (A) YELLOW   APPearance CLEAR (A) CLEAR   Glucose, UA NEGATIVE NEGATIVE mg/dL   Bilirubin Urine NEGATIVE NEGATIVE   Ketones, ur NEGATIVE NEGATIVE mg/dL   Specific Gravity, Urine 1.021 1.005 - 1.030   Hgb urine dipstick 2+ (A) NEGATIVE   pH 5.0 5.0 - 8.0   Protein, ur NEGATIVE NEGATIVE mg/dL   Nitrite POSITIVE (A) NEGATIVE   Leukocytes, UA TRACE (A) NEGATIVE   RBC / HPF 6-30 0 - 5 RBC/hpf   WBC, UA 6-30 0 - 5 WBC/hpf   Bacteria, UA NONE SEEN NONE SEEN   Squamous Epithelial / LPF 0-5 (A) NONE SEEN   Mucous PRESENT     Current Facility-Administered Medications  Medication Dose Route Frequency  Provider Last Rate Last Dose  . acetaminophen (TYLENOL) tablet 650 mg  650 mg Oral Q4H PRN Hinda Kehr, MD      . aspirin EC tablet 81 mg  81 mg Oral Daily Hinda Kehr, MD   81 mg at 01/04/16 1026  . cephALEXin (KEFLEX) capsule 500 mg  500 mg Oral Q12H Hinda Kehr, MD   500 mg at 01/04/16 1027  . digoxin (LANOXIN) tablet 0.125 mg  0.125 mg Oral Daily Hinda Kehr, MD   0.125 mg at 01/04/16 1044  . diltiazem (TIAZAC) 24 hr capsule 240 mg  240 mg Oral Daily Hinda Kehr, MD   240 mg at 01/04/16 1044  . lamoTRIgine (LAMICTAL) tablet 150 mg  150 mg Oral Daily Hinda Kehr, MD   150 mg at 01/04/16 1026  . metoprolol tartrate (LOPRESSOR) tablet 25 mg  25 mg Oral BID Hinda Kehr, MD   25 mg at 01/04/16 1028  . multivitamin with minerals tablet 1 tablet  1 tablet Oral Daily Hinda Kehr, MD   1 tablet at 01/04/16 1040  . QUEtiapine (SEROQUEL) tablet 25 mg  25 mg Oral BID Hinda Kehr, MD   25 mg at 01/04/16 1028   Current Outpatient Prescriptions  Medication Sig Dispense Refill  . acetaminophen (TYLENOL) 325 MG tablet Take 650 mg by mouth every 4 (four) hours as needed.     Marland Kitchen amoxicillin (AMOXIL) 500 MG capsule Take 500 mg by mouth 2 (two) times daily.    Marland Kitchen aspirin EC 81 MG tablet Take 81 mg by mouth daily.    . digoxin (LANOXIN) 0.125 MG tablet Take 0.125 mg by mouth daily.    Marland Kitchen diltiazem (TIAZAC) 240 MG 24 hr capsule Take 240 mg by mouth daily.    Marland Kitchen guaiFENesin (ROBITUSSIN) 100 MG/5ML SOLN Take 10 mLs by mouth every 4 (four) hours as needed for cough or to loosen phlegm.    . hydrOXYzine (VISTARIL) 25 MG capsule Take 25 mg by mouth 3 (three) times daily as needed.    . lamoTRIgine (LAMICTAL) 150 MG tablet Take 150 mg by mouth daily.    Marland Kitchen LORazepam (ATIVAN) 0.5 MG tablet Take 0.5 mg by mouth every 8 (eight) hours as needed for anxiety.    . Menthol, Topical Analgesic, (BIOFREEZE) 4 % GEL Apply 1 application topically 2 (two) times daily.    . metoprolol tartrate (LOPRESSOR) 25 MG tablet Take 25 mg by mouth 2 (two) times daily.    . metroNIDAZOLE (METROGEL) 0.75 % gel Apply 1 application topically 2 (two) times daily.    . Multiple Vitamin (MULTIVITAMIN) tablet Take 1 tablet by mouth daily.    . promethazine (PHENERGAN) 25 MG tablet Take 25 mg by mouth every 6 (six) hours as needed for nausea or vomiting.    Marland Kitchen QUEtiapine (SEROQUEL) 25 MG tablet Take 25 mg by mouth 2 (two) times daily.      Musculoskeletal: Strength & Muscle Tone: decreased Gait & Station: unable to stand Patient leans: N/A  Psychiatric Specialty Exam: Review of Systems  HENT: Negative.   Eyes: Negative.   Respiratory: Negative.   Cardiovascular: Negative.   Gastrointestinal: Negative.   Musculoskeletal: Positive for falls.  Skin: Negative.   Neurological: Positive for weakness.  Psychiatric/Behavioral: Positive for hallucinations and memory loss. Negative for depression, suicidal ideas and substance abuse. The patient is nervous/anxious and has insomnia.     Blood pressure 143/80, pulse 87, temperature 98 F (36.7 C), temperature source Oral, resp. rate 17, height 5\' 2"  (  1.575 m),  weight 52.277 kg (115 lb 4 oz), SpO2 96 %.Body mass index is 21.07 kg/(m^2).  General Appearance: Disheveled  Eye Contact::  Minimal  Speech:  Garbled  Volume:  Decreased  Mood:  Anxious  Affect:  Anxious and nervous  Thought Process:  Circumstantial, Disorganized and Tangential  Orientation:  Full (Time, Place, and Person)  Thought Content:  Paranoid Ideation  Suicidal Thoughts:  No  Homicidal Thoughts:  No  Memory:  Immediate;   Fair Recent;   Fair Remote;   Poor  Judgement:  Impaired  Insight:  Lacking  Psychomotor Activity:  Decreased  Concentration:  Poor  Recall:  [employer] of Knowledge:Fair  Language: Fair  Akathisia:  No  Handed:  Right  AIMS (if indicated):     Assets:  Desire for Improvement Financial Resources/Insurance Housing Social Support  ADL's:  Impaired  Cognition: Impaired,  Mild  Sleep:      Treatment Plan Summary: Daily contact with patient to assess and evaluate symptoms and progress in treatment, Medication management and Plan IVC patient for transfer to inpatient psychiatry.   - Continue medications as prescribed.   Disposition: Recommend psychiatric Inpatient admission when medically cleared.  Donita Brooks 01/04/2016 6:00 PM

## 2016-01-04 NOTE — ED Notes (Signed)
BEHAVIORAL HEALTH ROUNDING Patient sleeping: No. Patient alert and oriented: yes Behavior appropriate: Yes.  ; If no, describe:  Nutrition and fluids offered: Yes  Toileting and hygiene offered: Yes  Sitter present: no Law enforcement present: Yes  

## 2016-01-04 NOTE — ED Notes (Signed)
Am meds administered as ordered  Assessment completed   

## 2016-01-04 NOTE — ED Notes (Signed)
BEHAVIORAL HEALTH ROUNDING Patient sleeping: Yes.   Patient alert and oriented: eyes closed  Appears asleep Behavior appropriate: Yes.  ; If no, describe:  Nutrition and fluids offered: Yes  Toileting and hygiene offered: sleeping Sitter present: q 15 minute observations and security monitoring Law enforcement present: yes  ODS 

## 2016-01-04 NOTE — ED Notes (Signed)
She is lying in the bed  - eyes closed  Appears to be sleeping   NAD observed

## 2016-01-05 DIAGNOSIS — S0083XA Contusion of other part of head, initial encounter: Secondary | ICD-10-CM | POA: Diagnosis not present

## 2016-01-05 DIAGNOSIS — F0391 Unspecified dementia with behavioral disturbance: Secondary | ICD-10-CM | POA: Diagnosis not present

## 2016-01-05 NOTE — BHH Counselor (Addendum)
Writer contacted [facility], [phone]) to f/u on Pt bed assignment and faxed IVC paperwork. Writer was requested to resubmit paperwork due to technical difficulties with [facility]'s fax machine.

## 2016-01-05 NOTE — ED Notes (Signed)
Pt visitor notified this RN that was pt health care POA at this time and is requesting a reeval from the psych md for pt placement. HCPOA states that pt is now back at her baseline mentally and does not need inpatient psyh treatment and would like for her to go back to a nursing home.  Dr Weber Cooks in to speak with pt and POA at this time.

## 2016-01-05 NOTE — ED Notes (Signed)
Pt moved to a hospital bed for better comfort.

## 2016-01-05 NOTE — Consult Note (Signed)
Per Psych MD Dr.Clapacs, Pt still meets criteria for inpatient admission. Writer resubmitted IVC papers to [facility].

## 2016-01-05 NOTE — ED Provider Notes (Signed)
-----------------------------------------   6:08 AM on 01/05/2016 -----------------------------------------   Blood pressure 142/72, pulse 66, temperature 97.6 F (36.4 C), temperature source Oral, resp. rate 17, height 5\' 2"  (1.575 m), weight 115 lb 4 oz (52.277 kg), SpO2 96 %.  The patient had no acute events since last update.  Calm and cooperative at this time.  Disposition is pending per Psychiatry/Behavioral Medicine team recommendations.     Paulette Blanch, MD 01/05/16 [phone]

## 2016-01-05 NOTE — ED Notes (Signed)
BEHAVIORAL HEALTH ROUNDING Patient sleeping: Yes.   Patient alert and oriented: not applicable Behavior appropriate: Yes.  ; If no, describe: pt sleeping Nutrition and fluids offered: No Toileting and hygiene offered: No Sitter present: no Law enforcement present: Yes  and ODS

## 2016-01-05 NOTE — ED Notes (Signed)
Pt with visitor at bedside

## 2016-01-05 NOTE — ED Notes (Signed)
Report received from [place], [place] at the bedside. Introduced self to pt, pt sleeping, symmetrical chest rise and fall noted at this time. Pt calm and cooperative, no acute distress noted at this time.

## 2016-01-05 NOTE — ED Notes (Signed)
Report given to Lauryn, RN.  

## 2016-01-05 NOTE — Consult Note (Signed)
  Psychiatry: Follow-up for 82 year old woman with a history of dementia and behavior disturbance. I last saw the patient on Friday and at that point she was still having paranoia and confusion and was insisting that she would not go back to [facility]. I suggested referral to [facility]. Over the weekend apparently she was accepted to [facility] but then the transfer did not happen because she was not on commitment. Now that she is been placed on commitment they are asking to reevaluate her. Patient has not changed clinically. Her power of attorney was here today asking me to speak to the patient again. Patient is cheerful and pleasant but still clearly has memory problems and intermittent confusion. Unfortunately she also insistently said today that she would not go back to [facility] and that if we tried to make her we would have to force her. At this point I think we should proceed with possible transfer to [facility]. The power of attorney is asking me if she should be looking into other skilled nursing facilities. I told her that would probably be a good idea but I couldn't promise that we would be able to make any change to the plan at this point in the emergency room. No change to current medicine. Patient encouraged to continue eating and drinking well. No change otherwise. No change to diagnosis.

## 2016-01-06 DIAGNOSIS — F0391 Unspecified dementia with behavioral disturbance: Secondary | ICD-10-CM | POA: Diagnosis not present

## 2016-01-06 DIAGNOSIS — S0083XA Contusion of other part of head, initial encounter: Secondary | ICD-10-CM | POA: Diagnosis not present

## 2016-01-06 MED ORDER — QUETIAPINE FUMARATE 25 MG PO TABS
50.0000 mg | ORAL_TABLET | Freq: Every day | ORAL | Status: DC
  Administered 2016-01-06: 50 mg via ORAL
  Filled 2016-01-06: qty 2

## 2016-01-06 NOTE — ED Notes (Signed)

## 2016-01-06 NOTE — Clinical Social Work Note (Signed)
Clinical Social Worker was updated that pt's POA was concerned as pt will be transferred to [facility]. Pt's POA was concerned regarding the needs for SNF and FL2 for SNF placement Pt was admitted from [facility]. Pt was assessed by psychiatry and it was determined that pt needs a Geripsych unit. TTS has been working to admit her [facility], where she was accepted  CSW followed up with, Elmyra Ricks, with TTS, who spoke with POA and POA is aware that pt will need geripsych prior to SNF placement. Geripsych unit will address discharge to SNF. CSW provided information to TTS regarding SNF placement.   CSW will not reach out to POA at this time as situation was handled by TTS. However, is available should a need arise prior to transfer. Per Elmyra Ricks, with TTS, POA concerns have been addressed.   Pt is ready for discharge to [facility] and will likely be transported via EMS in the morning. CSW will continue to follow as needed.   Darden Dates, MSW, LCSW Clinical Social Worker  [phone]

## 2016-01-06 NOTE — Consult Note (Signed)
[facility] Consult   Reason for Consult:  Follow-up for this 82 year old woman with dementia and some paranoia and hallucinations currently in the emergency room Referring Physician:  Corky Downs Patient Identification: Heather Barker MRN:  [id number] Principal Diagnosis: Dementia with psychosis Diagnosis:   Patient Active Problem List   Diagnosis Date Noted  . Dementia with psychosis [F03.91] 01/02/2016  . Hypertension [I10] 01/02/2016  . OSTEOARTHRITIS [M19.90] 07/04/2008  . LEG PAIN [M79.609] 09/28/2007  . DEPRESSION [F32.9] 07/20/2007  . HYPERTENSION [I10] 07/20/2007  . ROSACEA [L71.9] 07/20/2007  . SPINAL STENOSIS, CERVICAL [M48.02] 07/20/2007  . URINARY INCONTINENCE [R32] 07/20/2007    Total Time spent with patient: 20 minutes  Subjective:   Heather Barker is a 82 y.o. female patient admitted with "I guess I'm okay".  HPI:  Patient interviewed. Interview focused on her current mental state and particularly on discussing disposition plans. Patient has continued to wait in the emergency room for referral to geriatric psychiatry unit in part because of her refusal to consider going back to her previous living situation. She maintains some paranoia and confused thinking about that. Patient is also continuing to report some of what I believe are hallucinations here especially at nighttime difficulty sleeping and some paranoia. Short-term memory impaired. She is not agitated not suicidal not aggressive not violent. As been receiving Seroquel 25 mg twice a day. May be having a little more sedation during the daytime than necessary  Past Psychiatric History: Patient with relatively new psychiatric symptoms. Seems to be largely related to dementia  Risk to Self: Suicidal Ideation: No Suicidal Intent: No Is patient at risk for suicide?: No Suicidal Plan?: No Access to Means: No What has been your use of drugs/alcohol within the last 12 months?: None Reported How many  times?: 0 Other Self Harm Risks: None Reported Triggers for Past Attempts: None known Intentional Self Injurious Behavior: None Risk to Others: Homicidal Ideation: No Thoughts of Harm to Others: No Current Homicidal Intent: No Current Homicidal Plan: No Access to Homicidal Means: No Identified Victim: None Reported History of harm to others?: No Assessment of Violence: None Noted Violent Behavior Description: None Reported Does patient have access to weapons?: No Criminal Charges Pending?: No Does patient have a court date: No Prior Inpatient Therapy: Prior Inpatient Therapy: No Prior Therapy Dates: None Reported Prior Therapy Facilty/Provider(s): None Reported Reason for Treatment: None Reported Prior Outpatient Therapy: Prior Outpatient Therapy: No Prior Therapy Dates: None Reported Prior Therapy Facilty/Provider(s): None Reported Reason for Treatment: None Reported Does patient have an ACCT team?: No Does patient have Intensive In-House Services?  : No Does patient have [facility] services? : No Does patient have P4CC services?: No  Past Medical History:  Past Medical History  Diagnosis Date  . Depression   . Hypertension   . Urinary incontinence   . Basal cell cancer 1990's    forehead  . Rosacea   . Stenosis, cervical spine   . Osteoarthritis   . Atrial fibrillation ([place])   . Psychosis     Past Surgical History  Procedure Laterality Date  . Appendectomy    . Dilation and curettage of uterus  1970's  . Cataract extraction      left eye  . Knee arthroscopy  1996    right   . Rotator cuff repair      left torn  . Cataract extraction  07/2010    right, IOL   Family History:  Family History  Problem Relation Age  of Onset  . Cancer Mother     lung  . Hypertension Mother   . Heart disease Father   . Hypertension Father   . Diabetes Neg Hx    Family Psychiatric  History: Nonidentified Social History:  History  Alcohol Use  . Yes    Comment: occasional      History  Drug Use Not on file    Social History   Social History  . Marital Status: Widowed    Spouse Name: N/A  . Number of Children: 2  . Years of Education: N/A   Occupational History  . retired housewife, Psychologist, occupational, [employer]    Social History Main Topics  . Smoking status: Never Smoker   . Smokeless tobacco: Never Used  . Alcohol Use: Yes     Comment: occasional  . Drug Use: None  . Sexual Activity: Not Asked   Other Topics Concern  . None   Social History Narrative   Widowed twice   Now with a gentleman friend again since 6/09   2 adoptedchildren: One living, one deceased         Additional Social History:    Pain Medications: See PTA Prescriptions: See PTA Over the Counter: See PTA History of alcohol / drug use?: No history of alcohol / drug abuse Longest period of sobriety (when/how long): No History of abuse Negative Consequences of Use:  (No History of abuse) Withdrawal Symptoms:  (No History of abuse)                     Allergies:   Allergies  Allergen Reactions  . Codeine Phosphate     REACTION: intolerance--hydrocodone okay  . Meperidine Hcl     REACTION: unspecified  . Penicillins     Labs: No results found for this or any previous visit (from the past 48 hour(s)).  Current Facility-Administered Medications  Medication Dose Route Frequency Provider Last Rate Last Dose  . acetaminophen (TYLENOL) tablet 650 mg  650 mg Oral Q4H PRN Hinda Kehr, MD   650 mg at 01/06/16 1026  . aspirin EC tablet 81 mg  81 mg Oral Daily Hinda Kehr, MD   81 mg at 01/06/16 1019  . cephALEXin (KEFLEX) capsule 500 mg  500 mg Oral Q12H Hinda Kehr, MD   500 mg at 01/06/16 1027  . digoxin (LANOXIN) tablet 0.125 mg  0.125 mg Oral Daily Hinda Kehr, MD   0.125 mg at 01/06/16 1019  . diltiazem (TIAZAC) 24 hr capsule 240 mg  240 mg Oral Daily Hinda Kehr, MD   240 mg at 01/06/16 1021  . lamoTRIgine (LAMICTAL) tablet 150 mg  150 mg Oral Daily Hinda Kehr, MD   150 mg at 01/04/16 1026  . metoprolol tartrate (LOPRESSOR) tablet 25 mg  25 mg Oral BID Hinda Kehr, MD   25 mg at 01/06/16 1022  . multivitamin with minerals tablet 1 tablet  1 tablet Oral Daily Hinda Kehr, MD   1 tablet at 01/06/16 1022  . QUEtiapine (SEROQUEL) tablet 50 mg  50 mg Oral QHS Gonzella Lex, MD       Current Outpatient Prescriptions  Medication Sig Dispense Refill  . acetaminophen (TYLENOL) 325 MG tablet Take 650 mg by mouth every 4 (four) hours as needed.    Marland Kitchen amoxicillin (AMOXIL) 500 MG capsule Take 500 mg by mouth 2 (two) times daily.    Marland Kitchen aspirin EC 81 MG tablet Take 81 mg by mouth daily.    Marland Kitchen  digoxin (LANOXIN) 0.125 MG tablet Take 0.125 mg by mouth daily.    Marland Kitchen diltiazem (TIAZAC) 240 MG 24 hr capsule Take 240 mg by mouth daily.    Marland Kitchen guaiFENesin (ROBITUSSIN) 100 MG/5ML SOLN Take 10 mLs by mouth every 4 (four) hours as needed for cough or to loosen phlegm.    . hydrOXYzine (VISTARIL) 25 MG capsule Take 25 mg by mouth 3 (three) times daily as needed.    . lamoTRIgine (LAMICTAL) 150 MG tablet Take 150 mg by mouth daily.    Marland Kitchen LORazepam (ATIVAN) 0.5 MG tablet Take 0.5 mg by mouth every 8 (eight) hours as needed for anxiety.    . Menthol, Topical Analgesic, (BIOFREEZE) 4 % GEL Apply 1 application topically 2 (two) times daily.    . metoprolol tartrate (LOPRESSOR) 25 MG tablet Take 25 mg by mouth 2 (two) times daily.    . metroNIDAZOLE (METROGEL) 0.75 % gel Apply 1 application topically 2 (two) times daily.    . Multiple Vitamin (MULTIVITAMIN) tablet Take 1 tablet by mouth daily.    . promethazine (PHENERGAN) 25 MG tablet Take 25 mg by mouth every 6 (six) hours as needed for nausea or vomiting.    Marland Kitchen QUEtiapine (SEROQUEL) 25 MG tablet Take 25 mg by mouth 2 (two) times daily.      Musculoskeletal: Strength & Muscle Tone: decreased Gait & Station: ataxic Patient leans: N/A  Psychiatric Specialty Exam: Review of Systems  HENT: Negative.   Eyes: Negative.    Respiratory: Negative.   Cardiovascular: Negative.   Gastrointestinal: Negative.   Musculoskeletal: Negative.   Skin: Negative.   Neurological: Positive for weakness.  Psychiatric/Behavioral: Positive for hallucinations and memory loss. Negative for depression, suicidal ideas and substance abuse. The patient is nervous/anxious and has insomnia.     Blood pressure 139/77, pulse 73, temperature 98 F (36.7 C), temperature source Oral, resp. rate 16, height 5\' 2"  (1.575 m), weight 52.277 kg (115 lb 4 oz), SpO2 97 %.Body mass index is 21.07 kg/(m^2).  General Appearance: Casual  Eye Contact::  Fair  Speech:  Slow  Volume:  Decreased  Mood:  Anxious  Affect:  Constricted  Thought Process:  Tangential  Orientation:  Full (Time, Place, and Person)  Thought Content:  Hallucinations: Auditory  Suicidal Thoughts:  No  Homicidal Thoughts:  No  Memory:  Negative  Judgement:  Impaired  Insight:  Shallow  Psychomotor Activity:  Psychomotor Retardation  Concentration:  Poor  Recall:  Poor  Fund of Knowledge:Poor  Language: Fair  Akathisia:  No  Handed:  Right  AIMS (if indicated):     Assets:  Agricultural consultant Social Support  ADL's:  Impaired  Cognition: Impaired,  Mild  Sleep:      Treatment Plan Summary: Medication management and Plan Patient continues to be in the emergency room under IVC papers. Over the weekend we had felt that we had a acceptance to [facility] but that appears to of been withdrawn. Patient now is waiting for some appropriate referral. I have suggested to the patient that she returned to [facility] and she repeatedly refuses to consider this. We have discussed this with her power of attorney. Patient appears to still be somewhat paranoid and has told us that she will actively resist going back to the place where she was living before. Her level of understanding is probably not very high. I am concerned about her continued  presence in the emergency room putting her at risk for more medical problems.  I have strongly encouraged the patient to allow [place] to discharge her home but she refuses. We are continuing to search for geropsychiatry units. Meanwhile I have altered medicine to 50 mg of Seroquel at night only and not during the day to hopefully improve her sleep and make her less sedated during the day.  Disposition: Recommend psychiatric Inpatient admission when medically cleared.  John Clapacs 01/06/2016 3:18 PM

## 2016-01-06 NOTE — ED Notes (Signed)
Pt resting comfortably with eyes closed no distress noted, awaiting placement to geriatric psych facility.

## 2016-01-06 NOTE — ED Notes (Signed)
Patient resting comfortably in room. No complaints or concerns voiced. No distress or abnormal behavior noted. Will continue to monitor with security cameras. Q 15 minute rounds continue.

## 2016-01-06 NOTE — BH Specialist Note (Signed)
Heather Barker has been accepted to [facility].  TTS spoke with Anderson Malta at [facility].  The patient has been accepted by Dr. Geanie Kenning.  Please call report to [phone].  Patient is to report to the 4th floor.

## 2016-01-06 NOTE — ED Provider Notes (Signed)
-----------------------------------------   6:42 AM on 01/06/2016 -----------------------------------------   Blood pressure 118/97, pulse 60, temperature 97.9 F (36.6 C), temperature source Oral, resp. rate 16, height 5\' 2"  (1.575 m), weight 115 lb 4 oz (52.277 kg), SpO2 97 %.  The patient had no acute events since last update.  Calm and cooperative at this time.    The patient has been accepted to [facility] and is currently awaiting transport.     Loney Hering, MD 01/06/16 [phone]

## 2016-01-06 NOTE — Progress Notes (Signed)
TTS has been informed that EMS will not be providing transport on today due to inclement weather. The pt is scheduled to be transported on [employer]. This information has been shared with the referral source. Representative- Deedra Ehrich.   01/06/2016 Con Memos, MS, [place], [facility] Therapeutic Triage Specialist

## 2016-01-06 NOTE — ED Notes (Signed)
BEHAVIORAL HEALTH ROUNDING Patient sleeping: Yes.   Patient alert and oriented: no Behavior appropriate: Yes.  ; If no, describe:  Nutrition and fluids offered: Yes  Toileting and hygiene offered: Yes  Sitter present: no Law enforcement present: Yes  

## 2016-01-06 NOTE — Progress Notes (Addendum)
TTS has had an in-depth discussion with the pts [place], [place].  The pts POA was advised that per the psychiatrist he is more than willing to rescind the pts IVC if the pt decides to return to the skilled living facility that she was previously in. TTS has explained to the POA that the pt has been approved for a bed at [facility] and is scheduled for transport on tomorrow. TTS educated the pts HPOA on the referral process.The pts POA has expressed that she feels that the pt has returned to her baseline and that she may not be in need of psychiatric hospitalization. TTS has informed the POA that the pt is now under IVC, and explained in detail what this ment in terms of treatment and pt care. TTS adressed all clinical question presented by pts POA and assured her that she would bed contacted with any additional clinical updates.   01/06/2016 Con Memos, MS, [place], [facility] Therapeutic Triage Specialist

## 2016-01-06 NOTE — ED Notes (Addendum)
Report received from [facility]. Patient care assumed. Patient/RN introduction complete. Will continue to monitor, q 15 min checks being done.

## 2016-01-06 NOTE — ED Notes (Signed)
Pt accepted to [facility] Waiting on [place] get [place] the [facility]

## 2016-01-06 NOTE — ED Notes (Signed)
BEHAVIORAL HEALTH ROUNDING Patient sleeping: No. Patient alert and oriented: no Behavior appropriate: Yes.  ; If no, describe:  Nutrition and fluids offered: Yes  Toileting and hygiene offered: Yes  Sitter present: no Law enforcement present: Yes  and No

## 2016-01-06 NOTE — ED Notes (Signed)
Patient assigned to appropriate care area. Patient oriented to unit/care area: Informed that, for their safety, care areas are designed for safety and monitored by security cameras at all times; and visiting hours explained to patient. Patient verbalizes understanding, and verbal contract for safety obtained. 

## 2016-01-06 NOTE — ED Notes (Signed)
BEHAVIORAL HEALTH ROUNDING Patient sleeping: No. Patient alert and oriented: no Behavior appropriate: Yes.  ; If no, describe:  Nutrition and fluids offered: Yes  Toileting and hygiene offered: Yes  Sitter present: no Law enforcement present: Yes  

## 2016-01-07 DIAGNOSIS — Z880 Allergy status to penicillin: Secondary | ICD-10-CM | POA: Diagnosis not present

## 2016-01-07 DIAGNOSIS — R296 Repeated falls: Secondary | ICD-10-CM | POA: Diagnosis not present

## 2016-01-07 DIAGNOSIS — S0003XA Contusion of scalp, initial encounter: Secondary | ICD-10-CM | POA: Diagnosis not present

## 2016-01-07 DIAGNOSIS — Z7982 Long term (current) use of aspirin: Secondary | ICD-10-CM | POA: Diagnosis not present

## 2016-01-07 DIAGNOSIS — I4891 Unspecified atrial fibrillation: Secondary | ICD-10-CM | POA: Diagnosis not present

## 2016-01-07 DIAGNOSIS — Z85828 Personal history of other malignant neoplasm of skin: Secondary | ICD-10-CM | POA: Diagnosis not present

## 2016-01-07 DIAGNOSIS — I083 Combined rheumatic disorders of mitral, aortic and tricuspid valves: Secondary | ICD-10-CM | POA: Diagnosis not present

## 2016-01-07 DIAGNOSIS — R2689 Other abnormalities of gait and mobility: Secondary | ICD-10-CM | POA: Diagnosis not present

## 2016-01-07 DIAGNOSIS — Z792 Long term (current) use of antibiotics: Secondary | ICD-10-CM | POA: Diagnosis not present

## 2016-01-07 DIAGNOSIS — I517 Cardiomegaly: Secondary | ICD-10-CM | POA: Diagnosis not present

## 2016-01-07 DIAGNOSIS — G8929 Other chronic pain: Secondary | ICD-10-CM | POA: Diagnosis not present

## 2016-01-07 DIAGNOSIS — Z96619 Presence of unspecified artificial shoulder joint: Secondary | ICD-10-CM | POA: Diagnosis present

## 2016-01-07 DIAGNOSIS — M1280 Other specific arthropathies, not elsewhere classified, unspecified site: Secondary | ICD-10-CM | POA: Diagnosis not present

## 2016-01-07 DIAGNOSIS — Z79899 Other long term (current) drug therapy: Secondary | ICD-10-CM | POA: Diagnosis not present

## 2016-01-07 DIAGNOSIS — C4491 Basal cell carcinoma of skin, unspecified: Secondary | ICD-10-CM | POA: Diagnosis not present

## 2016-01-07 DIAGNOSIS — R001 Bradycardia, unspecified: Secondary | ICD-10-CM | POA: Diagnosis not present

## 2016-01-07 DIAGNOSIS — S0083XA Contusion of other part of head, initial encounter: Secondary | ICD-10-CM | POA: Diagnosis not present

## 2016-01-07 DIAGNOSIS — Z888 Allergy status to other drugs, medicaments and biological substances status: Secondary | ICD-10-CM | POA: Diagnosis not present

## 2016-01-07 DIAGNOSIS — R531 Weakness: Secondary | ICD-10-CM | POA: Diagnosis not present

## 2016-01-07 DIAGNOSIS — L719 Rosacea, unspecified: Secondary | ICD-10-CM | POA: Diagnosis not present

## 2016-01-07 DIAGNOSIS — R41841 Cognitive communication deficit: Secondary | ICD-10-CM | POA: Diagnosis not present

## 2016-01-07 DIAGNOSIS — F339 Major depressive disorder, recurrent, unspecified: Secondary | ICD-10-CM | POA: Diagnosis not present

## 2016-01-07 DIAGNOSIS — M545 Low back pain: Secondary | ICD-10-CM | POA: Diagnosis present

## 2016-01-07 DIAGNOSIS — I5030 Unspecified diastolic (congestive) heart failure: Secondary | ICD-10-CM | POA: Diagnosis present

## 2016-01-07 DIAGNOSIS — F29 Unspecified psychosis not due to a substance or known physiological condition: Secondary | ICD-10-CM | POA: Diagnosis not present

## 2016-01-07 DIAGNOSIS — Z885 Allergy status to narcotic agent status: Secondary | ICD-10-CM | POA: Diagnosis not present

## 2016-01-07 DIAGNOSIS — R443 Hallucinations, unspecified: Secondary | ICD-10-CM | POA: Diagnosis not present

## 2016-01-07 DIAGNOSIS — F0391 Unspecified dementia with behavioral disturbance: Secondary | ICD-10-CM | POA: Diagnosis not present

## 2016-01-07 DIAGNOSIS — I5032 Chronic diastolic (congestive) heart failure: Secondary | ICD-10-CM | POA: Diagnosis not present

## 2016-01-07 DIAGNOSIS — I10 Essential (primary) hypertension: Secondary | ICD-10-CM | POA: Diagnosis not present

## 2016-01-07 DIAGNOSIS — S0990XA Unspecified injury of head, initial encounter: Secondary | ICD-10-CM | POA: Diagnosis present

## 2016-01-07 DIAGNOSIS — T50905A Adverse effect of unspecified drugs, medicaments and biological substances, initial encounter: Secondary | ICD-10-CM | POA: Diagnosis not present

## 2016-01-07 DIAGNOSIS — N3001 Acute cystitis with hematuria: Secondary | ICD-10-CM | POA: Diagnosis not present

## 2016-01-07 DIAGNOSIS — Z9181 History of falling: Secondary | ICD-10-CM | POA: Diagnosis not present

## 2016-01-07 DIAGNOSIS — R079 Chest pain, unspecified: Secondary | ICD-10-CM | POA: Diagnosis not present

## 2016-01-07 DIAGNOSIS — S0090XA Unspecified superficial injury of unspecified part of head, initial encounter: Secondary | ICD-10-CM | POA: Diagnosis not present

## 2016-01-07 DIAGNOSIS — F329 Major depressive disorder, single episode, unspecified: Secondary | ICD-10-CM | POA: Diagnosis present

## 2016-01-07 DIAGNOSIS — I482 Chronic atrial fibrillation: Secondary | ICD-10-CM | POA: Diagnosis not present

## 2016-01-07 DIAGNOSIS — I272 Other secondary pulmonary hypertension: Secondary | ICD-10-CM | POA: Diagnosis present

## 2016-01-07 DIAGNOSIS — F23 Brief psychotic disorder: Secondary | ICD-10-CM | POA: Diagnosis not present

## 2016-01-07 DIAGNOSIS — F0390 Unspecified dementia without behavioral disturbance: Secondary | ICD-10-CM | POA: Diagnosis present

## 2016-01-07 DIAGNOSIS — B373 Candidiasis of vulva and vagina: Secondary | ICD-10-CM | POA: Diagnosis not present

## 2016-01-07 DIAGNOSIS — Z9889 Other specified postprocedural states: Secondary | ICD-10-CM | POA: Diagnosis not present

## 2016-01-07 DIAGNOSIS — M4802 Spinal stenosis, cervical region: Secondary | ICD-10-CM | POA: Diagnosis not present

## 2016-01-07 DIAGNOSIS — S29001A Unspecified injury of muscle and tendon of front wall of thorax, initial encounter: Secondary | ICD-10-CM | POA: Diagnosis not present

## 2016-01-07 DIAGNOSIS — M25552 Pain in left hip: Secondary | ICD-10-CM | POA: Diagnosis not present

## 2016-01-07 DIAGNOSIS — R4182 Altered mental status, unspecified: Secondary | ICD-10-CM | POA: Diagnosis not present

## 2016-01-07 DIAGNOSIS — I350 Nonrheumatic aortic (valve) stenosis: Secondary | ICD-10-CM | POA: Diagnosis present

## 2016-01-07 DIAGNOSIS — K5901 Slow transit constipation: Secondary | ICD-10-CM | POA: Diagnosis present

## 2016-01-07 DIAGNOSIS — R279 Unspecified lack of coordination: Secondary | ICD-10-CM | POA: Diagnosis not present

## 2016-01-07 DIAGNOSIS — R319 Hematuria, unspecified: Secondary | ICD-10-CM | POA: Diagnosis not present

## 2016-01-07 DIAGNOSIS — I509 Heart failure, unspecified: Secondary | ICD-10-CM | POA: Diagnosis not present

## 2016-01-07 DIAGNOSIS — I481 Persistent atrial fibrillation: Secondary | ICD-10-CM | POA: Diagnosis not present

## 2016-01-07 DIAGNOSIS — G44309 Post-traumatic headache, unspecified, not intractable: Secondary | ICD-10-CM | POA: Diagnosis not present

## 2016-01-07 DIAGNOSIS — G308 Other Alzheimer's disease: Secondary | ICD-10-CM | POA: Diagnosis not present

## 2016-01-07 DIAGNOSIS — E440 Moderate protein-calorie malnutrition: Secondary | ICD-10-CM | POA: Diagnosis not present

## 2016-01-07 DIAGNOSIS — N390 Urinary tract infection, site not specified: Secondary | ICD-10-CM | POA: Diagnosis not present

## 2016-01-07 DIAGNOSIS — F0281 Dementia in other diseases classified elsewhere with behavioral disturbance: Secondary | ICD-10-CM | POA: Diagnosis not present

## 2016-01-07 NOTE — ED Provider Notes (Signed)
-----------------------------------------   3:58 PM on 01/07/2016 -----------------------------------------  Patient accepted to [facility]. Dr. Ronnald Ramp is the accepting physician. Vital signs stable.  Joanne Gavel, MD 01/07/16 [phone]

## 2016-01-07 NOTE — Consult Note (Signed)
  Psychiatry: Follow-up for this 82 year old woman currently in the emergency room and on involuntary commitment. Unfortunately woman continues to insist that she will not go back to [facility]. Maintains paranoid stance towards it. She continues to have unrealistic ideas of what is possible as far as disposition. She has not been aggressive or threatening here. Physically no new complaints. Yesterday I changed her medicines a little bit to cut down on her morning amounts of Seroquel. She seems to be awake and alert today. Otherwise mental state not significantly changed.  I have tried to make it clear to the patient and her power of attorney that at this point we really only have 2 options, return to the place where she was living before or transfer to inpatient psychiatric unit if the former is not possible. Patient does not need emergency room treatment and it is not appropriate for her to continue to wait for days in the emergency room. Patient can be transferred to [facility] for further evaluation and treatment of her psychotic symptoms and dementia and then placement can be pursued from then. No other change to treatment plan today.

## 2016-01-07 NOTE — ED Notes (Signed)
Pt resting comfortably in room. Environment secure with Engineer, site at bedside.

## 2016-01-07 NOTE — ED Notes (Signed)
BEHAVIORAL HEALTH ROUNDING Patient sleeping: No. Patient alert and oriented: yes Behavior appropriate: Yes.  ; If no, describe:  Nutrition and fluids offered: Yes  Toileting and hygiene offered: Yes  Sitter present: no Law enforcement present: Yes  

## 2016-01-07 NOTE — ED Provider Notes (Signed)
-----------------------------------------   7:37 AM on 01/07/2016 -----------------------------------------   Blood pressure 125/83, pulse 62, temperature 97.9 F (36.6 C), temperature source Oral, resp. rate 14, height 5\' 2"  (1.575 m), weight 115 lb 4 oz (52.277 kg), SpO2 96 %.  The patient had no acute events since last update.  Calm and cooperative at this time.  Disposition is pending .   Orbie Pyo, MD 01/07/16 [phone]

## 2016-01-07 NOTE — ED Notes (Signed)

## 2016-01-07 NOTE — ED Notes (Signed)
Attempted to stand pt with assist.  Pt is unable   Fights against the staff   Afraid she going to fall. Assured pt that she was safe  Able to sit up w/o diff

## 2016-01-27 DIAGNOSIS — B952 Enterococcus as the cause of diseases classified elsewhere: Secondary | ICD-10-CM | POA: Diagnosis not present

## 2016-01-27 DIAGNOSIS — I5032 Chronic diastolic (congestive) heart failure: Secondary | ICD-10-CM | POA: Diagnosis not present

## 2016-01-27 DIAGNOSIS — R6889 Other general symptoms and signs: Secondary | ICD-10-CM | POA: Diagnosis not present

## 2016-01-27 DIAGNOSIS — Z885 Allergy status to narcotic agent status: Secondary | ICD-10-CM | POA: Diagnosis not present

## 2016-01-27 DIAGNOSIS — J189 Pneumonia, unspecified organism: Secondary | ICD-10-CM | POA: Diagnosis not present

## 2016-01-27 DIAGNOSIS — F339 Major depressive disorder, recurrent, unspecified: Secondary | ICD-10-CM | POA: Diagnosis not present

## 2016-01-27 DIAGNOSIS — Z7982 Long term (current) use of aspirin: Secondary | ICD-10-CM | POA: Diagnosis not present

## 2016-01-27 DIAGNOSIS — F0391 Unspecified dementia with behavioral disturbance: Secondary | ICD-10-CM | POA: Diagnosis not present

## 2016-01-27 DIAGNOSIS — M1280 Other specific arthropathies, not elsewhere classified, unspecified site: Secondary | ICD-10-CM | POA: Diagnosis not present

## 2016-01-27 DIAGNOSIS — R001 Bradycardia, unspecified: Secondary | ICD-10-CM | POA: Diagnosis not present

## 2016-01-27 DIAGNOSIS — R296 Repeated falls: Secondary | ICD-10-CM | POA: Diagnosis not present

## 2016-01-27 DIAGNOSIS — S098XXA Other specified injuries of head, initial encounter: Secondary | ICD-10-CM | POA: Diagnosis not present

## 2016-01-27 DIAGNOSIS — M4802 Spinal stenosis, cervical region: Secondary | ICD-10-CM | POA: Diagnosis not present

## 2016-01-27 DIAGNOSIS — M6281 Muscle weakness (generalized): Secondary | ICD-10-CM | POA: Diagnosis not present

## 2016-01-27 DIAGNOSIS — B373 Candidiasis of vulva and vagina: Secondary | ICD-10-CM | POA: Diagnosis not present

## 2016-01-27 DIAGNOSIS — Z888 Allergy status to other drugs, medicaments and biological substances status: Secondary | ICD-10-CM | POA: Diagnosis not present

## 2016-01-27 DIAGNOSIS — F0281 Dementia in other diseases classified elsewhere with behavioral disturbance: Secondary | ICD-10-CM | POA: Diagnosis not present

## 2016-01-27 DIAGNOSIS — R4182 Altered mental status, unspecified: Secondary | ICD-10-CM | POA: Diagnosis not present

## 2016-01-27 DIAGNOSIS — R2689 Other abnormalities of gait and mobility: Secondary | ICD-10-CM | POA: Diagnosis not present

## 2016-01-27 DIAGNOSIS — N390 Urinary tract infection, site not specified: Secondary | ICD-10-CM | POA: Diagnosis not present

## 2016-01-27 DIAGNOSIS — J9601 Acute respiratory failure with hypoxia: Secondary | ICD-10-CM | POA: Diagnosis not present

## 2016-01-27 DIAGNOSIS — I350 Nonrheumatic aortic (valve) stenosis: Secondary | ICD-10-CM | POA: Diagnosis not present

## 2016-01-27 DIAGNOSIS — C4491 Basal cell carcinoma of skin, unspecified: Secondary | ICD-10-CM | POA: Diagnosis not present

## 2016-01-27 DIAGNOSIS — D62 Acute posthemorrhagic anemia: Secondary | ICD-10-CM | POA: Diagnosis not present

## 2016-01-27 DIAGNOSIS — I10 Essential (primary) hypertension: Secondary | ICD-10-CM | POA: Diagnosis present

## 2016-01-27 DIAGNOSIS — R279 Unspecified lack of coordination: Secondary | ICD-10-CM | POA: Diagnosis not present

## 2016-01-27 DIAGNOSIS — J101 Influenza due to other identified influenza virus with other respiratory manifestations: Secondary | ICD-10-CM | POA: Diagnosis not present

## 2016-01-27 DIAGNOSIS — I509 Heart failure, unspecified: Secondary | ICD-10-CM | POA: Diagnosis not present

## 2016-01-27 DIAGNOSIS — I482 Chronic atrial fibrillation: Secondary | ICD-10-CM | POA: Diagnosis not present

## 2016-01-27 DIAGNOSIS — K5901 Slow transit constipation: Secondary | ICD-10-CM | POA: Diagnosis not present

## 2016-01-27 DIAGNOSIS — B964 Proteus (mirabilis) (morganii) as the cause of diseases classified elsewhere: Secondary | ICD-10-CM | POA: Diagnosis not present

## 2016-01-27 DIAGNOSIS — R41841 Cognitive communication deficit: Secondary | ICD-10-CM | POA: Diagnosis not present

## 2016-01-27 DIAGNOSIS — F329 Major depressive disorder, single episode, unspecified: Secondary | ICD-10-CM | POA: Diagnosis not present

## 2016-01-27 DIAGNOSIS — R262 Difficulty in walking, not elsewhere classified: Secondary | ICD-10-CM | POA: Diagnosis not present

## 2016-01-27 DIAGNOSIS — I4891 Unspecified atrial fibrillation: Secondary | ICD-10-CM | POA: Diagnosis not present

## 2016-01-27 DIAGNOSIS — R509 Fever, unspecified: Secondary | ICD-10-CM | POA: Diagnosis not present

## 2016-01-27 DIAGNOSIS — I481 Persistent atrial fibrillation: Secondary | ICD-10-CM | POA: Diagnosis not present

## 2016-01-27 DIAGNOSIS — G44309 Post-traumatic headache, unspecified, not intractable: Secondary | ICD-10-CM | POA: Diagnosis not present

## 2016-01-27 DIAGNOSIS — J069 Acute upper respiratory infection, unspecified: Secondary | ICD-10-CM | POA: Diagnosis not present

## 2016-01-27 DIAGNOSIS — L719 Rosacea, unspecified: Secondary | ICD-10-CM | POA: Diagnosis not present

## 2016-01-27 DIAGNOSIS — D649 Anemia, unspecified: Secondary | ICD-10-CM | POA: Diagnosis present

## 2016-01-27 DIAGNOSIS — F0151 Vascular dementia with behavioral disturbance: Secondary | ICD-10-CM | POA: Diagnosis not present

## 2016-01-27 DIAGNOSIS — I5030 Unspecified diastolic (congestive) heart failure: Secondary | ICD-10-CM | POA: Diagnosis not present

## 2016-01-27 DIAGNOSIS — F419 Anxiety disorder, unspecified: Secondary | ICD-10-CM | POA: Diagnosis not present

## 2016-01-27 DIAGNOSIS — Z79899 Other long term (current) drug therapy: Secondary | ICD-10-CM | POA: Diagnosis not present

## 2016-01-27 DIAGNOSIS — E440 Moderate protein-calorie malnutrition: Secondary | ICD-10-CM | POA: Diagnosis not present

## 2016-01-27 DIAGNOSIS — Z880 Allergy status to penicillin: Secondary | ICD-10-CM | POA: Diagnosis not present

## 2016-01-27 DIAGNOSIS — R531 Weakness: Secondary | ICD-10-CM | POA: Diagnosis not present

## 2016-01-27 DIAGNOSIS — R918 Other nonspecific abnormal finding of lung field: Secondary | ICD-10-CM | POA: Diagnosis not present

## 2016-01-28 DIAGNOSIS — F0391 Unspecified dementia with behavioral disturbance: Secondary | ICD-10-CM | POA: Diagnosis not present

## 2016-01-28 DIAGNOSIS — F329 Major depressive disorder, single episode, unspecified: Secondary | ICD-10-CM | POA: Diagnosis not present

## 2016-01-28 DIAGNOSIS — S098XXA Other specified injuries of head, initial encounter: Secondary | ICD-10-CM | POA: Diagnosis not present

## 2016-01-28 DIAGNOSIS — F419 Anxiety disorder, unspecified: Secondary | ICD-10-CM | POA: Diagnosis not present

## 2016-01-28 DIAGNOSIS — I10 Essential (primary) hypertension: Secondary | ICD-10-CM | POA: Diagnosis not present

## 2016-01-28 DIAGNOSIS — I5032 Chronic diastolic (congestive) heart failure: Secondary | ICD-10-CM | POA: Diagnosis not present

## 2016-01-28 DIAGNOSIS — I4891 Unspecified atrial fibrillation: Secondary | ICD-10-CM | POA: Diagnosis not present

## 2016-01-30 DIAGNOSIS — F419 Anxiety disorder, unspecified: Secondary | ICD-10-CM | POA: Diagnosis not present

## 2016-01-30 DIAGNOSIS — F329 Major depressive disorder, single episode, unspecified: Secondary | ICD-10-CM | POA: Diagnosis not present

## 2016-01-30 DIAGNOSIS — F0391 Unspecified dementia with behavioral disturbance: Secondary | ICD-10-CM | POA: Diagnosis not present

## 2016-01-30 DIAGNOSIS — I5032 Chronic diastolic (congestive) heart failure: Secondary | ICD-10-CM | POA: Diagnosis not present

## 2016-01-30 DIAGNOSIS — I4891 Unspecified atrial fibrillation: Secondary | ICD-10-CM | POA: Diagnosis not present

## 2016-01-30 DIAGNOSIS — S098XXA Other specified injuries of head, initial encounter: Secondary | ICD-10-CM | POA: Diagnosis not present

## 2016-02-02 DIAGNOSIS — M6281 Muscle weakness (generalized): Secondary | ICD-10-CM | POA: Diagnosis not present

## 2016-02-03 DIAGNOSIS — F0391 Unspecified dementia with behavioral disturbance: Secondary | ICD-10-CM | POA: Diagnosis not present

## 2016-02-03 DIAGNOSIS — F419 Anxiety disorder, unspecified: Secondary | ICD-10-CM | POA: Diagnosis not present

## 2016-02-03 DIAGNOSIS — I5032 Chronic diastolic (congestive) heart failure: Secondary | ICD-10-CM | POA: Diagnosis not present

## 2016-02-03 DIAGNOSIS — S098XXA Other specified injuries of head, initial encounter: Secondary | ICD-10-CM | POA: Diagnosis not present

## 2016-02-03 DIAGNOSIS — F329 Major depressive disorder, single episode, unspecified: Secondary | ICD-10-CM | POA: Diagnosis not present

## 2016-02-03 DIAGNOSIS — I4891 Unspecified atrial fibrillation: Secondary | ICD-10-CM | POA: Diagnosis not present

## 2016-02-11 DIAGNOSIS — F339 Major depressive disorder, recurrent, unspecified: Secondary | ICD-10-CM | POA: Diagnosis not present

## 2016-02-11 DIAGNOSIS — I10 Essential (primary) hypertension: Secondary | ICD-10-CM | POA: Diagnosis not present

## 2016-02-11 DIAGNOSIS — M6281 Muscle weakness (generalized): Secondary | ICD-10-CM | POA: Diagnosis not present

## 2016-02-11 DIAGNOSIS — I509 Heart failure, unspecified: Secondary | ICD-10-CM | POA: Diagnosis not present

## 2016-02-11 DIAGNOSIS — R489 Unspecified symbolic dysfunctions: Secondary | ICD-10-CM | POA: Diagnosis not present

## 2016-02-11 DIAGNOSIS — J9601 Acute respiratory failure with hypoxia: Secondary | ICD-10-CM | POA: Diagnosis not present

## 2016-02-11 DIAGNOSIS — I482 Chronic atrial fibrillation: Secondary | ICD-10-CM | POA: Diagnosis not present

## 2016-02-11 DIAGNOSIS — Z888 Allergy status to other drugs, medicaments and biological substances status: Secondary | ICD-10-CM | POA: Diagnosis not present

## 2016-02-11 DIAGNOSIS — Z885 Allergy status to narcotic agent status: Secondary | ICD-10-CM | POA: Diagnosis not present

## 2016-02-11 DIAGNOSIS — D62 Acute posthemorrhagic anemia: Secondary | ICD-10-CM | POA: Diagnosis not present

## 2016-02-11 DIAGNOSIS — R296 Repeated falls: Secondary | ICD-10-CM | POA: Diagnosis not present

## 2016-02-11 DIAGNOSIS — C4491 Basal cell carcinoma of skin, unspecified: Secondary | ICD-10-CM | POA: Diagnosis not present

## 2016-02-11 DIAGNOSIS — G44309 Post-traumatic headache, unspecified, not intractable: Secondary | ICD-10-CM | POA: Diagnosis not present

## 2016-02-11 DIAGNOSIS — R509 Fever, unspecified: Secondary | ICD-10-CM | POA: Diagnosis not present

## 2016-02-11 DIAGNOSIS — B373 Candidiasis of vulva and vagina: Secondary | ICD-10-CM | POA: Diagnosis not present

## 2016-02-11 DIAGNOSIS — D649 Anemia, unspecified: Secondary | ICD-10-CM | POA: Diagnosis present

## 2016-02-11 DIAGNOSIS — M1280 Other specific arthropathies, not elsewhere classified, unspecified site: Secondary | ICD-10-CM | POA: Diagnosis not present

## 2016-02-11 DIAGNOSIS — R001 Bradycardia, unspecified: Secondary | ICD-10-CM | POA: Diagnosis not present

## 2016-02-11 DIAGNOSIS — I5030 Unspecified diastolic (congestive) heart failure: Secondary | ICD-10-CM | POA: Diagnosis not present

## 2016-02-11 DIAGNOSIS — R918 Other nonspecific abnormal finding of lung field: Secondary | ICD-10-CM | POA: Diagnosis not present

## 2016-02-11 DIAGNOSIS — J189 Pneumonia, unspecified organism: Secondary | ICD-10-CM | POA: Diagnosis not present

## 2016-02-11 DIAGNOSIS — R531 Weakness: Secondary | ICD-10-CM | POA: Diagnosis not present

## 2016-02-11 DIAGNOSIS — J101 Influenza due to other identified influenza virus with other respiratory manifestations: Secondary | ICD-10-CM | POA: Diagnosis not present

## 2016-02-11 DIAGNOSIS — B964 Proteus (mirabilis) (morganii) as the cause of diseases classified elsewhere: Secondary | ICD-10-CM | POA: Diagnosis not present

## 2016-02-11 DIAGNOSIS — R6889 Other general symptoms and signs: Secondary | ICD-10-CM | POA: Diagnosis not present

## 2016-02-11 DIAGNOSIS — B952 Enterococcus as the cause of diseases classified elsewhere: Secondary | ICD-10-CM | POA: Diagnosis not present

## 2016-02-11 DIAGNOSIS — Z79899 Other long term (current) drug therapy: Secondary | ICD-10-CM | POA: Diagnosis not present

## 2016-02-11 DIAGNOSIS — R2689 Other abnormalities of gait and mobility: Secondary | ICD-10-CM | POA: Diagnosis not present

## 2016-02-11 DIAGNOSIS — J069 Acute upper respiratory infection, unspecified: Secondary | ICD-10-CM | POA: Diagnosis not present

## 2016-02-11 DIAGNOSIS — R41841 Cognitive communication deficit: Secondary | ICD-10-CM | POA: Diagnosis not present

## 2016-02-11 DIAGNOSIS — N390 Urinary tract infection, site not specified: Secondary | ICD-10-CM | POA: Diagnosis not present

## 2016-02-11 DIAGNOSIS — F0281 Dementia in other diseases classified elsewhere with behavioral disturbance: Secondary | ICD-10-CM | POA: Diagnosis not present

## 2016-02-11 DIAGNOSIS — I350 Nonrheumatic aortic (valve) stenosis: Secondary | ICD-10-CM | POA: Diagnosis not present

## 2016-02-11 DIAGNOSIS — I4891 Unspecified atrial fibrillation: Secondary | ICD-10-CM | POA: Diagnosis not present

## 2016-02-11 DIAGNOSIS — R269 Unspecified abnormalities of gait and mobility: Secondary | ICD-10-CM | POA: Diagnosis not present

## 2016-02-11 DIAGNOSIS — Z880 Allergy status to penicillin: Secondary | ICD-10-CM | POA: Diagnosis not present

## 2016-02-11 DIAGNOSIS — Z7982 Long term (current) use of aspirin: Secondary | ICD-10-CM | POA: Diagnosis not present

## 2016-02-11 DIAGNOSIS — R262 Difficulty in walking, not elsewhere classified: Secondary | ICD-10-CM | POA: Diagnosis not present

## 2016-02-11 DIAGNOSIS — J1000 Influenza due to other identified influenza virus with unspecified type of pneumonia: Secondary | ICD-10-CM | POA: Diagnosis not present

## 2016-02-11 DIAGNOSIS — R4789 Other speech disturbances: Secondary | ICD-10-CM | POA: Diagnosis not present

## 2016-02-11 DIAGNOSIS — M4802 Spinal stenosis, cervical region: Secondary | ICD-10-CM | POA: Diagnosis not present

## 2016-02-11 DIAGNOSIS — L719 Rosacea, unspecified: Secondary | ICD-10-CM | POA: Diagnosis not present

## 2016-02-17 DIAGNOSIS — G44309 Post-traumatic headache, unspecified, not intractable: Secondary | ICD-10-CM | POA: Diagnosis not present

## 2016-02-17 DIAGNOSIS — R41841 Cognitive communication deficit: Secondary | ICD-10-CM | POA: Diagnosis not present

## 2016-02-17 DIAGNOSIS — B964 Proteus (mirabilis) (morganii) as the cause of diseases classified elsewhere: Secondary | ICD-10-CM | POA: Diagnosis not present

## 2016-02-17 DIAGNOSIS — C4491 Basal cell carcinoma of skin, unspecified: Secondary | ICD-10-CM | POA: Diagnosis not present

## 2016-02-17 DIAGNOSIS — R001 Bradycardia, unspecified: Secondary | ICD-10-CM | POA: Diagnosis not present

## 2016-02-17 DIAGNOSIS — L719 Rosacea, unspecified: Secondary | ICD-10-CM | POA: Diagnosis not present

## 2016-02-17 DIAGNOSIS — R531 Weakness: Secondary | ICD-10-CM | POA: Diagnosis not present

## 2016-02-17 DIAGNOSIS — B952 Enterococcus as the cause of diseases classified elsewhere: Secondary | ICD-10-CM | POA: Diagnosis not present

## 2016-02-17 DIAGNOSIS — J101 Influenza due to other identified influenza virus with other respiratory manifestations: Secondary | ICD-10-CM | POA: Diagnosis not present

## 2016-02-17 DIAGNOSIS — R918 Other nonspecific abnormal finding of lung field: Secondary | ICD-10-CM | POA: Diagnosis not present

## 2016-02-17 DIAGNOSIS — F419 Anxiety disorder, unspecified: Secondary | ICD-10-CM | POA: Diagnosis not present

## 2016-02-17 DIAGNOSIS — I4891 Unspecified atrial fibrillation: Secondary | ICD-10-CM | POA: Diagnosis not present

## 2016-02-17 DIAGNOSIS — R4789 Other speech disturbances: Secondary | ICD-10-CM | POA: Diagnosis not present

## 2016-02-17 DIAGNOSIS — I5030 Unspecified diastolic (congestive) heart failure: Secondary | ICD-10-CM | POA: Diagnosis not present

## 2016-02-17 DIAGNOSIS — D62 Acute posthemorrhagic anemia: Secondary | ICD-10-CM | POA: Diagnosis not present

## 2016-02-17 DIAGNOSIS — M6281 Muscle weakness (generalized): Secondary | ICD-10-CM | POA: Diagnosis not present

## 2016-02-17 DIAGNOSIS — B373 Candidiasis of vulva and vagina: Secondary | ICD-10-CM | POA: Diagnosis not present

## 2016-02-17 DIAGNOSIS — N390 Urinary tract infection, site not specified: Secondary | ICD-10-CM | POA: Diagnosis not present

## 2016-02-17 DIAGNOSIS — M1280 Other specific arthropathies, not elsewhere classified, unspecified site: Secondary | ICD-10-CM | POA: Diagnosis not present

## 2016-02-17 DIAGNOSIS — I350 Nonrheumatic aortic (valve) stenosis: Secondary | ICD-10-CM | POA: Diagnosis not present

## 2016-02-17 DIAGNOSIS — F339 Major depressive disorder, recurrent, unspecified: Secondary | ICD-10-CM | POA: Diagnosis not present

## 2016-02-17 DIAGNOSIS — I509 Heart failure, unspecified: Secondary | ICD-10-CM | POA: Diagnosis not present

## 2016-02-17 DIAGNOSIS — I482 Chronic atrial fibrillation: Secondary | ICD-10-CM | POA: Diagnosis not present

## 2016-02-17 DIAGNOSIS — R296 Repeated falls: Secondary | ICD-10-CM | POA: Diagnosis not present

## 2016-02-17 DIAGNOSIS — H50812 Duane's syndrome, left eye: Secondary | ICD-10-CM | POA: Diagnosis not present

## 2016-02-17 DIAGNOSIS — J09X1 Influenza due to identified novel influenza A virus with pneumonia: Secondary | ICD-10-CM | POA: Diagnosis not present

## 2016-02-17 DIAGNOSIS — R2689 Other abnormalities of gait and mobility: Secondary | ICD-10-CM | POA: Diagnosis not present

## 2016-02-17 DIAGNOSIS — F0281 Dementia in other diseases classified elsewhere with behavioral disturbance: Secondary | ICD-10-CM | POA: Diagnosis not present

## 2016-02-17 DIAGNOSIS — J1000 Influenza due to other identified influenza virus with unspecified type of pneumonia: Secondary | ICD-10-CM | POA: Diagnosis not present

## 2016-02-17 DIAGNOSIS — F0151 Vascular dementia with behavioral disturbance: Secondary | ICD-10-CM | POA: Diagnosis not present

## 2016-02-17 DIAGNOSIS — M4802 Spinal stenosis, cervical region: Secondary | ICD-10-CM | POA: Diagnosis not present

## 2016-02-17 DIAGNOSIS — R489 Unspecified symbolic dysfunctions: Secondary | ICD-10-CM | POA: Diagnosis not present

## 2016-02-17 DIAGNOSIS — J189 Pneumonia, unspecified organism: Secondary | ICD-10-CM | POA: Diagnosis not present

## 2016-02-17 DIAGNOSIS — J069 Acute upper respiratory infection, unspecified: Secondary | ICD-10-CM | POA: Diagnosis not present

## 2016-02-17 DIAGNOSIS — F329 Major depressive disorder, single episode, unspecified: Secondary | ICD-10-CM | POA: Diagnosis not present

## 2016-02-17 DIAGNOSIS — S098XXA Other specified injuries of head, initial encounter: Secondary | ICD-10-CM | POA: Diagnosis not present

## 2016-02-17 DIAGNOSIS — F0391 Unspecified dementia with behavioral disturbance: Secondary | ICD-10-CM | POA: Diagnosis not present

## 2016-02-17 DIAGNOSIS — R262 Difficulty in walking, not elsewhere classified: Secondary | ICD-10-CM | POA: Diagnosis not present

## 2016-02-17 DIAGNOSIS — R269 Unspecified abnormalities of gait and mobility: Secondary | ICD-10-CM | POA: Diagnosis not present

## 2016-02-17 DIAGNOSIS — I5032 Chronic diastolic (congestive) heart failure: Secondary | ICD-10-CM | POA: Diagnosis not present

## 2016-02-17 DIAGNOSIS — I10 Essential (primary) hypertension: Secondary | ICD-10-CM | POA: Diagnosis not present

## 2016-02-18 DIAGNOSIS — I5032 Chronic diastolic (congestive) heart failure: Secondary | ICD-10-CM | POA: Diagnosis not present

## 2016-02-18 DIAGNOSIS — F329 Major depressive disorder, single episode, unspecified: Secondary | ICD-10-CM | POA: Diagnosis not present

## 2016-02-18 DIAGNOSIS — F419 Anxiety disorder, unspecified: Secondary | ICD-10-CM | POA: Diagnosis not present

## 2016-02-18 DIAGNOSIS — I10 Essential (primary) hypertension: Secondary | ICD-10-CM | POA: Diagnosis not present

## 2016-02-18 DIAGNOSIS — I4891 Unspecified atrial fibrillation: Secondary | ICD-10-CM | POA: Diagnosis not present

## 2016-02-18 DIAGNOSIS — S098XXA Other specified injuries of head, initial encounter: Secondary | ICD-10-CM | POA: Diagnosis not present

## 2016-02-18 DIAGNOSIS — F0391 Unspecified dementia with behavioral disturbance: Secondary | ICD-10-CM | POA: Diagnosis not present

## 2016-02-20 DIAGNOSIS — I10 Essential (primary) hypertension: Secondary | ICD-10-CM | POA: Diagnosis not present

## 2016-02-20 DIAGNOSIS — I5032 Chronic diastolic (congestive) heart failure: Secondary | ICD-10-CM | POA: Diagnosis not present

## 2016-02-20 DIAGNOSIS — J09X1 Influenza due to identified novel influenza A virus with pneumonia: Secondary | ICD-10-CM | POA: Diagnosis not present

## 2016-02-20 DIAGNOSIS — F419 Anxiety disorder, unspecified: Secondary | ICD-10-CM | POA: Diagnosis not present

## 2016-02-20 DIAGNOSIS — F0391 Unspecified dementia with behavioral disturbance: Secondary | ICD-10-CM | POA: Diagnosis not present

## 2016-02-20 DIAGNOSIS — F329 Major depressive disorder, single episode, unspecified: Secondary | ICD-10-CM | POA: Diagnosis not present

## 2016-02-20 DIAGNOSIS — S098XXA Other specified injuries of head, initial encounter: Secondary | ICD-10-CM | POA: Diagnosis not present

## 2016-02-20 DIAGNOSIS — I4891 Unspecified atrial fibrillation: Secondary | ICD-10-CM | POA: Diagnosis not present

## 2016-02-27 DIAGNOSIS — H50812 Duane's syndrome, left eye: Secondary | ICD-10-CM | POA: Diagnosis not present

## 2016-02-27 DIAGNOSIS — N390 Urinary tract infection, site not specified: Secondary | ICD-10-CM | POA: Diagnosis not present

## 2016-03-01 DIAGNOSIS — F0391 Unspecified dementia with behavioral disturbance: Secondary | ICD-10-CM | POA: Diagnosis not present

## 2016-03-01 DIAGNOSIS — F329 Major depressive disorder, single episode, unspecified: Secondary | ICD-10-CM | POA: Diagnosis not present

## 2016-03-01 DIAGNOSIS — S098XXA Other specified injuries of head, initial encounter: Secondary | ICD-10-CM | POA: Diagnosis not present

## 2016-03-01 DIAGNOSIS — F419 Anxiety disorder, unspecified: Secondary | ICD-10-CM | POA: Diagnosis not present

## 2016-03-01 DIAGNOSIS — I4891 Unspecified atrial fibrillation: Secondary | ICD-10-CM | POA: Diagnosis not present

## 2016-03-01 DIAGNOSIS — I10 Essential (primary) hypertension: Secondary | ICD-10-CM | POA: Diagnosis not present

## 2016-03-01 DIAGNOSIS — I5032 Chronic diastolic (congestive) heart failure: Secondary | ICD-10-CM | POA: Diagnosis not present

## 2016-03-02 DIAGNOSIS — M6281 Muscle weakness (generalized): Secondary | ICD-10-CM | POA: Diagnosis not present

## 2016-03-09 DIAGNOSIS — F0151 Vascular dementia with behavioral disturbance: Secondary | ICD-10-CM | POA: Diagnosis not present

## 2016-03-09 DIAGNOSIS — F329 Major depressive disorder, single episode, unspecified: Secondary | ICD-10-CM | POA: Diagnosis not present

## 2016-03-19 DIAGNOSIS — F0391 Unspecified dementia with behavioral disturbance: Secondary | ICD-10-CM | POA: Diagnosis not present

## 2016-03-19 DIAGNOSIS — F419 Anxiety disorder, unspecified: Secondary | ICD-10-CM | POA: Diagnosis not present

## 2016-03-19 DIAGNOSIS — I4891 Unspecified atrial fibrillation: Secondary | ICD-10-CM | POA: Diagnosis not present

## 2016-03-19 DIAGNOSIS — I10 Essential (primary) hypertension: Secondary | ICD-10-CM | POA: Diagnosis not present

## 2016-03-19 DIAGNOSIS — I5032 Chronic diastolic (congestive) heart failure: Secondary | ICD-10-CM | POA: Diagnosis not present

## 2016-03-19 DIAGNOSIS — S098XXA Other specified injuries of head, initial encounter: Secondary | ICD-10-CM | POA: Diagnosis not present

## 2016-03-19 DIAGNOSIS — F329 Major depressive disorder, single episode, unspecified: Secondary | ICD-10-CM | POA: Diagnosis not present

## 2016-04-21 DIAGNOSIS — F0151 Vascular dementia with behavioral disturbance: Secondary | ICD-10-CM | POA: Diagnosis not present

## 2016-04-21 DIAGNOSIS — F329 Major depressive disorder, single episode, unspecified: Secondary | ICD-10-CM | POA: Diagnosis not present

## 2016-04-26 DIAGNOSIS — I10 Essential (primary) hypertension: Secondary | ICD-10-CM | POA: Diagnosis not present

## 2016-04-26 DIAGNOSIS — I4891 Unspecified atrial fibrillation: Secondary | ICD-10-CM | POA: Diagnosis not present

## 2016-04-26 DIAGNOSIS — F419 Anxiety disorder, unspecified: Secondary | ICD-10-CM | POA: Diagnosis not present

## 2016-04-26 DIAGNOSIS — I5032 Chronic diastolic (congestive) heart failure: Secondary | ICD-10-CM | POA: Diagnosis not present

## 2016-04-26 DIAGNOSIS — S098XXA Other specified injuries of head, initial encounter: Secondary | ICD-10-CM | POA: Diagnosis not present

## 2016-04-26 DIAGNOSIS — F329 Major depressive disorder, single episode, unspecified: Secondary | ICD-10-CM | POA: Diagnosis not present

## 2016-04-26 DIAGNOSIS — F0391 Unspecified dementia with behavioral disturbance: Secondary | ICD-10-CM | POA: Diagnosis not present

## 2016-04-28 DIAGNOSIS — M6281 Muscle weakness (generalized): Secondary | ICD-10-CM | POA: Diagnosis not present

## 2016-05-03 DIAGNOSIS — Z79899 Other long term (current) drug therapy: Secondary | ICD-10-CM | POA: Diagnosis not present

## 2016-05-04 DIAGNOSIS — E876 Hypokalemia: Secondary | ICD-10-CM | POA: Diagnosis not present

## 2016-05-10 DIAGNOSIS — Z79899 Other long term (current) drug therapy: Secondary | ICD-10-CM | POA: Diagnosis not present

## 2016-05-13 DIAGNOSIS — I10 Essential (primary) hypertension: Secondary | ICD-10-CM | POA: Diagnosis not present

## 2016-05-13 DIAGNOSIS — I5032 Chronic diastolic (congestive) heart failure: Secondary | ICD-10-CM | POA: Diagnosis not present

## 2016-05-13 DIAGNOSIS — F329 Major depressive disorder, single episode, unspecified: Secondary | ICD-10-CM | POA: Diagnosis not present

## 2016-05-13 DIAGNOSIS — F419 Anxiety disorder, unspecified: Secondary | ICD-10-CM | POA: Diagnosis not present

## 2016-05-13 DIAGNOSIS — F0391 Unspecified dementia with behavioral disturbance: Secondary | ICD-10-CM | POA: Diagnosis not present

## 2016-05-13 DIAGNOSIS — I4891 Unspecified atrial fibrillation: Secondary | ICD-10-CM | POA: Diagnosis not present

## 2016-05-13 DIAGNOSIS — S098XXA Other specified injuries of head, initial encounter: Secondary | ICD-10-CM | POA: Diagnosis not present

## 2016-05-31 DIAGNOSIS — I10 Essential (primary) hypertension: Secondary | ICD-10-CM | POA: Diagnosis not present

## 2016-05-31 DIAGNOSIS — F419 Anxiety disorder, unspecified: Secondary | ICD-10-CM | POA: Diagnosis not present

## 2016-05-31 DIAGNOSIS — F329 Major depressive disorder, single episode, unspecified: Secondary | ICD-10-CM | POA: Diagnosis not present

## 2016-05-31 DIAGNOSIS — I4891 Unspecified atrial fibrillation: Secondary | ICD-10-CM | POA: Diagnosis not present

## 2016-05-31 DIAGNOSIS — I5032 Chronic diastolic (congestive) heart failure: Secondary | ICD-10-CM | POA: Diagnosis not present

## 2016-05-31 DIAGNOSIS — S098XXA Other specified injuries of head, initial encounter: Secondary | ICD-10-CM | POA: Diagnosis not present

## 2016-05-31 DIAGNOSIS — F0391 Unspecified dementia with behavioral disturbance: Secondary | ICD-10-CM | POA: Diagnosis not present

## 2016-06-04 DIAGNOSIS — F0391 Unspecified dementia with behavioral disturbance: Secondary | ICD-10-CM | POA: Diagnosis not present

## 2016-06-04 DIAGNOSIS — S098XXA Other specified injuries of head, initial encounter: Secondary | ICD-10-CM | POA: Diagnosis not present

## 2016-06-04 DIAGNOSIS — I5032 Chronic diastolic (congestive) heart failure: Secondary | ICD-10-CM | POA: Diagnosis not present

## 2016-06-04 DIAGNOSIS — I10 Essential (primary) hypertension: Secondary | ICD-10-CM | POA: Diagnosis not present

## 2016-06-04 DIAGNOSIS — I4891 Unspecified atrial fibrillation: Secondary | ICD-10-CM | POA: Diagnosis not present

## 2016-06-04 DIAGNOSIS — F329 Major depressive disorder, single episode, unspecified: Secondary | ICD-10-CM | POA: Diagnosis not present

## 2016-06-04 DIAGNOSIS — F419 Anxiety disorder, unspecified: Secondary | ICD-10-CM | POA: Diagnosis not present

## 2016-06-09 DIAGNOSIS — F0151 Vascular dementia with behavioral disturbance: Secondary | ICD-10-CM | POA: Diagnosis not present

## 2016-06-09 DIAGNOSIS — F329 Major depressive disorder, single episode, unspecified: Secondary | ICD-10-CM | POA: Diagnosis not present

## 2016-06-17 IMAGING — CT CT CERVICAL SPINE W/O CM
2 of 3 series · 8 of 14 positions shown, 10 images · non-contrast
Comparison: 06/24/2014

CLINICAL DATA: Fall with left forehead injury. Patient complains of
neck pain. History of cervical spine stenosis.

EXAM:
CT HEAD WITHOUT CONTRAST
CT CERVICAL SPINE WITHOUT CONTRAST
TECHNIQUE: Multidetector CT imaging of the head and cervical spine was
performed following the standard protocol without intravenous
contrast. Multiplanar CT image reconstructions of the cervical spine
were also generated.

[Series 5: c spine soft · axial · 0.29mm/px · z∈[+278,+350]mm · 3 of 72 slices shown]
[im 18/72  soft-tissue]
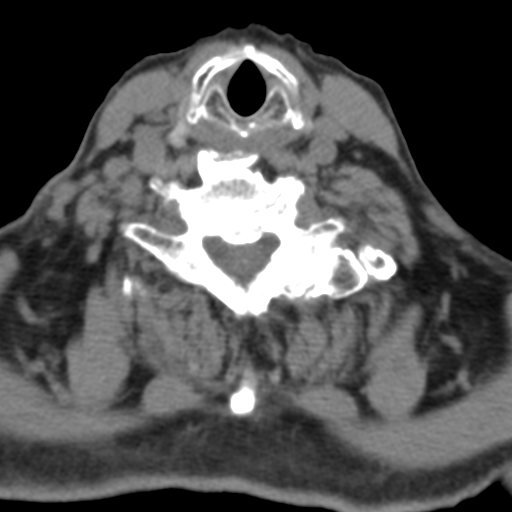
[im 36/72  soft-tissue]
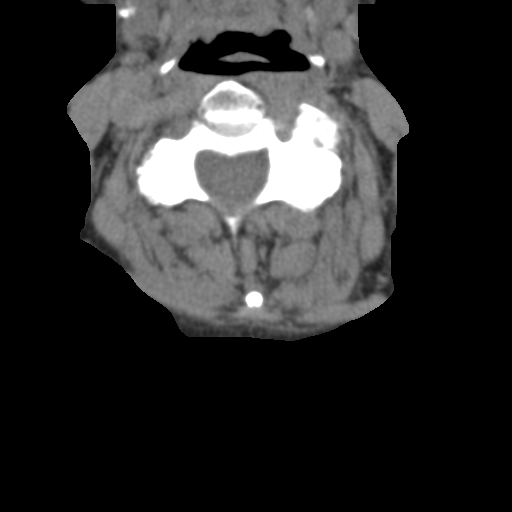
[im 54/72  soft-tissue]
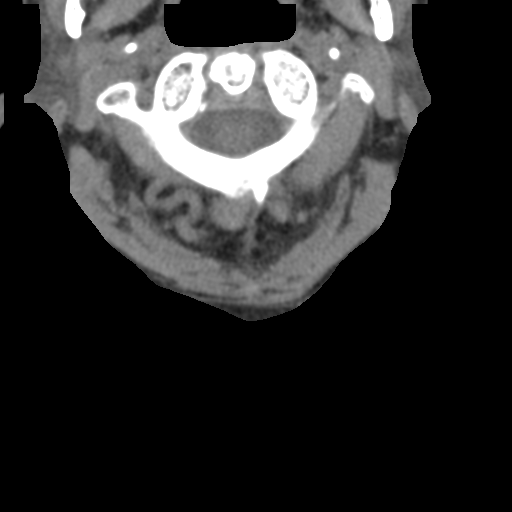

[Series 10: orthogonal axials · axial · 0.22mm/px · z∈[+226,+335]mm · 5 of 98 slices shown, 7 images]
[im 17/98  soft-tissue]
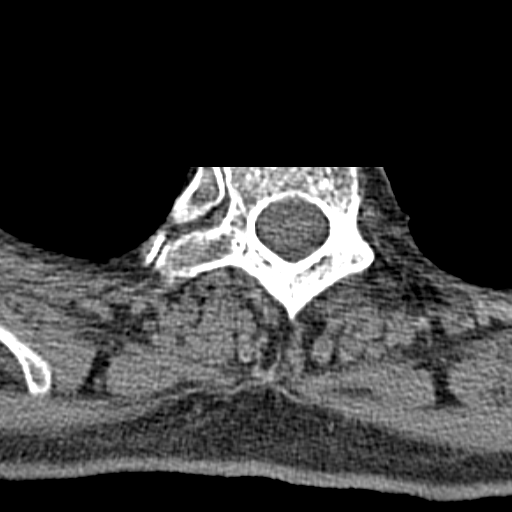
[im 17/98  bone]
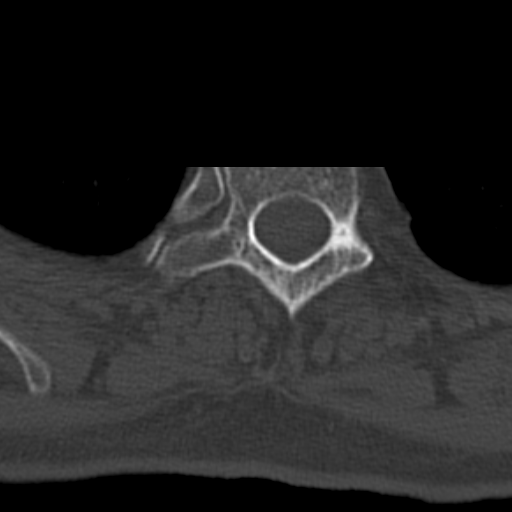
[im 33/98  bone]
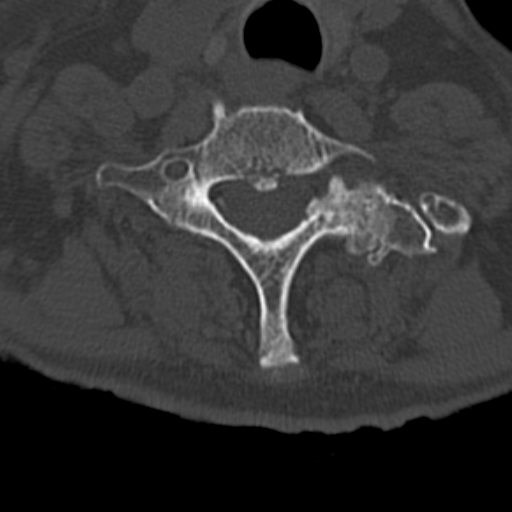
[im 49/98  bone]
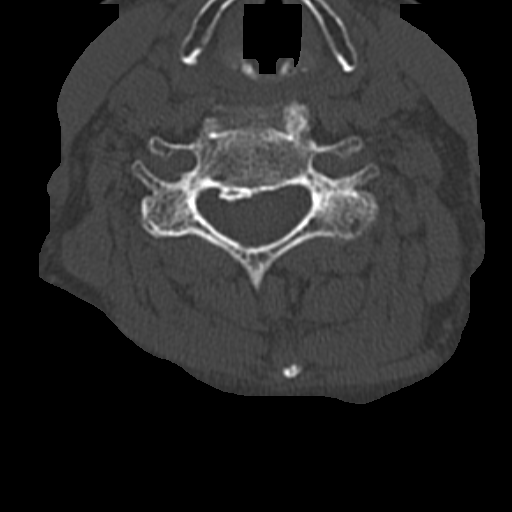
[im 65/98  bone]
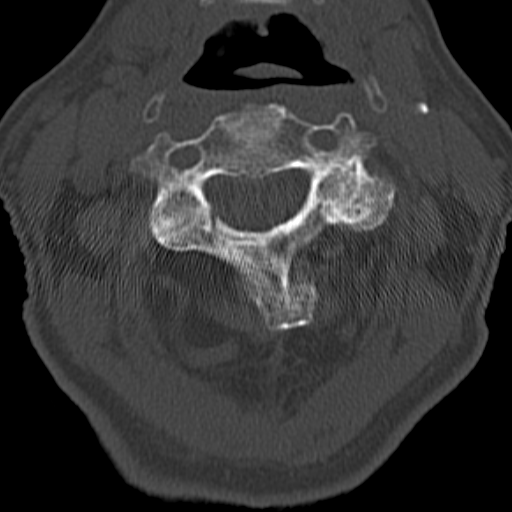
[im 81/98  soft-tissue]
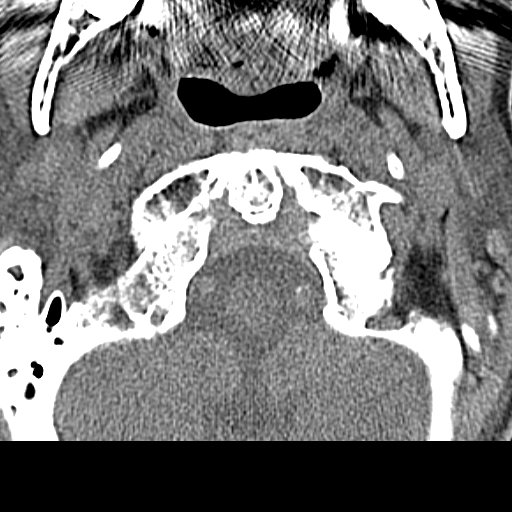
[im 81/98  bone]
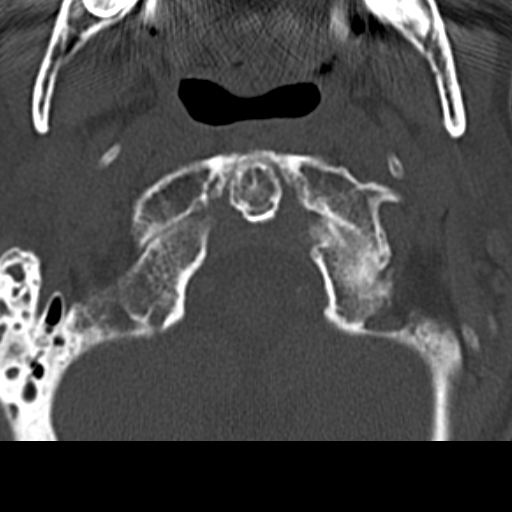

[8 of 14 positions shown; findings below may reference images not displayed]

FINDINGS: CT HEAD FINDINGS

Mild cerebral atrophy is similar to the previous examination. Again
noted is low-density in the periventricular and subcortical white
matter suggesting chronic changes. No evidence for acute hemorrhage,
mass lesion, midline shift, hydrocephalus or large new infarct.
There is a scalp hematoma along the left forehead. No evidence for a
calvarial fracture. No significant paranasal sinus disease. There is
a small amount of fluid in the right mastoid air cells.

CT CERVICAL SPINE FINDINGS

Small amount of fluid in the right mastoid air cells. Multilevel
degenerative disease. Multilevel degenerative facet arthropathy,
particularly on the left side. There appears to be severe left
foraminal narrowing at C3-C4. Large broad-based disc osteophyte
complex at C5-C6. Bilateral foraminal and central narrowing at
C5-C6. Broad-based disc osteophyte complex at C6-C7 with central
narrowing and bilateral foraminal narrowing, right side greater the
left. No evidence for an acute fracture or dislocation. Lung apices
are clear without pneumothorax. No significant soft tissue swelling
in the neck. There may be an exophytic nodule along the inferior
aspect of the isthmus measuring up to 0.9 cm and similar to the
previous examination. Alignment of cervical spine is unchanged with
minimal kyphosis at C5-C6.
IMPRESSION: No acute intracranial abnormality.

Diffuse white matter disease suggests chronic small vessel ischemic
changes.

Left forehead scalp hematoma without a calvarial fracture.

No acute bone abnormality in the cervical spine.

Multilevel degenerative changes in the cervical spine with areas of
central and foraminal stenosis.

## 2016-06-21 DIAGNOSIS — I739 Peripheral vascular disease, unspecified: Secondary | ICD-10-CM | POA: Diagnosis not present

## 2016-06-21 DIAGNOSIS — B351 Tinea unguium: Secondary | ICD-10-CM | POA: Diagnosis not present

## 2016-06-21 DIAGNOSIS — L603 Nail dystrophy: Secondary | ICD-10-CM | POA: Diagnosis not present

## 2016-06-21 DIAGNOSIS — Q845 Enlarged and hypertrophic nails: Secondary | ICD-10-CM | POA: Diagnosis not present

## 2016-06-30 DIAGNOSIS — N390 Urinary tract infection, site not specified: Secondary | ICD-10-CM | POA: Diagnosis not present

## 2016-06-30 DIAGNOSIS — F329 Major depressive disorder, single episode, unspecified: Secondary | ICD-10-CM | POA: Diagnosis not present

## 2016-06-30 DIAGNOSIS — F419 Anxiety disorder, unspecified: Secondary | ICD-10-CM | POA: Diagnosis not present

## 2016-06-30 DIAGNOSIS — I5032 Chronic diastolic (congestive) heart failure: Secondary | ICD-10-CM | POA: Diagnosis not present

## 2016-06-30 DIAGNOSIS — I10 Essential (primary) hypertension: Secondary | ICD-10-CM | POA: Diagnosis not present

## 2016-06-30 DIAGNOSIS — F0391 Unspecified dementia with behavioral disturbance: Secondary | ICD-10-CM | POA: Diagnosis not present

## 2016-06-30 DIAGNOSIS — S098XXA Other specified injuries of head, initial encounter: Secondary | ICD-10-CM | POA: Diagnosis not present

## 2016-06-30 DIAGNOSIS — I4891 Unspecified atrial fibrillation: Secondary | ICD-10-CM | POA: Diagnosis not present

## 2016-07-01 DIAGNOSIS — F0151 Vascular dementia with behavioral disturbance: Secondary | ICD-10-CM | POA: Diagnosis not present

## 2016-07-01 DIAGNOSIS — F329 Major depressive disorder, single episode, unspecified: Secondary | ICD-10-CM | POA: Diagnosis not present

## 2016-07-09 DIAGNOSIS — J1000 Influenza due to other identified influenza virus with unspecified type of pneumonia: Secondary | ICD-10-CM | POA: Diagnosis not present

## 2016-07-09 DIAGNOSIS — Z7409 Other reduced mobility: Secondary | ICD-10-CM | POA: Diagnosis not present

## 2016-07-10 DIAGNOSIS — N390 Urinary tract infection, site not specified: Secondary | ICD-10-CM | POA: Diagnosis not present

## 2016-07-12 DIAGNOSIS — Z7409 Other reduced mobility: Secondary | ICD-10-CM | POA: Diagnosis not present

## 2016-07-12 DIAGNOSIS — J1000 Influenza due to other identified influenza virus with unspecified type of pneumonia: Secondary | ICD-10-CM | POA: Diagnosis not present

## 2016-07-13 DIAGNOSIS — J1000 Influenza due to other identified influenza virus with unspecified type of pneumonia: Secondary | ICD-10-CM | POA: Diagnosis not present

## 2016-07-13 DIAGNOSIS — Z7409 Other reduced mobility: Secondary | ICD-10-CM | POA: Diagnosis not present

## 2016-07-14 DIAGNOSIS — F0151 Vascular dementia with behavioral disturbance: Secondary | ICD-10-CM | POA: Diagnosis not present

## 2016-07-14 DIAGNOSIS — J1000 Influenza due to other identified influenza virus with unspecified type of pneumonia: Secondary | ICD-10-CM | POA: Diagnosis not present

## 2016-07-14 DIAGNOSIS — F329 Major depressive disorder, single episode, unspecified: Secondary | ICD-10-CM | POA: Diagnosis not present

## 2016-07-14 DIAGNOSIS — Z7409 Other reduced mobility: Secondary | ICD-10-CM | POA: Diagnosis not present

## 2016-07-15 DIAGNOSIS — Z7409 Other reduced mobility: Secondary | ICD-10-CM | POA: Diagnosis not present

## 2016-07-15 DIAGNOSIS — J1000 Influenza due to other identified influenza virus with unspecified type of pneumonia: Secondary | ICD-10-CM | POA: Diagnosis not present

## 2016-07-19 DIAGNOSIS — J1000 Influenza due to other identified influenza virus with unspecified type of pneumonia: Secondary | ICD-10-CM | POA: Diagnosis not present

## 2016-07-19 DIAGNOSIS — Z7409 Other reduced mobility: Secondary | ICD-10-CM | POA: Diagnosis not present

## 2016-07-19 DIAGNOSIS — K5900 Constipation, unspecified: Secondary | ICD-10-CM | POA: Diagnosis not present

## 2016-07-20 DIAGNOSIS — M6281 Muscle weakness (generalized): Secondary | ICD-10-CM | POA: Diagnosis not present

## 2016-07-20 DIAGNOSIS — J1000 Influenza due to other identified influenza virus with unspecified type of pneumonia: Secondary | ICD-10-CM | POA: Diagnosis not present

## 2016-07-20 DIAGNOSIS — Z7409 Other reduced mobility: Secondary | ICD-10-CM | POA: Diagnosis not present

## 2016-07-21 DIAGNOSIS — J1000 Influenza due to other identified influenza virus with unspecified type of pneumonia: Secondary | ICD-10-CM | POA: Diagnosis not present

## 2016-07-21 DIAGNOSIS — Z7409 Other reduced mobility: Secondary | ICD-10-CM | POA: Diagnosis not present

## 2016-07-22 DIAGNOSIS — J1000 Influenza due to other identified influenza virus with unspecified type of pneumonia: Secondary | ICD-10-CM | POA: Diagnosis not present

## 2016-07-22 DIAGNOSIS — Z7409 Other reduced mobility: Secondary | ICD-10-CM | POA: Diagnosis not present

## 2016-07-23 DIAGNOSIS — J1000 Influenza due to other identified influenza virus with unspecified type of pneumonia: Secondary | ICD-10-CM | POA: Diagnosis not present

## 2016-07-23 DIAGNOSIS — Z7409 Other reduced mobility: Secondary | ICD-10-CM | POA: Diagnosis not present

## 2016-07-26 DIAGNOSIS — Z7409 Other reduced mobility: Secondary | ICD-10-CM | POA: Diagnosis not present

## 2016-07-26 DIAGNOSIS — J1000 Influenza due to other identified influenza virus with unspecified type of pneumonia: Secondary | ICD-10-CM | POA: Diagnosis not present

## 2016-07-26 DIAGNOSIS — E119 Type 2 diabetes mellitus without complications: Secondary | ICD-10-CM | POA: Diagnosis not present

## 2016-07-27 DIAGNOSIS — J1000 Influenza due to other identified influenza virus with unspecified type of pneumonia: Secondary | ICD-10-CM | POA: Diagnosis not present

## 2016-07-27 DIAGNOSIS — Z7409 Other reduced mobility: Secondary | ICD-10-CM | POA: Diagnosis not present

## 2016-07-28 DIAGNOSIS — J1000 Influenza due to other identified influenza virus with unspecified type of pneumonia: Secondary | ICD-10-CM | POA: Diagnosis not present

## 2016-07-28 DIAGNOSIS — Z7409 Other reduced mobility: Secondary | ICD-10-CM | POA: Diagnosis not present

## 2016-07-29 DIAGNOSIS — Z7409 Other reduced mobility: Secondary | ICD-10-CM | POA: Diagnosis not present

## 2016-07-29 DIAGNOSIS — J1000 Influenza due to other identified influenza virus with unspecified type of pneumonia: Secondary | ICD-10-CM | POA: Diagnosis not present

## 2016-07-30 DIAGNOSIS — Z7409 Other reduced mobility: Secondary | ICD-10-CM | POA: Diagnosis not present

## 2016-07-30 DIAGNOSIS — J1000 Influenza due to other identified influenza virus with unspecified type of pneumonia: Secondary | ICD-10-CM | POA: Diagnosis not present

## 2016-08-04 DIAGNOSIS — Z7409 Other reduced mobility: Secondary | ICD-10-CM | POA: Diagnosis not present

## 2016-08-04 DIAGNOSIS — J1000 Influenza due to other identified influenza virus with unspecified type of pneumonia: Secondary | ICD-10-CM | POA: Diagnosis not present

## 2016-08-05 DIAGNOSIS — F419 Anxiety disorder, unspecified: Secondary | ICD-10-CM | POA: Diagnosis not present

## 2016-08-05 DIAGNOSIS — K5900 Constipation, unspecified: Secondary | ICD-10-CM | POA: Diagnosis not present

## 2016-08-05 DIAGNOSIS — I10 Essential (primary) hypertension: Secondary | ICD-10-CM | POA: Diagnosis not present

## 2016-08-05 DIAGNOSIS — Z7409 Other reduced mobility: Secondary | ICD-10-CM | POA: Diagnosis not present

## 2016-08-05 DIAGNOSIS — J1000 Influenza due to other identified influenza virus with unspecified type of pneumonia: Secondary | ICD-10-CM | POA: Diagnosis not present

## 2016-08-05 DIAGNOSIS — F0391 Unspecified dementia with behavioral disturbance: Secondary | ICD-10-CM | POA: Diagnosis not present

## 2016-08-05 DIAGNOSIS — M1710 Unilateral primary osteoarthritis, unspecified knee: Secondary | ICD-10-CM | POA: Diagnosis not present

## 2016-08-05 DIAGNOSIS — F329 Major depressive disorder, single episode, unspecified: Secondary | ICD-10-CM | POA: Diagnosis not present

## 2016-08-05 DIAGNOSIS — I5032 Chronic diastolic (congestive) heart failure: Secondary | ICD-10-CM | POA: Diagnosis not present

## 2016-08-05 DIAGNOSIS — I4891 Unspecified atrial fibrillation: Secondary | ICD-10-CM | POA: Diagnosis not present

## 2016-08-06 DIAGNOSIS — Z7409 Other reduced mobility: Secondary | ICD-10-CM | POA: Diagnosis not present

## 2016-08-06 DIAGNOSIS — J1000 Influenza due to other identified influenza virus with unspecified type of pneumonia: Secondary | ICD-10-CM | POA: Diagnosis not present

## 2016-08-07 DIAGNOSIS — J1000 Influenza due to other identified influenza virus with unspecified type of pneumonia: Secondary | ICD-10-CM | POA: Diagnosis not present

## 2016-08-07 DIAGNOSIS — Z7409 Other reduced mobility: Secondary | ICD-10-CM | POA: Diagnosis not present

## 2016-08-09 DIAGNOSIS — E039 Hypothyroidism, unspecified: Secondary | ICD-10-CM | POA: Diagnosis not present

## 2016-08-09 DIAGNOSIS — Z79899 Other long term (current) drug therapy: Secondary | ICD-10-CM | POA: Diagnosis not present

## 2016-08-09 DIAGNOSIS — R569 Unspecified convulsions: Secondary | ICD-10-CM | POA: Diagnosis not present

## 2016-08-09 DIAGNOSIS — E559 Vitamin D deficiency, unspecified: Secondary | ICD-10-CM | POA: Diagnosis not present

## 2016-08-09 DIAGNOSIS — Z7409 Other reduced mobility: Secondary | ICD-10-CM | POA: Diagnosis not present

## 2016-08-09 DIAGNOSIS — J1000 Influenza due to other identified influenza virus with unspecified type of pneumonia: Secondary | ICD-10-CM | POA: Diagnosis not present

## 2016-08-10 DIAGNOSIS — F0391 Unspecified dementia with behavioral disturbance: Secondary | ICD-10-CM | POA: Diagnosis not present

## 2016-08-10 DIAGNOSIS — I10 Essential (primary) hypertension: Secondary | ICD-10-CM | POA: Diagnosis not present

## 2016-08-10 DIAGNOSIS — K5900 Constipation, unspecified: Secondary | ICD-10-CM | POA: Diagnosis not present

## 2016-08-10 DIAGNOSIS — I4891 Unspecified atrial fibrillation: Secondary | ICD-10-CM | POA: Diagnosis not present

## 2016-08-10 DIAGNOSIS — F329 Major depressive disorder, single episode, unspecified: Secondary | ICD-10-CM | POA: Diagnosis not present

## 2016-08-10 DIAGNOSIS — M1710 Unilateral primary osteoarthritis, unspecified knee: Secondary | ICD-10-CM | POA: Diagnosis not present

## 2016-08-10 DIAGNOSIS — F419 Anxiety disorder, unspecified: Secondary | ICD-10-CM | POA: Diagnosis not present

## 2016-08-10 DIAGNOSIS — I5032 Chronic diastolic (congestive) heart failure: Secondary | ICD-10-CM | POA: Diagnosis not present

## 2016-08-10 DIAGNOSIS — N390 Urinary tract infection, site not specified: Secondary | ICD-10-CM | POA: Diagnosis not present

## 2016-08-11 DIAGNOSIS — Z7409 Other reduced mobility: Secondary | ICD-10-CM | POA: Diagnosis not present

## 2016-08-11 DIAGNOSIS — J1000 Influenza due to other identified influenza virus with unspecified type of pneumonia: Secondary | ICD-10-CM | POA: Diagnosis not present

## 2016-08-12 DIAGNOSIS — E039 Hypothyroidism, unspecified: Secondary | ICD-10-CM | POA: Diagnosis not present

## 2016-08-12 DIAGNOSIS — J9601 Acute respiratory failure with hypoxia: Secondary | ICD-10-CM | POA: Diagnosis not present

## 2016-08-12 DIAGNOSIS — J1000 Influenza due to other identified influenza virus with unspecified type of pneumonia: Secondary | ICD-10-CM | POA: Diagnosis not present

## 2016-08-12 DIAGNOSIS — R0902 Hypoxemia: Secondary | ICD-10-CM | POA: Diagnosis not present

## 2016-08-12 DIAGNOSIS — I482 Chronic atrial fibrillation: Secondary | ICD-10-CM | POA: Diagnosis not present

## 2016-08-12 DIAGNOSIS — R001 Bradycardia, unspecified: Secondary | ICD-10-CM | POA: Diagnosis not present

## 2016-08-12 DIAGNOSIS — Z7409 Other reduced mobility: Secondary | ICD-10-CM | POA: Diagnosis not present

## 2016-08-12 DIAGNOSIS — N329 Bladder disorder, unspecified: Secondary | ICD-10-CM | POA: Diagnosis not present

## 2016-08-12 DIAGNOSIS — I5032 Chronic diastolic (congestive) heart failure: Secondary | ICD-10-CM | POA: Diagnosis not present

## 2016-08-13 DIAGNOSIS — J1000 Influenza due to other identified influenza virus with unspecified type of pneumonia: Secondary | ICD-10-CM | POA: Diagnosis not present

## 2016-08-13 DIAGNOSIS — Z7409 Other reduced mobility: Secondary | ICD-10-CM | POA: Diagnosis not present

## 2016-08-14 DIAGNOSIS — J1000 Influenza due to other identified influenza virus with unspecified type of pneumonia: Secondary | ICD-10-CM | POA: Diagnosis not present

## 2016-08-14 DIAGNOSIS — Z7409 Other reduced mobility: Secondary | ICD-10-CM | POA: Diagnosis not present

## 2016-08-16 DIAGNOSIS — Z7409 Other reduced mobility: Secondary | ICD-10-CM | POA: Diagnosis not present

## 2016-08-16 DIAGNOSIS — Z79899 Other long term (current) drug therapy: Secondary | ICD-10-CM | POA: Diagnosis not present

## 2016-08-16 DIAGNOSIS — J1000 Influenza due to other identified influenza virus with unspecified type of pneumonia: Secondary | ICD-10-CM | POA: Diagnosis not present

## 2016-08-17 DIAGNOSIS — N390 Urinary tract infection, site not specified: Secondary | ICD-10-CM | POA: Diagnosis not present

## 2016-08-17 DIAGNOSIS — Z7409 Other reduced mobility: Secondary | ICD-10-CM | POA: Diagnosis not present

## 2016-08-17 DIAGNOSIS — J1000 Influenza due to other identified influenza virus with unspecified type of pneumonia: Secondary | ICD-10-CM | POA: Diagnosis not present

## 2016-08-18 DIAGNOSIS — J1000 Influenza due to other identified influenza virus with unspecified type of pneumonia: Secondary | ICD-10-CM | POA: Diagnosis not present

## 2016-08-18 DIAGNOSIS — F329 Major depressive disorder, single episode, unspecified: Secondary | ICD-10-CM | POA: Diagnosis not present

## 2016-08-18 DIAGNOSIS — Z7409 Other reduced mobility: Secondary | ICD-10-CM | POA: Diagnosis not present

## 2016-08-18 DIAGNOSIS — F0151 Vascular dementia with behavioral disturbance: Secondary | ICD-10-CM | POA: Diagnosis not present

## 2016-08-19 DIAGNOSIS — Z7409 Other reduced mobility: Secondary | ICD-10-CM | POA: Diagnosis not present

## 2016-08-19 DIAGNOSIS — J1000 Influenza due to other identified influenza virus with unspecified type of pneumonia: Secondary | ICD-10-CM | POA: Diagnosis not present

## 2016-08-20 DIAGNOSIS — J1000 Influenza due to other identified influenza virus with unspecified type of pneumonia: Secondary | ICD-10-CM | POA: Diagnosis not present

## 2016-08-20 DIAGNOSIS — Z7409 Other reduced mobility: Secondary | ICD-10-CM | POA: Diagnosis not present

## 2016-08-23 DIAGNOSIS — Z7409 Other reduced mobility: Secondary | ICD-10-CM | POA: Diagnosis not present

## 2016-08-23 DIAGNOSIS — J1000 Influenza due to other identified influenza virus with unspecified type of pneumonia: Secondary | ICD-10-CM | POA: Diagnosis not present

## 2016-08-24 DIAGNOSIS — J1000 Influenza due to other identified influenza virus with unspecified type of pneumonia: Secondary | ICD-10-CM | POA: Diagnosis not present

## 2016-08-24 DIAGNOSIS — Z7409 Other reduced mobility: Secondary | ICD-10-CM | POA: Diagnosis not present

## 2016-08-25 DIAGNOSIS — J1000 Influenza due to other identified influenza virus with unspecified type of pneumonia: Secondary | ICD-10-CM | POA: Diagnosis not present

## 2016-08-25 DIAGNOSIS — Z7409 Other reduced mobility: Secondary | ICD-10-CM | POA: Diagnosis not present

## 2016-08-26 DIAGNOSIS — Z7409 Other reduced mobility: Secondary | ICD-10-CM | POA: Diagnosis not present

## 2016-08-26 DIAGNOSIS — J1000 Influenza due to other identified influenza virus with unspecified type of pneumonia: Secondary | ICD-10-CM | POA: Diagnosis not present

## 2016-08-27 DIAGNOSIS — Z7409 Other reduced mobility: Secondary | ICD-10-CM | POA: Diagnosis not present

## 2016-08-27 DIAGNOSIS — J1000 Influenza due to other identified influenza virus with unspecified type of pneumonia: Secondary | ICD-10-CM | POA: Diagnosis not present

## 2016-08-31 DIAGNOSIS — J1000 Influenza due to other identified influenza virus with unspecified type of pneumonia: Secondary | ICD-10-CM | POA: Diagnosis not present

## 2016-08-31 DIAGNOSIS — Z7409 Other reduced mobility: Secondary | ICD-10-CM | POA: Diagnosis not present

## 2016-09-01 DIAGNOSIS — Z7409 Other reduced mobility: Secondary | ICD-10-CM | POA: Diagnosis not present

## 2016-09-01 DIAGNOSIS — J1000 Influenza due to other identified influenza virus with unspecified type of pneumonia: Secondary | ICD-10-CM | POA: Diagnosis not present

## 2016-09-03 DIAGNOSIS — Z7409 Other reduced mobility: Secondary | ICD-10-CM | POA: Diagnosis not present

## 2016-09-03 DIAGNOSIS — J1000 Influenza due to other identified influenza virus with unspecified type of pneumonia: Secondary | ICD-10-CM | POA: Diagnosis not present

## 2016-09-04 DIAGNOSIS — Z7409 Other reduced mobility: Secondary | ICD-10-CM | POA: Diagnosis not present

## 2016-09-04 DIAGNOSIS — J1000 Influenza due to other identified influenza virus with unspecified type of pneumonia: Secondary | ICD-10-CM | POA: Diagnosis not present

## 2016-09-06 DIAGNOSIS — Z7409 Other reduced mobility: Secondary | ICD-10-CM | POA: Diagnosis not present

## 2016-09-06 DIAGNOSIS — J1000 Influenza due to other identified influenza virus with unspecified type of pneumonia: Secondary | ICD-10-CM | POA: Diagnosis not present

## 2016-09-07 DIAGNOSIS — Z7409 Other reduced mobility: Secondary | ICD-10-CM | POA: Diagnosis not present

## 2016-09-07 DIAGNOSIS — J1000 Influenza due to other identified influenza virus with unspecified type of pneumonia: Secondary | ICD-10-CM | POA: Diagnosis not present

## 2016-09-08 DIAGNOSIS — Z7409 Other reduced mobility: Secondary | ICD-10-CM | POA: Diagnosis not present

## 2016-09-08 DIAGNOSIS — M1710 Unilateral primary osteoarthritis, unspecified knee: Secondary | ICD-10-CM | POA: Diagnosis not present

## 2016-09-08 DIAGNOSIS — R54 Age-related physical debility: Secondary | ICD-10-CM | POA: Diagnosis not present

## 2016-09-08 DIAGNOSIS — E039 Hypothyroidism, unspecified: Secondary | ICD-10-CM | POA: Diagnosis not present

## 2016-09-08 DIAGNOSIS — I4891 Unspecified atrial fibrillation: Secondary | ICD-10-CM | POA: Diagnosis not present

## 2016-09-08 DIAGNOSIS — K5900 Constipation, unspecified: Secondary | ICD-10-CM | POA: Diagnosis not present

## 2016-09-08 DIAGNOSIS — F419 Anxiety disorder, unspecified: Secondary | ICD-10-CM | POA: Diagnosis not present

## 2016-09-08 DIAGNOSIS — J1000 Influenza due to other identified influenza virus with unspecified type of pneumonia: Secondary | ICD-10-CM | POA: Diagnosis not present

## 2016-09-08 DIAGNOSIS — F329 Major depressive disorder, single episode, unspecified: Secondary | ICD-10-CM | POA: Diagnosis not present

## 2016-09-08 DIAGNOSIS — I5032 Chronic diastolic (congestive) heart failure: Secondary | ICD-10-CM | POA: Diagnosis not present

## 2016-09-09 DIAGNOSIS — J1000 Influenza due to other identified influenza virus with unspecified type of pneumonia: Secondary | ICD-10-CM | POA: Diagnosis not present

## 2016-09-09 DIAGNOSIS — Z7409 Other reduced mobility: Secondary | ICD-10-CM | POA: Diagnosis not present

## 2016-09-13 DIAGNOSIS — J1000 Influenza due to other identified influenza virus with unspecified type of pneumonia: Secondary | ICD-10-CM | POA: Diagnosis not present

## 2016-09-13 DIAGNOSIS — Z7409 Other reduced mobility: Secondary | ICD-10-CM | POA: Diagnosis not present

## 2016-09-13 DIAGNOSIS — E039 Hypothyroidism, unspecified: Secondary | ICD-10-CM | POA: Diagnosis not present

## 2016-09-14 DIAGNOSIS — Z7409 Other reduced mobility: Secondary | ICD-10-CM | POA: Diagnosis not present

## 2016-09-14 DIAGNOSIS — J1000 Influenza due to other identified influenza virus with unspecified type of pneumonia: Secondary | ICD-10-CM | POA: Diagnosis not present

## 2016-09-15 DIAGNOSIS — M6281 Muscle weakness (generalized): Secondary | ICD-10-CM | POA: Diagnosis not present

## 2016-09-15 DIAGNOSIS — Z7409 Other reduced mobility: Secondary | ICD-10-CM | POA: Diagnosis not present

## 2016-09-15 DIAGNOSIS — J1000 Influenza due to other identified influenza virus with unspecified type of pneumonia: Secondary | ICD-10-CM | POA: Diagnosis not present

## 2016-09-16 DIAGNOSIS — J1000 Influenza due to other identified influenza virus with unspecified type of pneumonia: Secondary | ICD-10-CM | POA: Diagnosis not present

## 2016-09-16 DIAGNOSIS — Z7409 Other reduced mobility: Secondary | ICD-10-CM | POA: Diagnosis not present

## 2016-09-17 DIAGNOSIS — J1000 Influenza due to other identified influenza virus with unspecified type of pneumonia: Secondary | ICD-10-CM | POA: Diagnosis not present

## 2016-09-17 DIAGNOSIS — Z7409 Other reduced mobility: Secondary | ICD-10-CM | POA: Diagnosis not present

## 2016-09-20 DIAGNOSIS — Z7409 Other reduced mobility: Secondary | ICD-10-CM | POA: Diagnosis not present

## 2016-09-20 DIAGNOSIS — J1000 Influenza due to other identified influenza virus with unspecified type of pneumonia: Secondary | ICD-10-CM | POA: Diagnosis not present

## 2016-09-21 DIAGNOSIS — Z7409 Other reduced mobility: Secondary | ICD-10-CM | POA: Diagnosis not present

## 2016-09-21 DIAGNOSIS — J1000 Influenza due to other identified influenza virus with unspecified type of pneumonia: Secondary | ICD-10-CM | POA: Diagnosis not present

## 2016-09-22 DIAGNOSIS — F329 Major depressive disorder, single episode, unspecified: Secondary | ICD-10-CM | POA: Diagnosis not present

## 2016-09-22 DIAGNOSIS — Z7409 Other reduced mobility: Secondary | ICD-10-CM | POA: Diagnosis not present

## 2016-09-22 DIAGNOSIS — J1000 Influenza due to other identified influenza virus with unspecified type of pneumonia: Secondary | ICD-10-CM | POA: Diagnosis not present

## 2016-09-22 DIAGNOSIS — F0151 Vascular dementia with behavioral disturbance: Secondary | ICD-10-CM | POA: Diagnosis not present

## 2016-09-23 DIAGNOSIS — F329 Major depressive disorder, single episode, unspecified: Secondary | ICD-10-CM | POA: Diagnosis not present

## 2016-09-23 DIAGNOSIS — F0151 Vascular dementia with behavioral disturbance: Secondary | ICD-10-CM | POA: Diagnosis not present

## 2016-09-24 DIAGNOSIS — J1000 Influenza due to other identified influenza virus with unspecified type of pneumonia: Secondary | ICD-10-CM | POA: Diagnosis not present

## 2016-09-24 DIAGNOSIS — Z7409 Other reduced mobility: Secondary | ICD-10-CM | POA: Diagnosis not present

## 2016-09-29 DIAGNOSIS — F419 Anxiety disorder, unspecified: Secondary | ICD-10-CM | POA: Diagnosis not present

## 2016-09-29 DIAGNOSIS — I5032 Chronic diastolic (congestive) heart failure: Secondary | ICD-10-CM | POA: Diagnosis not present

## 2016-09-29 DIAGNOSIS — M1710 Unilateral primary osteoarthritis, unspecified knee: Secondary | ICD-10-CM | POA: Diagnosis not present

## 2016-09-29 DIAGNOSIS — K5900 Constipation, unspecified: Secondary | ICD-10-CM | POA: Diagnosis not present

## 2016-09-29 DIAGNOSIS — E039 Hypothyroidism, unspecified: Secondary | ICD-10-CM | POA: Diagnosis not present

## 2016-09-29 DIAGNOSIS — F329 Major depressive disorder, single episode, unspecified: Secondary | ICD-10-CM | POA: Diagnosis not present

## 2016-09-29 DIAGNOSIS — R54 Age-related physical debility: Secondary | ICD-10-CM | POA: Diagnosis not present

## 2016-09-29 DIAGNOSIS — I4891 Unspecified atrial fibrillation: Secondary | ICD-10-CM | POA: Diagnosis not present

## 2016-10-10 DIAGNOSIS — I110 Hypertensive heart disease with heart failure: Secondary | ICD-10-CM | POA: Diagnosis not present

## 2016-10-10 DIAGNOSIS — M4802 Spinal stenosis, cervical region: Secondary | ICD-10-CM | POA: Diagnosis not present

## 2016-10-10 DIAGNOSIS — J9691 Respiratory failure, unspecified with hypoxia: Secondary | ICD-10-CM | POA: Diagnosis not present

## 2016-10-10 DIAGNOSIS — I361 Nonrheumatic tricuspid (valve) insufficiency: Secondary | ICD-10-CM | POA: Diagnosis present

## 2016-10-10 DIAGNOSIS — B373 Candidiasis of vulva and vagina: Secondary | ICD-10-CM | POA: Diagnosis not present

## 2016-10-10 DIAGNOSIS — R2689 Other abnormalities of gait and mobility: Secondary | ICD-10-CM | POA: Diagnosis not present

## 2016-10-10 DIAGNOSIS — R262 Difficulty in walking, not elsewhere classified: Secondary | ICD-10-CM | POA: Diagnosis not present

## 2016-10-10 DIAGNOSIS — I509 Heart failure, unspecified: Secondary | ICD-10-CM | POA: Diagnosis not present

## 2016-10-10 DIAGNOSIS — F29 Unspecified psychosis not due to a substance or known physiological condition: Secondary | ICD-10-CM | POA: Diagnosis not present

## 2016-10-10 DIAGNOSIS — Z7409 Other reduced mobility: Secondary | ICD-10-CM | POA: Diagnosis not present

## 2016-10-10 DIAGNOSIS — J189 Pneumonia, unspecified organism: Secondary | ICD-10-CM | POA: Diagnosis not present

## 2016-10-10 DIAGNOSIS — J069 Acute upper respiratory infection, unspecified: Secondary | ICD-10-CM | POA: Diagnosis not present

## 2016-10-10 DIAGNOSIS — I071 Rheumatic tricuspid insufficiency: Secondary | ICD-10-CM | POA: Diagnosis not present

## 2016-10-10 DIAGNOSIS — B955 Unspecified streptococcus as the cause of diseases classified elsewhere: Secondary | ICD-10-CM | POA: Diagnosis not present

## 2016-10-10 DIAGNOSIS — R0902 Hypoxemia: Secondary | ICD-10-CM | POA: Diagnosis not present

## 2016-10-10 DIAGNOSIS — I482 Chronic atrial fibrillation: Secondary | ICD-10-CM | POA: Diagnosis not present

## 2016-10-10 DIAGNOSIS — I10 Essential (primary) hypertension: Secondary | ICD-10-CM | POA: Diagnosis not present

## 2016-10-10 DIAGNOSIS — M1280 Other specific arthropathies, not elsewhere classified, unspecified site: Secondary | ICD-10-CM | POA: Diagnosis not present

## 2016-10-10 DIAGNOSIS — B9689 Other specified bacterial agents as the cause of diseases classified elsewhere: Secondary | ICD-10-CM | POA: Diagnosis not present

## 2016-10-10 DIAGNOSIS — N390 Urinary tract infection, site not specified: Secondary | ICD-10-CM | POA: Diagnosis not present

## 2016-10-10 DIAGNOSIS — R41841 Cognitive communication deficit: Secondary | ICD-10-CM | POA: Diagnosis not present

## 2016-10-10 DIAGNOSIS — F419 Anxiety disorder, unspecified: Secondary | ICD-10-CM | POA: Diagnosis not present

## 2016-10-10 DIAGNOSIS — R489 Unspecified symbolic dysfunctions: Secondary | ICD-10-CM | POA: Diagnosis not present

## 2016-10-10 DIAGNOSIS — J1000 Influenza due to other identified influenza virus with unspecified type of pneumonia: Secondary | ICD-10-CM | POA: Diagnosis not present

## 2016-10-10 DIAGNOSIS — I4891 Unspecified atrial fibrillation: Secondary | ICD-10-CM | POA: Diagnosis not present

## 2016-10-10 DIAGNOSIS — F0281 Dementia in other diseases classified elsewhere with behavioral disturbance: Secondary | ICD-10-CM | POA: Diagnosis not present

## 2016-10-10 DIAGNOSIS — M6281 Muscle weakness (generalized): Secondary | ICD-10-CM | POA: Diagnosis not present

## 2016-10-10 DIAGNOSIS — I340 Nonrheumatic mitral (valve) insufficiency: Secondary | ICD-10-CM | POA: Diagnosis present

## 2016-10-10 DIAGNOSIS — R279 Unspecified lack of coordination: Secondary | ICD-10-CM | POA: Diagnosis not present

## 2016-10-10 DIAGNOSIS — R2681 Unsteadiness on feet: Secondary | ICD-10-CM | POA: Diagnosis not present

## 2016-10-10 DIAGNOSIS — R4789 Other speech disturbances: Secondary | ICD-10-CM | POA: Diagnosis not present

## 2016-10-10 DIAGNOSIS — R296 Repeated falls: Secondary | ICD-10-CM | POA: Diagnosis not present

## 2016-10-10 DIAGNOSIS — I350 Nonrheumatic aortic (valve) stenosis: Secondary | ICD-10-CM | POA: Diagnosis not present

## 2016-10-10 DIAGNOSIS — F339 Major depressive disorder, recurrent, unspecified: Secondary | ICD-10-CM | POA: Diagnosis not present

## 2016-10-10 DIAGNOSIS — J9601 Acute respiratory failure with hypoxia: Secondary | ICD-10-CM | POA: Diagnosis not present

## 2016-10-10 DIAGNOSIS — I5030 Unspecified diastolic (congestive) heart failure: Secondary | ICD-10-CM | POA: Diagnosis not present

## 2016-10-14 DIAGNOSIS — J069 Acute upper respiratory infection, unspecified: Secondary | ICD-10-CM | POA: Diagnosis not present

## 2016-10-14 DIAGNOSIS — R489 Unspecified symbolic dysfunctions: Secondary | ICD-10-CM | POA: Diagnosis not present

## 2016-10-14 DIAGNOSIS — F29 Unspecified psychosis not due to a substance or known physiological condition: Secondary | ICD-10-CM | POA: Diagnosis not present

## 2016-10-14 DIAGNOSIS — B373 Candidiasis of vulva and vagina: Secondary | ICD-10-CM | POA: Diagnosis not present

## 2016-10-14 DIAGNOSIS — M1280 Other specific arthropathies, not elsewhere classified, unspecified site: Secondary | ICD-10-CM | POA: Diagnosis not present

## 2016-10-14 DIAGNOSIS — J9601 Acute respiratory failure with hypoxia: Secondary | ICD-10-CM | POA: Diagnosis not present

## 2016-10-14 DIAGNOSIS — F0391 Unspecified dementia with behavioral disturbance: Secondary | ICD-10-CM | POA: Diagnosis not present

## 2016-10-14 DIAGNOSIS — M6281 Muscle weakness (generalized): Secondary | ICD-10-CM | POA: Diagnosis not present

## 2016-10-14 DIAGNOSIS — Z7409 Other reduced mobility: Secondary | ICD-10-CM | POA: Diagnosis not present

## 2016-10-14 DIAGNOSIS — J1000 Influenza due to other identified influenza virus with unspecified type of pneumonia: Secondary | ICD-10-CM | POA: Diagnosis not present

## 2016-10-14 DIAGNOSIS — R262 Difficulty in walking, not elsewhere classified: Secondary | ICD-10-CM | POA: Diagnosis not present

## 2016-10-14 DIAGNOSIS — F329 Major depressive disorder, single episode, unspecified: Secondary | ICD-10-CM | POA: Diagnosis not present

## 2016-10-14 DIAGNOSIS — F419 Anxiety disorder, unspecified: Secondary | ICD-10-CM | POA: Diagnosis not present

## 2016-10-14 DIAGNOSIS — R001 Bradycardia, unspecified: Secondary | ICD-10-CM | POA: Diagnosis not present

## 2016-10-14 DIAGNOSIS — F339 Major depressive disorder, recurrent, unspecified: Secondary | ICD-10-CM | POA: Diagnosis not present

## 2016-10-14 DIAGNOSIS — I350 Nonrheumatic aortic (valve) stenosis: Secondary | ICD-10-CM | POA: Diagnosis not present

## 2016-10-14 DIAGNOSIS — R296 Repeated falls: Secondary | ICD-10-CM | POA: Diagnosis not present

## 2016-10-14 DIAGNOSIS — J189 Pneumonia, unspecified organism: Secondary | ICD-10-CM | POA: Diagnosis not present

## 2016-10-14 DIAGNOSIS — R41841 Cognitive communication deficit: Secondary | ICD-10-CM | POA: Diagnosis not present

## 2016-10-14 DIAGNOSIS — R279 Unspecified lack of coordination: Secondary | ICD-10-CM | POA: Diagnosis not present

## 2016-10-14 DIAGNOSIS — I5033 Acute on chronic diastolic (congestive) heart failure: Secondary | ICD-10-CM | POA: Diagnosis not present

## 2016-10-14 DIAGNOSIS — S098XXA Other specified injuries of head, initial encounter: Secondary | ICD-10-CM | POA: Diagnosis not present

## 2016-10-14 DIAGNOSIS — F0281 Dementia in other diseases classified elsewhere with behavioral disturbance: Secondary | ICD-10-CM | POA: Diagnosis not present

## 2016-10-14 DIAGNOSIS — I5030 Unspecified diastolic (congestive) heart failure: Secondary | ICD-10-CM | POA: Diagnosis not present

## 2016-10-14 DIAGNOSIS — R2689 Other abnormalities of gait and mobility: Secondary | ICD-10-CM | POA: Diagnosis not present

## 2016-10-14 DIAGNOSIS — M4802 Spinal stenosis, cervical region: Secondary | ICD-10-CM | POA: Diagnosis not present

## 2016-10-14 DIAGNOSIS — I10 Essential (primary) hypertension: Secondary | ICD-10-CM | POA: Diagnosis not present

## 2016-10-14 DIAGNOSIS — F0151 Vascular dementia with behavioral disturbance: Secondary | ICD-10-CM | POA: Diagnosis not present

## 2016-10-14 DIAGNOSIS — N390 Urinary tract infection, site not specified: Secondary | ICD-10-CM | POA: Diagnosis not present

## 2016-10-14 DIAGNOSIS — I5032 Chronic diastolic (congestive) heart failure: Secondary | ICD-10-CM | POA: Diagnosis not present

## 2016-10-14 DIAGNOSIS — I482 Chronic atrial fibrillation: Secondary | ICD-10-CM | POA: Diagnosis not present

## 2016-10-14 DIAGNOSIS — R54 Age-related physical debility: Secondary | ICD-10-CM | POA: Diagnosis not present

## 2016-10-14 DIAGNOSIS — R2681 Unsteadiness on feet: Secondary | ICD-10-CM | POA: Diagnosis not present

## 2016-10-14 DIAGNOSIS — I4891 Unspecified atrial fibrillation: Secondary | ICD-10-CM | POA: Diagnosis not present

## 2016-10-14 DIAGNOSIS — R4789 Other speech disturbances: Secondary | ICD-10-CM | POA: Diagnosis not present

## 2016-10-15 DIAGNOSIS — F419 Anxiety disorder, unspecified: Secondary | ICD-10-CM | POA: Diagnosis not present

## 2016-10-15 DIAGNOSIS — I482 Chronic atrial fibrillation: Secondary | ICD-10-CM | POA: Diagnosis not present

## 2016-10-15 DIAGNOSIS — J9601 Acute respiratory failure with hypoxia: Secondary | ICD-10-CM | POA: Diagnosis not present

## 2016-10-15 DIAGNOSIS — R001 Bradycardia, unspecified: Secondary | ICD-10-CM | POA: Diagnosis not present

## 2016-10-15 DIAGNOSIS — I5033 Acute on chronic diastolic (congestive) heart failure: Secondary | ICD-10-CM | POA: Diagnosis not present

## 2016-10-15 DIAGNOSIS — F329 Major depressive disorder, single episode, unspecified: Secondary | ICD-10-CM | POA: Diagnosis not present

## 2016-10-15 DIAGNOSIS — R54 Age-related physical debility: Secondary | ICD-10-CM | POA: Diagnosis not present

## 2016-10-15 DIAGNOSIS — M6281 Muscle weakness (generalized): Secondary | ICD-10-CM | POA: Diagnosis not present

## 2016-10-20 DIAGNOSIS — F0151 Vascular dementia with behavioral disturbance: Secondary | ICD-10-CM | POA: Diagnosis not present

## 2016-10-20 DIAGNOSIS — F329 Major depressive disorder, single episode, unspecified: Secondary | ICD-10-CM | POA: Diagnosis not present

## 2016-10-25 DIAGNOSIS — F0151 Vascular dementia with behavioral disturbance: Secondary | ICD-10-CM | POA: Diagnosis not present

## 2016-10-25 DIAGNOSIS — F329 Major depressive disorder, single episode, unspecified: Secondary | ICD-10-CM | POA: Diagnosis not present

## 2016-11-03 DIAGNOSIS — F0151 Vascular dementia with behavioral disturbance: Secondary | ICD-10-CM | POA: Diagnosis not present

## 2016-11-03 DIAGNOSIS — F329 Major depressive disorder, single episode, unspecified: Secondary | ICD-10-CM | POA: Diagnosis not present

## 2016-11-05 DIAGNOSIS — F0391 Unspecified dementia with behavioral disturbance: Secondary | ICD-10-CM | POA: Diagnosis not present

## 2016-11-05 DIAGNOSIS — I5032 Chronic diastolic (congestive) heart failure: Secondary | ICD-10-CM | POA: Diagnosis not present

## 2016-11-05 DIAGNOSIS — F419 Anxiety disorder, unspecified: Secondary | ICD-10-CM | POA: Diagnosis not present

## 2016-11-05 DIAGNOSIS — S098XXA Other specified injuries of head, initial encounter: Secondary | ICD-10-CM | POA: Diagnosis not present

## 2016-11-05 DIAGNOSIS — I4891 Unspecified atrial fibrillation: Secondary | ICD-10-CM | POA: Diagnosis not present

## 2016-11-05 DIAGNOSIS — F329 Major depressive disorder, single episode, unspecified: Secondary | ICD-10-CM | POA: Diagnosis not present

## 2016-11-10 DIAGNOSIS — E569 Vitamin deficiency, unspecified: Secondary | ICD-10-CM | POA: Diagnosis not present

## 2016-11-10 DIAGNOSIS — I4891 Unspecified atrial fibrillation: Secondary | ICD-10-CM | POA: Diagnosis not present

## 2016-11-10 DIAGNOSIS — R498 Other voice and resonance disorders: Secondary | ICD-10-CM | POA: Diagnosis not present

## 2016-11-10 DIAGNOSIS — M549 Dorsalgia, unspecified: Secondary | ICD-10-CM | POA: Diagnosis not present

## 2016-11-10 DIAGNOSIS — F0151 Vascular dementia with behavioral disturbance: Secondary | ICD-10-CM | POA: Diagnosis not present

## 2016-11-10 DIAGNOSIS — B379 Candidiasis, unspecified: Secondary | ICD-10-CM | POA: Diagnosis not present

## 2016-11-10 DIAGNOSIS — M6281 Muscle weakness (generalized): Secondary | ICD-10-CM | POA: Diagnosis not present

## 2016-11-10 DIAGNOSIS — G8911 Acute pain due to trauma: Secondary | ICD-10-CM | POA: Diagnosis not present

## 2016-11-10 DIAGNOSIS — F0390 Unspecified dementia without behavioral disturbance: Secondary | ICD-10-CM | POA: Diagnosis not present

## 2016-11-10 DIAGNOSIS — Z5189 Encounter for other specified aftercare: Secondary | ICD-10-CM | POA: Diagnosis not present

## 2016-11-10 DIAGNOSIS — G933 Postviral fatigue syndrome: Secondary | ICD-10-CM | POA: Diagnosis not present

## 2016-11-10 DIAGNOSIS — M545 Low back pain: Secondary | ICD-10-CM | POA: Diagnosis not present

## 2016-11-10 DIAGNOSIS — I509 Heart failure, unspecified: Secondary | ICD-10-CM | POA: Diagnosis not present

## 2016-11-10 DIAGNOSIS — R4182 Altered mental status, unspecified: Secondary | ICD-10-CM | POA: Diagnosis not present

## 2016-11-10 DIAGNOSIS — K5900 Constipation, unspecified: Secondary | ICD-10-CM | POA: Diagnosis not present

## 2016-11-10 DIAGNOSIS — I10 Essential (primary) hypertension: Secondary | ICD-10-CM | POA: Diagnosis not present

## 2016-11-10 DIAGNOSIS — R41841 Cognitive communication deficit: Secondary | ICD-10-CM | POA: Diagnosis not present

## 2016-11-14 DIAGNOSIS — M549 Dorsalgia, unspecified: Secondary | ICD-10-CM | POA: Diagnosis not present

## 2016-11-14 DIAGNOSIS — G933 Postviral fatigue syndrome: Secondary | ICD-10-CM | POA: Diagnosis not present

## 2016-11-14 DIAGNOSIS — I10 Essential (primary) hypertension: Secondary | ICD-10-CM | POA: Diagnosis not present

## 2016-11-14 DIAGNOSIS — F0390 Unspecified dementia without behavioral disturbance: Secondary | ICD-10-CM | POA: Diagnosis not present

## 2016-11-14 DIAGNOSIS — I4891 Unspecified atrial fibrillation: Secondary | ICD-10-CM | POA: Diagnosis not present

## 2016-11-24 DIAGNOSIS — M545 Low back pain: Secondary | ICD-10-CM | POA: Diagnosis not present

## 2016-11-24 DIAGNOSIS — I10 Essential (primary) hypertension: Secondary | ICD-10-CM | POA: Diagnosis not present

## 2016-11-24 DIAGNOSIS — I4891 Unspecified atrial fibrillation: Secondary | ICD-10-CM | POA: Diagnosis not present

## 2016-11-24 DIAGNOSIS — G933 Postviral fatigue syndrome: Secondary | ICD-10-CM | POA: Diagnosis not present

## 2016-11-24 DIAGNOSIS — R4182 Altered mental status, unspecified: Secondary | ICD-10-CM | POA: Diagnosis not present

## 2016-11-24 DIAGNOSIS — F0390 Unspecified dementia without behavioral disturbance: Secondary | ICD-10-CM | POA: Diagnosis not present

## 2016-11-29 DIAGNOSIS — B379 Candidiasis, unspecified: Secondary | ICD-10-CM | POA: Diagnosis not present

## 2016-11-29 DIAGNOSIS — M6281 Muscle weakness (generalized): Secondary | ICD-10-CM | POA: Diagnosis not present

## 2016-11-29 DIAGNOSIS — I4891 Unspecified atrial fibrillation: Secondary | ICD-10-CM | POA: Diagnosis not present

## 2016-11-29 DIAGNOSIS — F0151 Vascular dementia with behavioral disturbance: Secondary | ICD-10-CM | POA: Diagnosis not present

## 2016-11-29 DIAGNOSIS — I509 Heart failure, unspecified: Secondary | ICD-10-CM | POA: Diagnosis not present

## 2016-11-29 DIAGNOSIS — E569 Vitamin deficiency, unspecified: Secondary | ICD-10-CM | POA: Diagnosis not present

## 2016-11-29 DIAGNOSIS — I10 Essential (primary) hypertension: Secondary | ICD-10-CM | POA: Diagnosis not present

## 2016-11-29 DIAGNOSIS — G8911 Acute pain due to trauma: Secondary | ICD-10-CM | POA: Diagnosis not present

## 2016-11-29 DIAGNOSIS — J17 Pneumonia in diseases classified elsewhere: Secondary | ICD-10-CM | POA: Diagnosis not present

## 2016-11-29 DIAGNOSIS — K5900 Constipation, unspecified: Secondary | ICD-10-CM | POA: Diagnosis not present

## 2016-12-02 DIAGNOSIS — G933 Postviral fatigue syndrome: Secondary | ICD-10-CM | POA: Diagnosis not present

## 2016-12-02 DIAGNOSIS — I4891 Unspecified atrial fibrillation: Secondary | ICD-10-CM | POA: Diagnosis not present

## 2016-12-02 DIAGNOSIS — F0390 Unspecified dementia without behavioral disturbance: Secondary | ICD-10-CM | POA: Diagnosis not present

## 2016-12-02 DIAGNOSIS — M549 Dorsalgia, unspecified: Secondary | ICD-10-CM | POA: Diagnosis not present

## 2016-12-02 DIAGNOSIS — I10 Essential (primary) hypertension: Secondary | ICD-10-CM | POA: Diagnosis not present

## 2016-12-04 ENCOUNTER — Emergency Department: Payer: Medicare Other

## 2016-12-04 ENCOUNTER — Inpatient Hospital Stay
Admission: EM | Admit: 2016-12-04 | Discharge: 2016-12-06 | DRG: 177 | Disposition: A | Discharge status: Skilled Nursing Facility | Payer: Medicare Other | Attending: Internal Medicine | Admitting: Internal Medicine

## 2016-12-04 DIAGNOSIS — Z880 Allergy status to penicillin: Secondary | ICD-10-CM

## 2016-12-04 DIAGNOSIS — J189 Pneumonia, unspecified organism: Secondary | ICD-10-CM | POA: Diagnosis present

## 2016-12-04 DIAGNOSIS — Z885 Allergy status to narcotic agent status: Secondary | ICD-10-CM

## 2016-12-04 DIAGNOSIS — I5031 Acute diastolic (congestive) heart failure: Secondary | ICD-10-CM | POA: Diagnosis present

## 2016-12-04 DIAGNOSIS — I4891 Unspecified atrial fibrillation: Secondary | ICD-10-CM | POA: Diagnosis not present

## 2016-12-04 DIAGNOSIS — N390 Urinary tract infection, site not specified: Secondary | ICD-10-CM | POA: Diagnosis present

## 2016-12-04 DIAGNOSIS — Z79899 Other long term (current) drug therapy: Secondary | ICD-10-CM

## 2016-12-04 DIAGNOSIS — R4182 Altered mental status, unspecified: Secondary | ICD-10-CM | POA: Diagnosis present

## 2016-12-04 DIAGNOSIS — I4821 Permanent atrial fibrillation: Secondary | ICD-10-CM

## 2016-12-04 DIAGNOSIS — Z8249 Family history of ischemic heart disease and other diseases of the circulatory system: Secondary | ICD-10-CM | POA: Diagnosis not present

## 2016-12-04 DIAGNOSIS — I482 Chronic atrial fibrillation: Secondary | ICD-10-CM | POA: Diagnosis present

## 2016-12-04 DIAGNOSIS — Z9989 Dependence on other enabling machines and devices: Secondary | ICD-10-CM | POA: Diagnosis not present

## 2016-12-04 DIAGNOSIS — Z888 Allergy status to other drugs, medicaments and biological substances status: Secondary | ICD-10-CM

## 2016-12-04 DIAGNOSIS — R32 Unspecified urinary incontinence: Secondary | ICD-10-CM | POA: Diagnosis present

## 2016-12-04 DIAGNOSIS — Z7982 Long term (current) use of aspirin: Secondary | ICD-10-CM | POA: Diagnosis not present

## 2016-12-04 DIAGNOSIS — I110 Hypertensive heart disease with heart failure: Secondary | ICD-10-CM | POA: Diagnosis present

## 2016-12-04 DIAGNOSIS — F329 Major depressive disorder, single episode, unspecified: Secondary | ICD-10-CM | POA: Diagnosis present

## 2016-12-04 DIAGNOSIS — J811 Chronic pulmonary edema: Secondary | ICD-10-CM | POA: Diagnosis not present

## 2016-12-04 DIAGNOSIS — I481 Persistent atrial fibrillation: Secondary | ICD-10-CM | POA: Diagnosis not present

## 2016-12-04 DIAGNOSIS — F29 Unspecified psychosis not due to a substance or known physiological condition: Secondary | ICD-10-CM | POA: Diagnosis present

## 2016-12-04 DIAGNOSIS — Z85828 Personal history of other malignant neoplasm of skin: Secondary | ICD-10-CM | POA: Diagnosis not present

## 2016-12-04 DIAGNOSIS — R0600 Dyspnea, unspecified: Secondary | ICD-10-CM | POA: Diagnosis not present

## 2016-12-04 DIAGNOSIS — R079 Chest pain, unspecified: Secondary | ICD-10-CM | POA: Diagnosis not present

## 2016-12-04 DIAGNOSIS — J9601 Acute respiratory failure with hypoxia: Secondary | ICD-10-CM | POA: Diagnosis present

## 2016-12-04 DIAGNOSIS — J690 Pneumonitis due to inhalation of food and vomit: Secondary | ICD-10-CM | POA: Diagnosis present

## 2016-12-04 DIAGNOSIS — R0603 Acute respiratory distress: Secondary | ICD-10-CM | POA: Diagnosis not present

## 2016-12-04 LAB — BLOOD GAS, ARTERIAL
Acid-Base Excess: 0 mmol/L (ref 0.0–2.0)
BICARBONATE: 24.8 mmol/L (ref 20.0–28.0)
FIO2: 0.36
O2 Saturation: 91.4 %
PH ART: 7.4 (ref 7.350–7.450)
Patient temperature: 37
pCO2 arterial: 40 mmHg (ref 32.0–48.0)
pO2, Arterial: 62 mmHg — ABNORMAL LOW (ref 83.0–108.0)

## 2016-12-04 LAB — BASIC METABOLIC PANEL
Anion gap: 8 (ref 5–15)
BUN: 21 mg/dL — ABNORMAL HIGH (ref 6–20)
CO2: 24 mmol/L (ref 22–32)
Calcium: 8.5 mg/dL — ABNORMAL LOW (ref 8.9–10.3)
Chloride: 105 mmol/L (ref 101–111)
Creatinine, Ser: 0.71 mg/dL (ref 0.44–1.00)
GFR calc Af Amer: >60 mL/min (ref >60)
GFR calc non Af Amer: >60 mL/min (ref >60)
Glucose, Bld: 179 mg/dL — ABNORMAL HIGH (ref 65–99)
Potassium: 4.1 mmol/L (ref 3.5–5.1)
Sodium: 137 mmol/L (ref 135–145)

## 2016-12-04 LAB — CBC WITH DIFFERENTIAL/PLATELET
Basophils Absolute: 0.1 10*3/uL (ref 0–0.1)
Basophils Relative: 1 %
Eosinophils Absolute: 0.3 10*3/uL (ref 0–0.7)
Eosinophils Relative: 2 %
HCT: 37.8 % (ref 35.0–47.0)
Hemoglobin: 12.4 g/dL (ref 12.0–16.0)
Lymphocytes Relative: 10 %
Lymphs Abs: 1.6 10*3/uL (ref 1.0–3.6)
MCH: 29.9 pg (ref 26.0–34.0)
MCHC: 32.9 g/dL (ref 32.0–36.0)
MCV: 91.1 fL (ref 80.0–100.0)
Monocytes Absolute: 0.6 10*3/uL (ref 0.2–0.9)
Monocytes Relative: 4 %
Neutro Abs: 13.1 10*3/uL — ABNORMAL HIGH (ref 1.4–6.5)
Neutrophils Relative %: 83 %
Platelets: 183 10*3/uL (ref 150–440)
RBC: 4.15 MIL/uL (ref 3.80–5.20)
RDW: 16.9 % — ABNORMAL HIGH (ref 11.5–14.5)
WBC: 15.8 10*3/uL — ABNORMAL HIGH (ref 3.6–11.0)

## 2016-12-04 LAB — TROPONIN I: Troponin I: <0.03 ng/mL (ref <0.03)

## 2016-12-04 LAB — DIGOXIN LEVEL: Digoxin Level: 0.4 ng/mL — ABNORMAL LOW (ref 0.8–2.0)

## 2016-12-04 MED ORDER — DEXTROSE 5 % IV SOLN: 2.0000 g | Freq: Once | INTRAVENOUS | Status: DC

## 2016-12-04 MED ORDER — ASPIRIN EC 81 MG PO TBEC
81.0000 mg | DELAYED_RELEASE_TABLET | Freq: Every day | ORAL | Status: DC
  Administered 2016-12-04 – 2016-12-06 (×3): 81 mg via ORAL
  Filled 2016-12-04 (×3): qty 1

## 2016-12-04 MED ORDER — VANCOMYCIN HCL IN DEXTROSE 1-5 GM/200ML-% IV SOLN
1000.0000 mg | INTRAVENOUS | Status: DC
  Administered 2016-12-04: 1000 mg via INTRAVENOUS
  Filled 2016-12-04 (×2): qty 200

## 2016-12-04 MED ORDER — SODIUM CHLORIDE 0.9 % IV SOLN: 250.0000 mL | INTRAVENOUS | Status: DC | PRN

## 2016-12-04 MED ORDER — PROMETHAZINE HCL 25 MG PO TABS
25.0000 mg | ORAL_TABLET | Freq: Four times a day (QID) | ORAL | Status: DC | PRN
  Administered 2016-12-04: 25 mg via ORAL
  Filled 2016-12-04: qty 1

## 2016-12-04 MED ORDER — IPRATROPIUM-ALBUTEROL 0.5-2.5 (3) MG/3ML IN SOLN
RESPIRATORY_TRACT | Status: AC
  Filled 2016-12-04: qty 3

## 2016-12-04 MED ORDER — METOPROLOL TARTRATE 25 MG PO TABS
25.0000 mg | ORAL_TABLET | Freq: Two times a day (BID) | ORAL | Status: DC
  Administered 2016-12-04 (×2): 25 mg via ORAL
  Filled 2016-12-04 (×2): qty 1

## 2016-12-04 MED ORDER — GUAIFENESIN 100 MG/5ML PO SOLN
10.0000 mL | ORAL | Status: DC | PRN
  Administered 2016-12-04: 200 mg via ORAL
  Filled 2016-12-04: qty 10

## 2016-12-04 MED ORDER — ALBUTEROL SULFATE (2.5 MG/3ML) 0.083% IN NEBU
5.0000 mg | INHALATION_SOLUTION | Freq: Once | RESPIRATORY_TRACT | Status: AC
  Administered 2016-12-04: 5 mg via RESPIRATORY_TRACT

## 2016-12-04 MED ORDER — CEFTRIAXONE SODIUM-DEXTROSE 2-2.22 GM-% IV SOLR
2.0000 g | Freq: Once | INTRAVENOUS | Status: AC
  Administered 2016-12-04: 2 g via INTRAVENOUS
  Filled 2016-12-04: qty 50

## 2016-12-04 MED ORDER — IPRATROPIUM-ALBUTEROL 0.5-2.5 (3) MG/3ML IN SOLN
3.0000 mL | Freq: Once | RESPIRATORY_TRACT | Status: AC
  Administered 2016-12-04: 3 mL via RESPIRATORY_TRACT

## 2016-12-04 MED ORDER — METRONIDAZOLE 500 MG PO TABS
500.0000 mg | ORAL_TABLET | Freq: Once | ORAL | Status: AC
  Administered 2016-12-04: 500 mg via ORAL
  Filled 2016-12-04: qty 1

## 2016-12-04 MED ORDER — FUROSEMIDE 10 MG/ML IJ SOLN
40.0000 mg | Freq: Two times a day (BID) | INTRAMUSCULAR | Status: DC
  Administered 2016-12-04 – 2016-12-05 (×3): 40 mg via INTRAVENOUS
  Filled 2016-12-04 (×4): qty 4

## 2016-12-04 MED ORDER — CEFTRIAXONE SODIUM-DEXTROSE 1-3.74 GM-% IV SOLR
INTRAVENOUS | Status: AC
  Filled 2016-12-04: qty 50

## 2016-12-04 MED ORDER — DEXTROSE 5 % IV SOLN
2.0000 g | Freq: Three times a day (TID) | INTRAVENOUS | Status: DC
  Administered 2016-12-04 – 2016-12-06 (×7): 2 g via INTRAVENOUS
  Filled 2016-12-04 (×10): qty 2

## 2016-12-04 MED ORDER — SODIUM CHLORIDE 0.9% FLUSH
3.0000 mL | INTRAVENOUS | Status: DC | PRN
  Administered 2016-12-05 – 2016-12-06 (×2): 3 mL via INTRAVENOUS
  Filled 2016-12-04 (×2): qty 3

## 2016-12-04 MED ORDER — VANCOMYCIN HCL IN DEXTROSE 1-5 GM/200ML-% IV SOLN
1000.0000 mg | Freq: Once | INTRAVENOUS | Status: AC
  Administered 2016-12-04: 1000 mg via INTRAVENOUS
  Filled 2016-12-04: qty 200

## 2016-12-04 MED ORDER — QUETIAPINE FUMARATE 25 MG PO TABS
25.0000 mg | ORAL_TABLET | Freq: Two times a day (BID) | ORAL | Status: DC
  Administered 2016-12-04 – 2016-12-06 (×4): 25 mg via ORAL
  Filled 2016-12-04 (×5): qty 1

## 2016-12-04 MED ORDER — SODIUM CHLORIDE 0.9% FLUSH
3.0000 mL | Freq: Two times a day (BID) | INTRAVENOUS | Status: DC
  Administered 2016-12-04 – 2016-12-06 (×3): 3 mL via INTRAVENOUS

## 2016-12-04 MED ORDER — LORAZEPAM 0.5 MG PO TABS
0.5000 mg | ORAL_TABLET | Freq: Three times a day (TID) | ORAL | Status: DC | PRN
  Administered 2016-12-04: 0.5 mg via ORAL
  Filled 2016-12-04: qty 1

## 2016-12-04 MED ORDER — DIGOXIN 125 MCG PO TABS
0.1250 mg | ORAL_TABLET | Freq: Every day | ORAL | Status: DC
  Administered 2016-12-04 – 2016-12-06 (×2): 0.125 mg via ORAL
  Filled 2016-12-04 (×3): qty 1

## 2016-12-04 MED ORDER — LAMOTRIGINE 100 MG PO TABS: 150.0000 mg | ORAL_TABLET | Freq: Every day | ORAL | Status: DC

## 2016-12-04 MED ORDER — DILTIAZEM HCL ER COATED BEADS 240 MG PO CP24
240.0000 mg | ORAL_CAPSULE | Freq: Every day | ORAL | Status: DC
  Administered 2016-12-04 – 2016-12-06 (×3): 240 mg via ORAL
  Filled 2016-12-04 (×6): qty 1

## 2016-12-04 MED ORDER — DEXTROSE 5 % IV SOLN
500.0000 mg | Freq: Once | INTRAVENOUS | Status: AC
  Administered 2016-12-04: 500 mg via INTRAVENOUS
  Filled 2016-12-04: qty 500

## 2016-12-04 MED ORDER — ALBUTEROL SULFATE (2.5 MG/3ML) 0.083% IN NEBU
INHALATION_SOLUTION | RESPIRATORY_TRACT | Status: AC
  Filled 2016-12-04: qty 6

## 2016-12-04 MED ORDER — ADULT MULTIVITAMIN W/MINERALS CH
1.0000 | ORAL_TABLET | Freq: Every day | ORAL | Status: DC
  Administered 2016-12-04 – 2016-12-06 (×3): 1 via ORAL
  Filled 2016-12-04 (×3): qty 1

## 2016-12-04 MED ORDER — ENOXAPARIN SODIUM 40 MG/0.4ML ~~LOC~~ SOLN
40.0000 mg | SUBCUTANEOUS | Status: DC
  Administered 2016-12-04 – 2016-12-05 (×2): 40 mg via SUBCUTANEOUS
  Filled 2016-12-04 (×2): qty 0.4

## 2016-12-04 NOTE — H&P (Signed)
[facility] [place] NAME: Heather Barker    MR#:  [id number]  DATE OF BIRTH:  08-15-1930  DATE OF ADMISSION:  12/04/2016  PRIMARY CARE PHYSICIAN: Juluis Pitch, MD   REQUESTING/REFERRING PHYSICIAN:   CHIEF COMPLAINT:   Chief Complaint  Patient presents with  . Respiratory Distress    HISTORY OF PRESENT ILLNESS: Heather Barker  is a 82 y.o. female with a known history of Atrial fibrillation, hypertension, osteoarthritis, psychosis, rosacea, spinal stenosis presented to the emergency room with difficulty breathing. [facility] She [facility] transferred for difficulty breathing and low oxygen saturation. Patient was put on 100% nonrebreather initially and later on weaned down to oxygen via nasal cannula at 3 L. Patient said she had some fruits alert and and crackers for dinner last night and then had an episode of vomiting. Has cough productive of phlegm and also had low-grade fever. No complaints of any chest pain. Has history of orthopnea. Patient was worked up in the emergency room she was found to have vascular congestion in the lungs and and also had elevated WBC count in her blood. She received IV antibiotics in the emergency room for pneumonia and the hospitalist service was consulted for further care of the patient.  PAST MEDICAL HISTORY:   Past Medical History:  Diagnosis Date  . Atrial fibrillation ([place])   . Basal cell cancer 1990's   forehead  . Depression   . Hypertension   . Osteoarthritis   . Psychosis   . Rosacea   . Stenosis, cervical spine   . Urinary incontinence     PAST SURGICAL HISTORY: Past Surgical History:  Procedure Laterality Date  . APPENDECTOMY    . CATARACT EXTRACTION     left eye  . CATARACT EXTRACTION  07/2010   right, IOL  . DILATION AND CURETTAGE OF UTERUS  1970's  . KNEE ARTHROSCOPY  1996   right   . ROTATOR CUFF REPAIR     left torn    SOCIAL HISTORY:   Social History  Substance Use Topics  . Smoking status: Never Smoker  . Smokeless tobacco: Never Used  . Alcohol use Yes     Comment: occasional    FAMILY HISTORY:  Family History  Problem Relation Age of Onset  . Cancer Mother     lung  . Hypertension Mother   . Heart disease Father   . Hypertension Father   . Diabetes Neg Hx     DRUG ALLERGIES:  Allergies  Allergen Reactions  . Codeine Phosphate     REACTION: intolerance--hydrocodone okay  . Meperidine Hcl     REACTION: unspecified  . Penicillins     REVIEW OF SYSTEMS:   CONSTITUTIONAL: Low grade fever, weakness.  EYES: No blurred or double vision.  EARS, NOSE, AND THROAT: No tinnitus or ear pain.  RESPIRATORY: Has cough,  Has shortness of breath  No wheezing or hemoptysis.  CARDIOVASCULAR: No chest pain,  has orthopnea, edema.  GASTROINTESTINAL: No nausea, diarrhea or abdominal pain.  Had an episode of vomiting GENITOURINARY: Has dysuria, no hematuria.  ENDOCRINE: No polyuria, nocturia,  HEMATOLOGY: No anemia, easy bruising or bleeding SKIN: No rash or lesion. MUSCULOSKELETAL: No joint pain or arthritis.   NEUROLOGIC: No tingling, numbness, weakness.  PSYCHIATRY: No anxiety or depression.   MEDICATIONS AT HOME:  Prior to Admission medications   Medication Sig Start Date End Date Taking? Authorizing Provider  acetaminophen (TYLENOL)  325 MG tablet Take 650 mg by mouth every 4 (four) hours as needed.    Historical Provider, MD  amoxicillin (AMOXIL) 500 MG capsule Take 500 mg by mouth 2 (two) times daily.    Historical Provider, MD  aspirin EC 81 MG tablet Take 81 mg by mouth daily.    Historical Provider, MD  digoxin (LANOXIN) 0.125 MG tablet Take 0.125 mg by mouth daily.    Historical Provider, MD  diltiazem (TIAZAC) 240 MG 24 hr capsule Take 240 mg by mouth daily.    Historical Provider, MD  guaiFENesin (ROBITUSSIN) 100 MG/5ML SOLN Take 10 mLs by mouth every 4 (four) hours as needed for cough or to loosen  phlegm.    Historical Provider, MD  hydrOXYzine (VISTARIL) 25 MG capsule Take 25 mg by mouth 3 (three) times daily as needed.    Historical Provider, MD  lamoTRIgine (LAMICTAL) 150 MG tablet Take 150 mg by mouth daily.    Historical Provider, MD  LORazepam (ATIVAN) 0.5 MG tablet Take 0.5 mg by mouth every 8 (eight) hours as needed for anxiety.    Historical Provider, MD  Menthol, Topical Analgesic, (BIOFREEZE) 4 % GEL Apply 1 application topically 2 (two) times daily.    Historical Provider, MD  metoprolol tartrate (LOPRESSOR) 25 MG tablet Take 25 mg by mouth 2 (two) times daily.    Historical Provider, MD  metroNIDAZOLE (METROGEL) 0.75 % gel Apply 1 application topically 2 (two) times daily.    Historical Provider, MD  Multiple Vitamin (MULTIVITAMIN) tablet Take 1 tablet by mouth daily.    Historical Provider, MD  promethazine (PHENERGAN) 25 MG tablet Take 25 mg by mouth every 6 (six) hours as needed for nausea or vomiting.    Historical Provider, MD  QUEtiapine (SEROQUEL) 25 MG tablet Take 25 mg by mouth 2 (two) times daily.    Historical Provider, MD      PHYSICAL EXAMINATION:   VITAL SIGNS: Blood pressure (!) 155/88, pulse (!) 117, resp. rate 18, height 5\' 2"  (1.575 m), weight 69.4 kg (153 lb), SpO2 94 %.  GENERAL:  82 y.o.-year-old patient lying in the bed in mild respiratory distress.  EYES: Pupils equal, round, reactive to light and accommodation. No scleral icterus. Extraocular muscles intact.  HEENT: Head atraumatic, normocephalic. Oropharynx and nasopharynx clear.  NECK:  Supple, no jugular venous distention. No thyroid enlargement, no tenderness.  LUNGS: Decreased breath sounds bilaterally, bibasilar crepitations heard, rales heard in right lung. No use of accessory muscles of respiration.  CARDIOVASCULAR: S1, S2 irregular. No murmurs, rubs, or gallops.  ABDOMEN: Soft, nontender, nondistended. Bowel sounds present. No organomegaly or mass.  EXTREMITIES: No pedal edema, cyanosis, or  clubbing.  NEUROLOGIC: Cranial nerves II through XII are intact. Muscle strength 5/5 in all extremities. Sensation intact. Gait not checked.  PSYCHIATRIC: The patient is alert and oriented x 3.  SKIN: No obvious rash, lesion, or ulcer.   LABORATORY PANEL:   CBC  Recent Labs Lab 12/04/16 0458  WBC 15.8*  HGB 12.4  HCT 37.8  PLT 183  MCV 91.1  MCH 29.9  MCHC 32.9  RDW 16.9*  LYMPHSABS 1.6  MONOABS 0.6  EOSABS 0.3  BASOSABS 0.1   ------------------------------------------------------------------------------------------------------------------  Chemistries   Recent Labs Lab 12/04/16 0458  NA 137  K 4.1  CL 105  CO2 24  GLUCOSE 179*  BUN 21*  CREATININE 0.71  CALCIUM 8.5*   ------------------------------------------------------------------------------------------------------------------ estimated creatinine clearance is 47.8 mL/min (by C-G formula based on SCr of 0.71  mg/dL). ------------------------------------------------------------------------------------------------------------------ No results for input(s): TSH, T4TOTAL, T3FREE, THYROIDAB in the last 72 hours.  Invalid input(s): FREET3   Coagulation profile No results for input(s): INR, PROTIME in the last 168 hours. ------------------------------------------------------------------------------------------------------------------- No results for input(s): DDIMER in the last 72 hours. -------------------------------------------------------------------------------------------------------------------  Cardiac Enzymes  Recent Labs Lab 12/04/16 0458  TROPONINI <0.03   ------------------------------------------------------------------------------------------------------------------ Invalid input(s): POCBNP  ---------------------------------------------------------------------------------------------------------------  Urinalysis    Component Value Date/Time   COLORURINE AMBER (A) 01/03/2016 1455    APPEARANCEUR CLEAR (A) 01/03/2016 1455   APPEARANCEUR Clear 08/16/2014 1611   LABSPEC 1.021 01/03/2016 1455   LABSPEC 1.011 08/16/2014 1611   PHURINE 5.0 01/03/2016 1455   GLUCOSEU NEGATIVE 01/03/2016 1455   GLUCOSEU Negative 08/16/2014 1611   HGBUR 2+ (A) 01/03/2016 1455   BILIRUBINUR NEGATIVE 01/03/2016 1455   BILIRUBINUR Negative 08/16/2014 1611   KETONESUR NEGATIVE 01/03/2016 1455   PROTEINUR NEGATIVE 01/03/2016 1455   NITRITE POSITIVE (A) 01/03/2016 1455   LEUKOCYTESUR TRACE (A) 01/03/2016 1455   LEUKOCYTESUR Negative 08/16/2014 1611     RADIOLOGY: Dg Chest Portable 1 View  Result Date: 12/04/2016 CLINICAL DATA:  Respiratory distress.  Hypoxia. EXAM: PORTABLE CHEST 1 VIEW COMPARISON:  01/02/2016 FINDINGS: There is moderate cardiomegaly, unchanged. There is new perihilar airspace opacity and there is new diffuse interstitial fluid or thickening. IMPRESSION: The findings likely represent congestive heart failure with interstitial and alveolar edema. Electronically Signed   By: Andreas Newport M.D.   On: 12/04/2016 05:16    EKG: Orders placed or performed during the hospital encounter of 12/04/16  . EKG 12-Lead  . EKG 12-Lead    IMPRESSION AND PLAN: 82 year old elderly female patient with history of atrial fibrillation, spinal stenosis, urinary incontinence, hypertension, osteoarthritis presented to the emergency room with the difficulty breathing, cough. Admitting diagnosis 1. Healthcare associated pneumonia 2. Pulmonary edema and congestion 3. Urinary tract infection 4. Hypoxia 5. Atrial fibrillation Treatment plan Admit patient to telemetry inpatient service Start patient on IV vancomycin and IV as time antibiotics Follow-up cultures Oxygen via nasal cannula Rate control with oral digoxin and Cardizem DVT prophylaxis subcutaneous Lovenox 40 MG daily Supportive care.  All the records are reviewed and case discussed with ED provider. Management plans discussed  with the patient, family and they are in agreement.  CODE STATUS:FULL CODE    Code Status Orders        Start     Ordered   12/04/16 0647  Full code  Continuous     12/04/16 0646    Code Status History    Date Active Date Inactive Code Status Order ID Comments User Context   This patient has a current code status but no historical code status.       TOTAL TIME TAKING CARE OF THIS PATIENT: 55 minutes.    Saundra Shelling M.D on 12/04/2016 at 6:48 AM  Between 7am to 6pm - Pager - [phone]  After 6pm go to [email or web address] - password [email or web address] [facility]  Office  [phone]  CC: Primary care physician; Juluis Pitch, MD

## 2016-12-04 NOTE — ED Provider Notes (Signed)
[facility] Emergency Department Provider Note  ____________________________________________  Time seen: Approximately 5:06 AM  I have reviewed the triage vital signs and the nursing notes.   HISTORY  Chief Complaint Respiratory Distress    HPI Heather Barker is a 82 y.o. female brought to the ED due to difficulty breathing. She was noted to be vomiting at her nursing home tonight, was given Zofran, but then 30 minutes later develop respiratory distress. When EMS arrived the patient had an oxygen saturation of 83% on room air. Required nonrebreather to bring her oxygen saturation above 90%. No history of CHF or COPD. Does not use oxygen at home.     Past Medical History:  Diagnosis Date  . Atrial fibrillation ([place])   . Basal cell cancer 1990's   forehead  . Depression   . Hypertension   . Osteoarthritis   . Psychosis   . Rosacea   . Stenosis, cervical spine   . Urinary incontinence      Patient Active Problem List   Diagnosis Date Noted  . Dementia with psychosis 01/02/2016  . Hypertension 01/02/2016  . OSTEOARTHRITIS 07/04/2008  . LEG PAIN 09/28/2007  . DEPRESSION 07/20/2007  . HYPERTENSION 07/20/2007  . ROSACEA 07/20/2007  . SPINAL STENOSIS, CERVICAL 07/20/2007  . URINARY INCONTINENCE 07/20/2007     Past Surgical History:  Procedure Laterality Date  . APPENDECTOMY    . CATARACT EXTRACTION     left eye  . CATARACT EXTRACTION  07/2010   right, IOL  . DILATION AND CURETTAGE OF UTERUS  1970's  . KNEE ARTHROSCOPY  1996   right   . ROTATOR CUFF REPAIR     left torn     Prior to Admission medications   Medication Sig Start Date End Date Taking? Authorizing Provider  acetaminophen (TYLENOL) 325 MG tablet Take 650 mg by mouth every 4 (four) hours as needed.    Historical Provider, MD  amoxicillin (AMOXIL) 500 MG capsule Take 500 mg by mouth 2 (two) times daily.    Historical Provider, MD  aspirin EC 81 MG tablet Take 81 mg by  mouth daily.    Historical Provider, MD  digoxin (LANOXIN) 0.125 MG tablet Take 0.125 mg by mouth daily.    Historical Provider, MD  diltiazem (TIAZAC) 240 MG 24 hr capsule Take 240 mg by mouth daily.    Historical Provider, MD  guaiFENesin (ROBITUSSIN) 100 MG/5ML SOLN Take 10 mLs by mouth every 4 (four) hours as needed for cough or to loosen phlegm.    Historical Provider, MD  hydrOXYzine (VISTARIL) 25 MG capsule Take 25 mg by mouth 3 (three) times daily as needed.    Historical Provider, MD  lamoTRIgine (LAMICTAL) 150 MG tablet Take 150 mg by mouth daily.    Historical Provider, MD  LORazepam (ATIVAN) 0.5 MG tablet Take 0.5 mg by mouth every 8 (eight) hours as needed for anxiety.    Historical Provider, MD  Menthol, Topical Analgesic, (BIOFREEZE) 4 % GEL Apply 1 application topically 2 (two) times daily.    Historical Provider, MD  metoprolol tartrate (LOPRESSOR) 25 MG tablet Take 25 mg by mouth 2 (two) times daily.    Historical Provider, MD  metroNIDAZOLE (METROGEL) 0.75 % gel Apply 1 application topically 2 (two) times daily.    Historical Provider, MD  Multiple Vitamin (MULTIVITAMIN) tablet Take 1 tablet by mouth daily.    Historical Provider, MD  promethazine (PHENERGAN) 25 MG tablet Take 25 mg by mouth every 6 (six)  hours as needed for nausea or vomiting.    Historical Provider, MD  QUEtiapine (SEROQUEL) 25 MG tablet Take 25 mg by mouth 2 (two) times daily.    Historical Provider, MD     Allergies Codeine phosphate; Meperidine hcl; and Penicillins   Family History  Problem Relation Age of Onset  . Cancer Mother     lung  . Hypertension Mother   . Heart disease Father   . Hypertension Father   . Diabetes Neg Hx     Social History Social History  Substance Use Topics  . Smoking status: Never Smoker  . Smokeless tobacco: Never Used  . Alcohol use Yes     Comment: occasional    Review of Systems  Constitutional:   No fever or chills.  ENT:   No sore throat. No  rhinorrhea. Cardiovascular:   No chest pain. Respiratory:   Positive shortness of breath and productive cough. Gastrointestinal:   Negative for abdominal pain, vomiting and diarrhea.  Genitourinary:   Negative for dysuria or difficulty urinating. Urinary incontinence Musculoskeletal:   Negative for focal pain or swelling Neurological:   Negative for headaches 10-point ROS otherwise negative.  ____________________________________________   PHYSICAL EXAM:  VITAL SIGNS: ED Triage Vitals  Enc Vitals Group     BP --      Pulse --      Resp --      Temp --      Temp src --      SpO2 12/04/16 0454 93 %     Weight 12/04/16 0458 153 lb (69.4 kg)     Height 12/04/16 0458 5\' 2"  (1.575 m)     Head Circumference --      [facility] Flow --      Pain Score --      Pain Loc --      Pain Edu? --      Excl. in [place]? --     Vital signs reviewed, nursing assessments reviewed.   Constitutional:   Alert and oriented. Well appearing and in no distress. Eyes:   No scleral icterus. No conjunctival pallor. PERRL. EOMI.  No nystagmus. ENT   Head:   Normocephalic and atraumatic.   Nose:   No congestion/rhinnorhea. No septal hematoma   Mouth/Throat:   MMM, no pharyngeal erythema. No peritonsillar mass.    Neck:   No stridor. No SubQ emphysema. No meningismus. Hematological/Lymphatic/Immunilogical:   No cervical lymphadenopathy. Cardiovascular:   Irregularly irregular rhythm, rate of about 100, rate controlled. Symmetric bilateral radial and DP pulses.  No murmurs.  Respiratory:   Room air oxygen saturation 78%. Increases to 93% on 10 L nonrebreather. Tachypnea. Accessory muscle use. Poor air entry in all lung fields. Wheezing on the right with expirations. Bibasilar crackles.. Gastrointestinal:   Soft and nontender. Non distended. There is no CVA tenderness.  No rebound, rigidity, or guarding. Genitourinary:   deferred Musculoskeletal:   Nontender with normal range of motion in all  extremities. No joint effusions.  No lower extremity tenderness.  No edema. Neurologic:   Normal speech and language.  CN 2-10 normal. Motor grossly intact. No gross focal neurologic deficits are appreciated.  Skin:    Skin is warm, dry and intact. No rash noted.  No petechiae, purpura, or bullae.  ____________________________________________    LABS (pertinent positives/negatives) (all labs ordered are listed, but only abnormal results are displayed) Labs Reviewed  BASIC METABOLIC PANEL - Abnormal; Notable for the following:  Result Value   Glucose, Bld 179 (*)    BUN 21 (*)    Calcium 8.5 (*)    All other components within normal limits  CBC WITH DIFFERENTIAL/PLATELET - Abnormal; Notable for the following:    WBC 15.8 (*)    RDW 16.9 (*)    Neutro Abs 13.1 (*)    All other components within normal limits  DIGOXIN LEVEL - Abnormal; Notable for the following:    Digoxin Level 0.4 (*)    All other components within normal limits  TROPONIN I  BLOOD GAS, ARTERIAL   ____________________________________________   EKG  Interpreted by me Atrial fibrillation rate 103, normal axis and intervals. Normal QRS ST segments or T waves  ____________________________________________    RADIOLOGY  Chest x-ray radiology read suggests cardiomegaly and interstitial thickening, hypothesized to be representative of CHF. Images reviewed by me, diffuse hazy opacities, consistent with clinical scenario of aspiration pneumonia  ____________________________________________   PROCEDURES Procedures CRITICAL CARE Performed by: Joni Fears, Karrah Mangini   Total critical care time: 35 minutes  Critical care time was exclusive of separately billable procedures and treating other patients.  Critical care was necessary to treat or prevent imminent or life-threatening deterioration.  Critical care was time spent personally by me on the following activities: development of treatment plan with  patient and/or surrogate as well as nursing, discussions with consultants, evaluation of patient's response to treatment, examination of patient, obtaining history from patient or surrogate, ordering and performing treatments and interventions, ordering and review of laboratory studies, ordering and review of radiographic studies, pulse oximetry and re-evaluation of patient's condition.  ____________________________________________   INITIAL IMPRESSION / ASSESSMENT AND PLAN / ED COURSE  Pertinent labs & imaging results that were available during my care of the patient were reviewed by me and considered in my medical decision making (see chart for details).  Patient presents with acute respiratory failure with severe hypoxia. Clinically not pneumothorax, low suspicion for PE. Suspect pneumonia, possibly due to aspiration. We'll give ceftriaxone and Flagyl for now, tabs. Continue supplemental oxygen. Labs and chest x-ray.    ----------------------------------------- 6:27 AM on 12/04/2016 -----------------------------------------  Labs reveal leukocytosis of 16,000. Hemodynamically stable. Oxygenation is improved after nebs, transitioned to nasal cannula 4 L maintaining sat of 94%. Case discussed with hospitalist for admission.   Clinical Course    ____________________________________________   FINAL CLINICAL IMPRESSION(S) / ED DIAGNOSES  Final diagnoses:  Acute respiratory failure with hypoxia (HCC)  Aspiration pneumonia of both lower lobes, unspecified aspiration pneumonia type [facility])  Permanent atrial fibrillation ([place])      New Prescriptions   No medications on file     Portions of this note were generated with dragon dictation software. Dictation errors may occur despite best attempts at proofreading.    Carrie Mew, MD 12/04/16 [phone]

## 2016-12-04 NOTE — Progress Notes (Signed)
Pharmacy Antibiotic Note  Heather Barker is a 82 y.o. female admitted on 12/04/2016 with pneumonia.  Pharmacy has been consulted for vancomycin dosing.  Plan: Vancomycin 1000 mg once followed by vancomycin 1000 mg IV q24h (8 hour stacked dose). Goal vancomycin trough 15-20 mcg/mL Vancomycin trough ordered for 12/12 at 1730  Kinetics: using adjusted body weight = 58 kg Ke: 0.048 Half-life: 14 hrs Cmin ~15 mcg/mL  Height: 5\' 2"  (157.5 cm) Weight: 153 lb (69.4 kg) IBW/kg (Calculated) : 50.1  No data recorded.   Recent Labs Lab 12/04/16 0458  WBC 15.8*  CREATININE 0.71    Estimated Creatinine Clearance: 47.8 mL/min (by C-G formula based on SCr of 0.71 mg/dL).    Allergies  Allergen Reactions  . Codeine Phosphate     REACTION: intolerance--hydrocodone okay  . Meperidine Hcl     REACTION: unspecified  . Penicillins     Antimicrobials this admission: CTX azithro in ED 12/9   >>   Dose adjustments this admission:  Microbiology results: 12/9 MRSA PCR: Sent  Thank you for allowing pharmacy to be a part of this patient's care.  Lenis Noon, PharmD, BCPS Clinical Pharmacist 12/04/2016 7:32 AM

## 2016-12-04 NOTE — ED Notes (Signed)
Pt taken off non-rebreather and placed on 4L [place] by RT. Pt O2 saturation currently 93%. RN will continue to monitor

## 2016-12-04 NOTE — ED Triage Notes (Signed)
Per EMS: Pt from [facility]. PT vomiting and given zofran. 30 minutes later patient in respiratory distress. Pt on 5L [place] when EMS arrived with O2 saturation of 84%. Pt placed on nonrebreather - O2 sat increased to 93%

## 2016-12-05 ENCOUNTER — Inpatient Hospital Stay
Admit: 2016-12-05 | Discharge: 2016-12-05 | Disposition: A | Discharge status: Home / Self Care | Payer: Medicare Other | Attending: Internal Medicine | Admitting: Internal Medicine

## 2016-12-05 LAB — BASIC METABOLIC PANEL
ANION GAP: 7 (ref 5–15)
BUN: 25 mg/dL — ABNORMAL HIGH (ref 6–20)
CALCIUM: 8.9 mg/dL (ref 8.9–10.3)
CHLORIDE: 101 mmol/L (ref 101–111)
CO2: 31 mmol/L (ref 22–32)
CREATININE: 0.81 mg/dL (ref 0.44–1.00)
GFR calc non Af Amer: >60 mL/min (ref >60)
GLUCOSE: 106 mg/dL — AB (ref 65–99)
Potassium: 4.4 mmol/L (ref 3.5–5.1)
Sodium: 139 mmol/L (ref 135–145)

## 2016-12-05 LAB — CBC
HEMATOCRIT: 37 % (ref 35.0–47.0)
HEMOGLOBIN: 12.5 g/dL (ref 12.0–16.0)
MCH: 29.8 pg (ref 26.0–34.0)
MCHC: 33.7 g/dL (ref 32.0–36.0)
MCV: 88.2 fL (ref 80.0–100.0)
Platelets: 185 10*3/uL (ref 150–440)
RBC: 4.2 MIL/uL (ref 3.80–5.20)
RDW: 16.9 % — AB (ref 11.5–14.5)
WBC: 9.9 10*3/uL (ref 3.6–11.0)

## 2016-12-05 LAB — HIV ANTIBODY (ROUTINE TESTING W REFLEX): HIV SCREEN 4TH GENERATION: NONREACTIVE

## 2016-12-05 LAB — MRSA PCR SCREENING: MRSA BY PCR: NEGATIVE

## 2016-12-05 MED ORDER — FUROSEMIDE 10 MG/ML IJ SOLN
20.0000 mg | Freq: Two times a day (BID) | INTRAMUSCULAR | Status: DC
  Administered 2016-12-05 – 2016-12-06 (×2): 20 mg via INTRAVENOUS
  Filled 2016-12-05 (×2): qty 2

## 2016-12-05 MED ORDER — DOCUSATE SODIUM 100 MG PO CAPS
100.0000 mg | ORAL_CAPSULE | Freq: Two times a day (BID) | ORAL | Status: DC | PRN
  Administered 2016-12-05: 100 mg via ORAL
  Filled 2016-12-05: qty 1

## 2016-12-05 MED ORDER — METOPROLOL TARTRATE 25 MG PO TABS
12.5000 mg | ORAL_TABLET | Freq: Two times a day (BID) | ORAL | Status: DC
  Administered 2016-12-05 – 2016-12-06 (×3): 12.5 mg via ORAL
  Filled 2016-12-05 (×3): qty 1

## 2016-12-05 NOTE — Progress Notes (Signed)
[facility] [place] NAME: Heather Barker    MR#:  [id number]  DATE OF BIRTH:  [birth year]  SUBJECTIVE:  CHIEF COMPLAINT:   Chief Complaint  Patient presents with  . Respiratory Distress    Sent with hypoxia and AMS    Concern for CHF and Pneumonia   Patient was confused yesterday, today currently alert and oriented and does not have any complaints.  REVIEW OF SYSTEMS:    Review of Systems  Constitutional: Negative for fever and weight loss.  HENT: Negative for congestion, ear discharge and hearing loss.   Eyes: Negative for blurred vision and double vision.  Respiratory: Negative for cough, sputum production and shortness of breath.   Cardiovascular: Negative for chest pain, palpitations, orthopnea and leg swelling.  Gastrointestinal: Negative for abdominal pain, constipation, nausea and vomiting.  Genitourinary: Negative for dysuria and frequency.  Skin: Negative for rash.  Neurological: Negative for dizziness, tremors and focal weakness.  Psychiatric/Behavioral: Negative for depression.    DRUG ALLERGIES:   Allergies  Allergen Reactions  . Codeine Phosphate     REACTION: intolerance--hydrocodone okay  . Meperidine Hcl     REACTION: unspecified  . Penicillins     VITALS:  Blood pressure 105/67, pulse 76, temperature 97.8 F (36.6 C), temperature source Oral, resp. rate 19, height 5' (1.524 m), weight 57.7 kg (127 lb 3.2 oz), SpO2 91 %.  PHYSICAL EXAMINATION:  GENERAL:  82 y.o.-year-old patient lying in the bed with no acute distress.  EYES: Pupils equal, round, reactive to light and accommodation. No scleral icterus. Extraocular muscles intact.  HEENT: Head atraumatic, normocephalic. Oropharynx and nasopharynx clear.  NECK:  Supple, no jugular venous distention. No thyroid enlargement, no tenderness.  LUNGS: Normal breath sounds bilaterally, no wheezing, some crepitation.  No use of accessory muscles of respiration. On  supplemental oxygen. CARDIOVASCULAR: S1, S2 normal. No murmurs, rubs, or gallops.  ABDOMEN: Soft, nontender, nondistended. Bowel sounds present. No organomegaly or mass.  EXTREMITIES: No pedal edema, no cyanosis, or clubbing.  NEUROLOGIC: pt is alert, follows, and, continue nose intact, power 4 out of 5 in upper extremities and 3 out of 5 in lower extremities, gait not checked. PSYCHIATRIC: The patient is alert and oriented X3.   SKIN: No obvious rash, lesion, or ulcer.   Physical Exam LABORATORY PANEL:   CBC  Recent Labs Lab 12/05/16 0923  WBC 9.9  HGB 12.5  HCT 37.0  PLT 185   ------------------------------------------------------------------------------------------------------------------  Chemistries   Recent Labs Lab 12/05/16 0923  NA 139  K 4.4  CL 101  CO2 31  GLUCOSE 106*  BUN 25*  CREATININE 0.81  CALCIUM 8.9   ------------------------------------------------------------------------------------------------------------------  Cardiac Enzymes  Recent Labs Lab 12/04/16 0458  TROPONINI <0.03   ------------------------------------------------------------------------------------------------------------------  RADIOLOGY:  Dg Chest Portable 1 View  Result Date: 12/04/2016 CLINICAL DATA:  Respiratory distress.  Hypoxia. EXAM: PORTABLE CHEST 1 VIEW COMPARISON:  01/02/2016 FINDINGS: There is moderate cardiomegaly, unchanged. There is new perihilar airspace opacity and there is new diffuse interstitial fluid or thickening. IMPRESSION: The findings likely represent congestive heart failure with interstitial and alveolar edema. Electronically Signed   By: Andreas Newport M.D.   On: 12/04/2016 05:16    ASSESSMENT AND PLAN:   Principal Problem:   Aspiration pneumonia ([place]) Active Problems:   Pneumonia  * Acute hypoxic respi failure   Pneumonia- HCAp   Ac CHF ( need to decide systolic vs diastolic)    Broad spectrum ABx  Checked MRSA PCR- negative- d/c  vanc   Follow cx.   IV lasix   On room air now.   Awaited echo result.  * Hypertension   Cont home meds, monitor  * A fib   Rate controled , cont cardizem  All the records are reviewed and case discussed with Care Management/Social Workerr. Management plans discussed with the patient, family and they are in agreement.  CODE STATUS: Full.  TOTAL TIME TAKING CARE OF THIS PATIENT: 35 minutes.   Also discussed with patient's healthcare power of attorney was present in the room today.    POSSIBLE D/C IN 1-2 DAYS, DEPENDING ON CLINICAL CONDITION.   Vaughan Basta M.D on 12/05/2016   Between 7am to 6pm - Pager - [phone]  After 6pm go to [email or web address] - password [email or web address] [facility]  Office  [phone]  CC: Primary care physician; Juluis Pitch, MD  Note: This dictation was prepared with Dragon dictation along with smaller phrase technology. Any transcriptional errors that result from this process are unintentional.

## 2016-12-05 NOTE — Progress Notes (Signed)
[facility] [place] NAME: Heather Barker    MR#:  [id number]  DATE OF BIRTH:  Mar 23, 1930  SUBJECTIVE:  CHIEF COMPLAINT:   Chief Complaint  Patient presents with  . Respiratory Distress    Sent with hypoxia and AMS    Concern for CHF and Pneumonia   Pt is confused, not giving any details or complains.  REVIEW OF SYSTEMS:   Because of altered mental status, not giving ROS.  ROS  DRUG ALLERGIES:   Allergies  Allergen Reactions  . Codeine Phosphate     REACTION: intolerance--hydrocodone okay  . Meperidine Hcl     REACTION: unspecified  . Penicillins     VITALS:  Blood pressure 98/63, pulse 68, temperature 97.6 F (36.4 C), temperature source Axillary, resp. rate 16, height 5' (1.524 m), weight 57.7 kg (127 lb 3.2 oz), SpO2 91 %.  PHYSICAL EXAMINATION:  GENERAL:  82 y.o.-year-old patient lying in the bed with no acute distress.  EYES: Pupils equal, round, reactive to light and accommodation. No scleral icterus. Extraocular muscles intact.  HEENT: Head atraumatic, normocephalic. Oropharynx and nasopharynx clear.  NECK:  Supple, no jugular venous distention. No thyroid enlargement, no tenderness.  LUNGS: Normal breath sounds bilaterally, no wheezing, some crepitation. someuse of accessory muscles of respiration. On supplemental oxygen. CARDIOVASCULAR: S1, S2 normal. No murmurs, rubs, or gallops.  ABDOMEN: Soft, nontender, nondistended. Bowel sounds present. No organomegaly or mass.  EXTREMITIES: No pedal edema, no cyanosis, or clubbing.  NEUROLOGIC: pt is alert, but confused, moves limbs spontaneously, but not following commands. PSYCHIATRIC: The patient is alert and confused.  SKIN: No obvious rash, lesion, or ulcer.   Physical Exam LABORATORY PANEL:   CBC  Recent Labs Lab 12/04/16 0458  WBC 15.8*  HGB 12.4  HCT 37.8  PLT 183    ------------------------------------------------------------------------------------------------------------------  Chemistries   Recent Labs Lab 12/04/16 0458  NA 137  K 4.1  CL 105  CO2 24  GLUCOSE 179*  BUN 21*  CREATININE 0.71  CALCIUM 8.5*   ------------------------------------------------------------------------------------------------------------------  Cardiac Enzymes  Recent Labs Lab 12/04/16 0458  TROPONINI <0.03   ------------------------------------------------------------------------------------------------------------------  RADIOLOGY:  Dg Chest Portable 1 View  Result Date: 12/04/2016 CLINICAL DATA:  Respiratory distress.  Hypoxia. EXAM: PORTABLE CHEST 1 VIEW COMPARISON:  01/02/2016 FINDINGS: There is moderate cardiomegaly, unchanged. There is new perihilar airspace opacity and there is new diffuse interstitial fluid or thickening. IMPRESSION: The findings likely represent congestive heart failure with interstitial and alveolar edema. Electronically Signed   By: Andreas Newport M.D.   On: 12/04/2016 05:16    ASSESSMENT AND PLAN:   Principal Problem:   Aspiration pneumonia (HCC) Active Problems:   Pneumonia  * Acute hypoxic respi failure   Pneumonia- HCAp   Ac CHF ( need to decide systolic vs diastolic)    Broad spectrum ABx   Check MRSA PCR   Follow cx.   IV lasix   Try to taper oxygen  * Hypertension   Cont home meds, monitor  * A fib   Rate controled , cont cardizem  All the records are reviewed and case discussed with Care Management/Social Workerr. Management plans discussed with the patient, family and they are in agreement.  CODE STATUS: Full.  TOTAL TIME TAKING CARE OF THIS PATIENT: 35 minutes.     POSSIBLE D/C IN 1-2 DAYS, DEPENDING ON CLINICAL CONDITION.   Vaughan Basta M.D on 12/05/2016   Between 7am to 6pm - Pager - [phone]  After 6pm go to [email or web address] - password [email or web address]  Office  [phone]  CC: Primary care physician; Juluis Pitch, MD  Note: This dictation was prepared with Dragon dictation along with smaller phrase technology. Any transcriptional errors that result from this process are unintentional.

## 2016-12-06 LAB — ECHOCARDIOGRAM COMPLETE
Height: 60 in
Weight: 2035.2 oz

## 2016-12-06 MED ORDER — LEVOFLOXACIN 500 MG PO TABS: 500.0000 mg | ORAL_TABLET | Freq: Every day | ORAL | 0 refills | Status: AC

## 2016-12-06 MED ORDER — QUETIAPINE FUMARATE 25 MG PO TABS: 25.0000 mg | ORAL_TABLET | Freq: Two times a day (BID) | ORAL | 0 refills | Status: AC

## 2016-12-06 MED ORDER — DILTIAZEM HCL ER COATED BEADS 240 MG PO CP24: 240.0000 mg | ORAL_CAPSULE | Freq: Every day | ORAL | 0 refills | Status: DC

## 2016-12-06 MED ORDER — METOPROLOL TARTRATE 25 MG PO TABS: 12.5000 mg | ORAL_TABLET | Freq: Two times a day (BID) | ORAL | 0 refills | Status: DC

## 2016-12-06 NOTE — Progress Notes (Signed)
EMS here to take pt back to [facility], vitals stable, pt denies pain.  Pt transported via stretcher with 2 EMS personnel. Conley Simmonds, RN, BSN  Vitals:   12/06/16 1204 12/06/16 2025  BP: (!) 100/53 (!) 119/52  Pulse: 60 60  Resp: 18 18  Temp: 97.7 F (36.5 C) 98.2 F (36.8 C)

## 2016-12-06 NOTE — Discharge Summary (Signed)
[facility] [place] NAME: Heather Barker    MR#:  [id number]  DATE OF BIRTH:  09/20/1930  DATE OF ADMISSION:  12/04/2016 ADMITTING PHYSICIAN: Saundra Shelling, MD  DATE OF DISCHARGE: 12/06/2016  PRIMARY CARE PHYSICIAN: Juluis Pitch, MD    ADMISSION DIAGNOSIS:  Permanent atrial fibrillation (HCC) [I48.2] Acute respiratory failure with hypoxia (HCC) [J96.01] Aspiration pneumonia of both lower lobes, unspecified aspiration pneumonia type ([place]) [J69.0]  DISCHARGE DIAGNOSIS:  Principal Problem:   Aspiration pneumonia ([place]) Active Problems:   Pneumonia   SECONDARY DIAGNOSIS:   Past Medical History:  Diagnosis Date  . Atrial fibrillation ([place])   . Basal cell cancer 1990's   forehead  . Depression   . Hypertension   . Osteoarthritis   . Psychosis   . Rosacea   . Stenosis, cervical spine   . Urinary incontinence     HOSPITAL COURSE:   * Acute hypoxic respi failure   Pneumonia- HCAp   Ac CHF ( need to decide systolic vs diastolic)- echo report is still not available.    Broad spectrum ABx   Checked MRSA PCR- negative- d/c vanc   Follow cx.   IV lasix   On room air now.   Awaited echo result.  pt felt back to baseline, so d/c with oral levaquin. ( Allergy to peniciline.)  * Hypertension   Cont home meds, monitor  * A fib   Rate controled , cont cardizem  DISCHARGE CONDITIONS:   Stable.  CONSULTS OBTAINED:    DRUG ALLERGIES:   Allergies  Allergen Reactions  . Codeine Phosphate     REACTION: intolerance--hydrocodone okay  . Meperidine Hcl     REACTION: unspecified  . Penicillins     DISCHARGE MEDICATIONS:   Current Discharge Medication List    START taking these medications   Details  diltiazem (CARDIZEM CD) 240 MG 24 hr capsule Take 1 capsule (240 mg total) by mouth daily. Qty: 30 capsule, Refills: 0    levofloxacin (LEVAQUIN) 500 MG tablet Take 1 tablet (500 mg total) by mouth  daily. Qty: 3 tablet, Refills: 0      CONTINUE these medications which have CHANGED   Details  metoprolol tartrate (LOPRESSOR) 25 MG tablet Take 0.5 tablets (12.5 mg total) by mouth 2 (two) times daily. Qty: 60 tablet, Refills: 0    QUEtiapine (SEROQUEL) 25 MG tablet Take 1 tablet (25 mg total) by mouth 2 (two) times daily. Qty: 60 tablet, Refills: 0      CONTINUE these medications which have NOT CHANGED   Details  acetaminophen (TYLENOL) 500 MG tablet Take 500 mg by mouth 2 (two) times daily.     aspirin EC 81 MG tablet Take 81 mg by mouth daily.    digoxin (LANOXIN) 0.125 MG tablet Take 0.125 mg by mouth daily.    lidocaine (LIDODERM) 5 % Place 1 patch onto the skin every 12 (twelve) hours. Remove & Discard patch within 12 hours or as directed by MD    meclizine (ANTIVERT) 12.5 MG tablet Take 12.5 mg by mouth 3 (three) times daily. At 0800, 1400, and 2000    metroNIDAZOLE (METROGEL) 0.75 % gel Apply 1 application topically 2 (two) times daily as needed.     Miconazole Nitrate 2 % AERP Apply topically. Apply topically to under breast and groin once a day 1500-2300    Multiple Vitamin (MULTIVITAMIN) tablet Take 1 tablet by mouth daily.    ondansetron (ZOFRAN) 4 MG  tablet Take 4 mg by mouth every 4 (four) hours as needed for nausea or vomiting.    senna-docusate (SENOKOT-S) 8.6-50 MG tablet Take 1 tablet by mouth 2 (two) times daily as needed for mild constipation.    Menthol, Topical Analgesic, (BIOFREEZE) 4 % GEL Apply 1 application topically 2 (two) times daily.      STOP taking these medications     diltiazem (TIAZAC) 180 MG 24 hr capsule      guaiFENesin (ROBITUSSIN) 100 MG/5ML SOLN      LORazepam (ATIVAN) 0.5 MG tablet      promethazine (PHENERGAN) 25 MG tablet          DISCHARGE INSTRUCTIONS:    Follow with PMD in office in 2 weeks.  If you experience worsening of your admission symptoms, develop shortness of breath, life threatening emergency, suicidal  or homicidal thoughts you must seek medical attention immediately by calling 911 or calling your MD immediately  if symptoms less severe.  You Must read complete instructions/literature along with all the possible adverse reactions/side effects for all the Medicines you take and that have been prescribed to you. Take any new Medicines after you have completely understood and accept all the possible adverse reactions/side effects.   Please note  You were cared for by a hospitalist during your hospital stay. If you have any questions about your discharge medications or the care you received while you were in the hospital after you are discharged, you can call the unit and asked to speak with the hospitalist on call if the hospitalist that took care of you is not available. Once you are discharged, your primary care physician will handle any further medical issues. Please note that NO REFILLS for any discharge medications will be authorized once you are discharged, as it is imperative that you return to your primary care physician (or establish a relationship with a primary care physician if you do not have one) for your aftercare needs so that they can reassess your need for medications and monitor your lab values.    Today   CHIEF COMPLAINT:   Chief Complaint  Patient presents with  . Respiratory Distress    HISTORY OF PRESENT ILLNESS:  Heather Barker  is a 82 y.o. female with a known history of a 82 y.o. female with a known history of Atrial fibrillation, hypertension, osteoarthritis, psychosis, rosacea, spinal stenosis presented to the emergency room with difficulty breathing. [facility] She was transferred for difficulty breathing and low oxygen saturation. Patient was put on 100% nonrebreather initially and later on weaned down to oxygen via nasal cannula at 3 L. Patient said she had some fruits alert and and crackers for dinner last night and then had an  episode of vomiting. Has cough productive of phlegm and also had low-grade fever. No complaints of any chest pain. Has history of orthopnea. Patient was worked up in the emergency room she was found to have vascular congestion in the lungs and and also had elevated WBC count in her blood. She received IV antibiotics in the emergency room for pneumonia and the hospitalist service was consulted for further care of the patient.   VITAL SIGNS:  Blood pressure (!) 100/53, pulse 60, temperature 97.7 F (36.5 C), temperature source Oral, resp. rate 18, height 5' (1.524 m), weight 53.5 kg (118 lb), SpO2 91 %.  I/O:   Intake/Output Summary (Last 24 hours) at 12/06/16 1539 Last data filed at 12/06/16 1452  Gross per 24 hour  Intake              580 ml  Output                0 ml  Net              580 ml    PHYSICAL EXAMINATION:   GENERAL:  82 y.o.-year-old patient lying in the bed with no acute distress.  EYES: Pupils equal, round, reactive to light and accommodation. No scleral icterus. Extraocular muscles intact.  HEENT: Head atraumatic, normocephalic. Oropharynx and nasopharynx clear.  NECK:  Supple, no jugular venous distention. No thyroid enlargement, no tenderness.  LUNGS: Normal breath sounds bilaterally, no wheezing, some crepitation.  No use of accessory muscles of respiration. On supplemental oxygen. CARDIOVASCULAR: S1, S2 normal. No murmurs, rubs, or gallops.  ABDOMEN: Soft, nontender, nondistended. Bowel sounds present. No organomegaly or mass.  EXTREMITIES: No pedal edema, no cyanosis, or clubbing.  NEUROLOGIC: pt is alert, follows, and, continue nose intact, power 4 out of 5 in upper extremities and 3 out of 5 in lower extremities, gait not checked. PSYCHIATRIC: The patient is alert and oriented X3.   SKIN: No obvious rash, lesion, or ulcer.   DATA REVIEW:   CBC  Recent Labs Lab 12/05/16 0923  WBC 9.9  HGB 12.5  HCT 37.0  PLT 185    Chemistries   Recent Labs Lab  12/05/16 0923  NA 139  K 4.4  CL 101  CO2 31  GLUCOSE 106*  BUN 25*  CREATININE 0.81  CALCIUM 8.9    Cardiac Enzymes  Recent Labs Lab 12/04/16 0458  TROPONINI <0.03    Microbiology Results  Results for orders placed or performed during the hospital encounter of 12/04/16  MRSA PCR Screening     Status: None   Collection Time: 12/04/16  8:20 AM  Result Value Ref Range Status   MRSA by PCR NEGATIVE NEGATIVE Final    Comment:        The GeneXpert MRSA Assay (FDA approved for NASAL specimens only), is one component of a comprehensive MRSA colonization surveillance program. It is not intended to diagnose MRSA infection nor to guide or monitor treatment for MRSA infections.     RADIOLOGY:  No results found.  EKG:   Orders placed or performed during the hospital encounter of 12/04/16  . EKG 12-Lead  . EKG 12-Lead    Management plans discussed with the patient, family and they are in agreement.  CODE STATUS:  full    Code Status Orders        Start     Ordered   12/04/16 0942  Full code  Continuous     12/04/16 0941    Code Status History    Date Active Date Inactive Code Status Order ID Comments User Context   12/04/2016  6:46 AM 12/04/2016  9:41 AM Full Code [id number]  Saundra Shelling, MD ED    Advance Directive Documentation   Flowsheet Row Most Recent Value  Type of Advance Directive  Out of facility DNR (pink MOST or yellow form)  Pre-existing out of facility DNR order (yellow form or pink MOST form)  No data  "MOST" Form in Place?  No data      TOTAL TIME TAKING CARE OF THIS PATIENT: 35 minutes.    Vaughan Basta M.D on 12/06/2016 at 3:39 PM  Between 7am to 6pm - Pager - [phone]  After 6pm go to [email or web address] -  password [email or web address] [facility]  Office  [phone]  CC: Primary care physician; Juluis Pitch, MD   Note: This dictation was prepared with Dragon dictation along with smaller phrase technology.  Any transcriptional errors that result from this process are unintentional.

## 2016-12-06 NOTE — Care Management Important Message (Signed)
Important Message  Patient Details  Name: AMEL HAGENS MRN: [id number] Date of Birth: 09-18-1930   Medicare Important Message Given:  Yes    Madelaine Whipple A, RN 12/06/2016, 7:56 AM

## 2016-12-06 NOTE — Clinical Social Work Note (Signed)
Patient to be d/c'ed today to [facility] [place].  Patient and family agreeable to plans will transport via ems RN to call report to room 307B [phone] call report to [facility].  Evette Cristal, MSW Mon-Fri 8a-4:30p [phone]

## 2016-12-06 NOTE — Clinical Social Work Note (Signed)
Clinical Social Work Assessment  Patient Details  Name: Heather Barker MRN: [id number] Date of Birth: [birth year]  Date of referral:  12/06/16               Reason for consult:  Facility Placement                Permission sought to share information with:  Facility Sport and exercise psychologist Permission granted to share information::  Yes, Verbal Permission Granted  Name::     Barker,Heather Other [phone]  [phone]   Agency::  SNF admissions  Relationship::     Contact Information:     Housing/Transportation Living arrangements for the past 2 months:  [place] of Information:  Patient Patient Interpreter Needed:  None Criminal Activity/Legal Involvement Pertinent to Current Situation/Hospitalization:  No - Comment as needed Significant Relationships:  Friend Lives with:  Facility Resident Do you feel safe going back to the place where you live?  Yes Need for family participation in patient care:  No (Coment)  Care giving concerns:  Patient is from [place] and she does not have any concerns about returning back.   Social Worker assessment / plan:  Patient is a 82 year old female who is alert and oriented x4 and able to express her feelings.  Patient stated [facility] and plans to return back to SNF.  Patient states she does not have any concerns or issues about returning to SNF.  Patient expressed that she used to be married but her husband died a few years ago and has been at [facility] since.  Patient did not express any other concerns or issues about returning to facility.  Employment status:  Retired Forensic scientist:  Medicare PT Recommendations:  Not assessed at this time [place] / Referral to community resources:  [place]  Patient/Family's Response to care:  Patient agreeable to returning back to SNF.  Patient/Family's Understanding of and Emotional Response to Diagnosis, Current  Treatment, and Prognosis:  Patient expresses that she is satisfied with returning back to SNF.  Emotional Assessment Appearance:  Appears stated age Attitude/Demeanor/Rapport:    Affect (typically observed):  Appropriate, Calm, Stable Orientation:  Oriented to Self, Oriented to Place, Oriented to  Time, Oriented to Situation Alcohol / Substance use:  Not Applicable Psych involvement (Current and /or in the community):  No (Comment)  Discharge Needs  Concerns to be addressed:  No discharge needs identified Readmission within the last 30 days:  No Current discharge risk:  None Barriers to Discharge:  No Barriers Identified   Ross Ludwig 12/06/2016, 5:03 PM

## 2016-12-06 NOTE — Progress Notes (Signed)
Pt to be discharged to [facility]. Iv and tele removed. Report called to the facility. Transport by ems.

## 2016-12-06 NOTE — NC FL2 (Signed)
[place] LEVEL OF CARE SCREENING TOOL     IDENTIFICATION  Patient Name: Heather Barker Birthdate: [birth year] Sex: female Admission Date (Current Location): 12/04/2016  [place] and [place] Number:  Selena Lesser [id number] [place] and Address:         Provider Number: [id number]  Attending Physician Name and Address:  Vaughan Basta, MD  Relative Name and Phone Number:  Deloatch,Evelyn Other [phone]  [phone]     Current Level of Care: Hospital Recommended Level of Care: [place] Prior Approval Number:    Date Approved/Denied:   PASRR Number: [id number]  Discharge Plan: SNF    Current Diagnoses: Patient Active Problem List   Diagnosis Date Noted  . Aspiration pneumonia ([place]) 12/04/2016  . Pneumonia 12/04/2016  . Dementia with psychosis 01/02/2016  . Hypertension 01/02/2016  . OSTEOARTHRITIS 07/04/2008  . LEG PAIN 09/28/2007  . DEPRESSION 07/20/2007  . HYPERTENSION 07/20/2007  . ROSACEA 07/20/2007  . SPINAL STENOSIS, CERVICAL 07/20/2007  . URINARY INCONTINENCE 07/20/2007    Orientation RESPIRATION BLADDER Height & Weight     Self, Time, Situation, Place  O2 (3L) Incontinent Weight: 118 lb (53.5 kg) Height:  5' (152.4 cm)  BEHAVIORAL SYMPTOMS/MOOD NEUROLOGICAL BOWEL NUTRITION STATUS      Continent Diet  AMBULATORY STATUS COMMUNICATION OF NEEDS Skin   Limited Assist Verbally Normal                       Personal Care Assistance Level of Assistance  Bathing, Feeding, Dressing Bathing Assistance: Limited assistance Feeding assistance: Independent Dressing Assistance: Limited assistance     Functional Limitations Info  Sight, Hearing, Speech Sight Info: Adequate Hearing Info: Adequate Speech Info: Adequate    SPECIAL CARE FACTORS FREQUENCY                       Contractures Contractures Info: Not present    Additional Factors Info  Psychotropic, Allergies   Allergies Info: CODEINE  PHOSPHATE, MEPERIDINE HCL, PENICILLINS  Psychotropic Info: QUEtiapine (SEROQUEL) tablet 25 mg         Current Medications (12/06/2016):  This is the current hospital active medication list Current Facility-Administered Medications  Medication Dose Route Frequency Provider Last Rate Last Dose  . 0.9 %  sodium chloride infusion  250 mL Intravenous PRN Saundra Shelling, MD      . aspirin EC tablet 81 mg  81 mg Oral Daily Pavan Pyreddy, MD   81 mg at 12/06/16 1025  . aztreonam (AZACTAM) 2 g in dextrose 5 % 50 mL IVPB  2 g Intravenous Q8H Pavan Pyreddy, MD   2 g at 12/06/16 0544  . digoxin (LANOXIN) tablet 0.125 mg  0.125 mg Oral Daily Pavan Pyreddy, MD   0.125 mg at 12/06/16 1025  . diltiazem (CARDIZEM CD) 24 hr capsule 240 mg  240 mg Oral Daily Saundra Shelling, MD   240 mg at 12/06/16 1025  . docusate sodium (COLACE) capsule 100 mg  100 mg Oral BID PRN Alexis Hugelmeyer, DO   100 mg at 12/05/16 2143  . enoxaparin (LOVENOX) injection 40 mg  40 mg Subcutaneous Q24H Pavan Pyreddy, MD   40 mg at 12/05/16 2143  . furosemide (LASIX) injection 20 mg  20 mg Intravenous Q12H Vaughan Basta, MD   20 mg at 12/06/16 [id number]  . guaiFENesin (ROBITUSSIN) 100 MG/5ML solution 200 mg  10 mL Oral Q4H PRN Saundra Shelling, MD   200 mg at 12/04/16 1216  . LORazepam (  ATIVAN) tablet 0.5 mg  0.5 mg Oral Q8H PRN Saundra Shelling, MD   0.5 mg at 12/04/16 1217  . metoprolol tartrate (LOPRESSOR) tablet 12.5 mg  12.5 mg Oral BID Vaughan Basta, MD   12.5 mg at 12/06/16 1024  . multivitamin with minerals tablet 1 tablet  1 tablet Oral Daily Saundra Shelling, MD   1 tablet at 12/06/16 1024  . promethazine (PHENERGAN) tablet 25 mg  25 mg Oral Q6H PRN Saundra Shelling, MD   25 mg at 12/04/16 1216  . QUEtiapine (SEROQUEL) tablet 25 mg  25 mg Oral BID Saundra Shelling, MD   25 mg at 12/06/16 1024  . sodium chloride flush (NS) 0.9 % injection 3 mL  3 mL Intravenous Q12H Pavan Pyreddy, MD   3 mL at 12/06/16 1025  . sodium chloride flush  (NS) 0.9 % injection 3 mL  3 mL Intravenous PRN Saundra Shelling, MD   3 mL at 12/06/16 [id number]     Discharge Medications: Please see discharge summary for a list of discharge medications.  Relevant Imaging Results:  Relevant Lab Results:   Additional Information SSN [id number]  Ross Ludwig

## 2016-12-10 DIAGNOSIS — R41841 Cognitive communication deficit: Secondary | ICD-10-CM | POA: Diagnosis not present

## 2016-12-10 DIAGNOSIS — R1312 Dysphagia, oropharyngeal phase: Secondary | ICD-10-CM | POA: Diagnosis not present

## 2016-12-10 DIAGNOSIS — G8911 Acute pain due to trauma: Secondary | ICD-10-CM | POA: Diagnosis not present

## 2016-12-10 DIAGNOSIS — I4891 Unspecified atrial fibrillation: Secondary | ICD-10-CM | POA: Diagnosis not present

## 2016-12-10 DIAGNOSIS — B379 Candidiasis, unspecified: Secondary | ICD-10-CM | POA: Diagnosis not present

## 2016-12-10 DIAGNOSIS — K5900 Constipation, unspecified: Secondary | ICD-10-CM | POA: Diagnosis not present

## 2016-12-10 DIAGNOSIS — R498 Other voice and resonance disorders: Secondary | ICD-10-CM | POA: Diagnosis not present

## 2016-12-10 DIAGNOSIS — I10 Essential (primary) hypertension: Secondary | ICD-10-CM | POA: Diagnosis not present

## 2016-12-10 DIAGNOSIS — F0151 Vascular dementia with behavioral disturbance: Secondary | ICD-10-CM | POA: Diagnosis not present

## 2016-12-10 DIAGNOSIS — E569 Vitamin deficiency, unspecified: Secondary | ICD-10-CM | POA: Diagnosis not present

## 2016-12-13 DIAGNOSIS — F0151 Vascular dementia with behavioral disturbance: Secondary | ICD-10-CM | POA: Diagnosis not present

## 2016-12-13 DIAGNOSIS — R1312 Dysphagia, oropharyngeal phase: Secondary | ICD-10-CM | POA: Diagnosis not present

## 2016-12-13 DIAGNOSIS — I4891 Unspecified atrial fibrillation: Secondary | ICD-10-CM | POA: Diagnosis not present

## 2016-12-13 DIAGNOSIS — R41841 Cognitive communication deficit: Secondary | ICD-10-CM | POA: Diagnosis not present

## 2016-12-13 DIAGNOSIS — R498 Other voice and resonance disorders: Secondary | ICD-10-CM | POA: Diagnosis not present

## 2016-12-13 DIAGNOSIS — G8911 Acute pain due to trauma: Secondary | ICD-10-CM | POA: Diagnosis not present

## 2016-12-14 DIAGNOSIS — R1312 Dysphagia, oropharyngeal phase: Secondary | ICD-10-CM | POA: Diagnosis not present

## 2016-12-14 DIAGNOSIS — R498 Other voice and resonance disorders: Secondary | ICD-10-CM | POA: Diagnosis not present

## 2016-12-14 DIAGNOSIS — I4891 Unspecified atrial fibrillation: Secondary | ICD-10-CM | POA: Diagnosis not present

## 2016-12-14 DIAGNOSIS — F0151 Vascular dementia with behavioral disturbance: Secondary | ICD-10-CM | POA: Diagnosis not present

## 2016-12-14 DIAGNOSIS — R41841 Cognitive communication deficit: Secondary | ICD-10-CM | POA: Diagnosis not present

## 2016-12-14 DIAGNOSIS — G8911 Acute pain due to trauma: Secondary | ICD-10-CM | POA: Diagnosis not present

## 2016-12-15 DIAGNOSIS — R498 Other voice and resonance disorders: Secondary | ICD-10-CM | POA: Diagnosis not present

## 2016-12-15 DIAGNOSIS — F0151 Vascular dementia with behavioral disturbance: Secondary | ICD-10-CM | POA: Diagnosis not present

## 2016-12-15 DIAGNOSIS — I4891 Unspecified atrial fibrillation: Secondary | ICD-10-CM | POA: Diagnosis not present

## 2016-12-15 DIAGNOSIS — R1312 Dysphagia, oropharyngeal phase: Secondary | ICD-10-CM | POA: Diagnosis not present

## 2016-12-15 DIAGNOSIS — R41841 Cognitive communication deficit: Secondary | ICD-10-CM | POA: Diagnosis not present

## 2016-12-15 DIAGNOSIS — G8911 Acute pain due to trauma: Secondary | ICD-10-CM | POA: Diagnosis not present

## 2016-12-16 DIAGNOSIS — G933 Postviral fatigue syndrome: Secondary | ICD-10-CM | POA: Diagnosis not present

## 2016-12-16 DIAGNOSIS — R1312 Dysphagia, oropharyngeal phase: Secondary | ICD-10-CM | POA: Diagnosis not present

## 2016-12-16 DIAGNOSIS — M545 Low back pain: Secondary | ICD-10-CM | POA: Diagnosis not present

## 2016-12-16 DIAGNOSIS — H8149 Vertigo of central origin, unspecified ear: Secondary | ICD-10-CM | POA: Diagnosis not present

## 2016-12-16 DIAGNOSIS — I4891 Unspecified atrial fibrillation: Secondary | ICD-10-CM | POA: Diagnosis not present

## 2016-12-16 DIAGNOSIS — R41841 Cognitive communication deficit: Secondary | ICD-10-CM | POA: Diagnosis not present

## 2016-12-16 DIAGNOSIS — G8911 Acute pain due to trauma: Secondary | ICD-10-CM | POA: Diagnosis not present

## 2016-12-16 DIAGNOSIS — R498 Other voice and resonance disorders: Secondary | ICD-10-CM | POA: Diagnosis not present

## 2016-12-16 DIAGNOSIS — F0151 Vascular dementia with behavioral disturbance: Secondary | ICD-10-CM | POA: Diagnosis not present

## 2016-12-16 DIAGNOSIS — I10 Essential (primary) hypertension: Secondary | ICD-10-CM | POA: Diagnosis not present

## 2016-12-16 DIAGNOSIS — J189 Pneumonia, unspecified organism: Secondary | ICD-10-CM | POA: Diagnosis not present

## 2016-12-17 DIAGNOSIS — R41841 Cognitive communication deficit: Secondary | ICD-10-CM | POA: Diagnosis not present

## 2016-12-17 DIAGNOSIS — I4891 Unspecified atrial fibrillation: Secondary | ICD-10-CM | POA: Diagnosis not present

## 2016-12-17 DIAGNOSIS — G8911 Acute pain due to trauma: Secondary | ICD-10-CM | POA: Diagnosis not present

## 2016-12-17 DIAGNOSIS — R498 Other voice and resonance disorders: Secondary | ICD-10-CM | POA: Diagnosis not present

## 2016-12-17 DIAGNOSIS — R1312 Dysphagia, oropharyngeal phase: Secondary | ICD-10-CM | POA: Diagnosis not present

## 2016-12-17 DIAGNOSIS — F0151 Vascular dementia with behavioral disturbance: Secondary | ICD-10-CM | POA: Diagnosis not present

## 2016-12-21 DIAGNOSIS — R41841 Cognitive communication deficit: Secondary | ICD-10-CM | POA: Diagnosis not present

## 2016-12-21 DIAGNOSIS — R1312 Dysphagia, oropharyngeal phase: Secondary | ICD-10-CM | POA: Diagnosis not present

## 2016-12-21 DIAGNOSIS — G8911 Acute pain due to trauma: Secondary | ICD-10-CM | POA: Diagnosis not present

## 2016-12-21 DIAGNOSIS — R498 Other voice and resonance disorders: Secondary | ICD-10-CM | POA: Diagnosis not present

## 2016-12-21 DIAGNOSIS — I4891 Unspecified atrial fibrillation: Secondary | ICD-10-CM | POA: Diagnosis not present

## 2016-12-21 DIAGNOSIS — F0151 Vascular dementia with behavioral disturbance: Secondary | ICD-10-CM | POA: Diagnosis not present

## 2017-01-09 ENCOUNTER — Emergency Department: Payer: Medicare Other

## 2017-01-09 ENCOUNTER — Inpatient Hospital Stay
Admission: EM | Admit: 2017-01-09 | Discharge: 2017-01-11 | DRG: 193 | Disposition: A | Discharge status: Skilled Nursing Facility | Payer: Medicare Other | Attending: Internal Medicine | Admitting: Internal Medicine

## 2017-01-09 DIAGNOSIS — I5032 Chronic diastolic (congestive) heart failure: Secondary | ICD-10-CM | POA: Diagnosis present

## 2017-01-09 DIAGNOSIS — I4891 Unspecified atrial fibrillation: Secondary | ICD-10-CM | POA: Diagnosis present

## 2017-01-09 DIAGNOSIS — Z79899 Other long term (current) drug therapy: Secondary | ICD-10-CM | POA: Diagnosis not present

## 2017-01-09 DIAGNOSIS — Z8701 Personal history of pneumonia (recurrent): Secondary | ICD-10-CM

## 2017-01-09 DIAGNOSIS — F329 Major depressive disorder, single episode, unspecified: Secondary | ICD-10-CM | POA: Diagnosis present

## 2017-01-09 DIAGNOSIS — Z880 Allergy status to penicillin: Secondary | ICD-10-CM

## 2017-01-09 DIAGNOSIS — J449 Chronic obstructive pulmonary disease, unspecified: Secondary | ICD-10-CM | POA: Diagnosis present

## 2017-01-09 DIAGNOSIS — Z885 Allergy status to narcotic agent status: Secondary | ICD-10-CM | POA: Diagnosis not present

## 2017-01-09 DIAGNOSIS — Z7401 Bed confinement status: Secondary | ICD-10-CM | POA: Diagnosis not present

## 2017-01-09 DIAGNOSIS — L719 Rosacea, unspecified: Secondary | ICD-10-CM | POA: Diagnosis present

## 2017-01-09 DIAGNOSIS — J9621 Acute and chronic respiratory failure with hypoxia: Secondary | ICD-10-CM | POA: Diagnosis present

## 2017-01-09 DIAGNOSIS — R0602 Shortness of breath: Secondary | ICD-10-CM | POA: Diagnosis not present

## 2017-01-09 DIAGNOSIS — R0902 Hypoxemia: Secondary | ICD-10-CM | POA: Diagnosis not present

## 2017-01-09 DIAGNOSIS — M6281 Muscle weakness (generalized): Secondary | ICD-10-CM

## 2017-01-09 DIAGNOSIS — I10 Essential (primary) hypertension: Secondary | ICD-10-CM | POA: Diagnosis not present

## 2017-01-09 DIAGNOSIS — Z888 Allergy status to other drugs, medicaments and biological substances status: Secondary | ICD-10-CM | POA: Diagnosis not present

## 2017-01-09 DIAGNOSIS — Z7982 Long term (current) use of aspirin: Secondary | ICD-10-CM | POA: Diagnosis not present

## 2017-01-09 DIAGNOSIS — Z85828 Personal history of other malignant neoplasm of skin: Secondary | ICD-10-CM | POA: Diagnosis not present

## 2017-01-09 DIAGNOSIS — J690 Pneumonitis due to inhalation of food and vomit: Secondary | ICD-10-CM

## 2017-01-09 DIAGNOSIS — Z9981 Dependence on supplemental oxygen: Secondary | ICD-10-CM | POA: Diagnosis not present

## 2017-01-09 DIAGNOSIS — Z8249 Family history of ischemic heart disease and other diseases of the circulatory system: Secondary | ICD-10-CM | POA: Diagnosis not present

## 2017-01-09 DIAGNOSIS — J189 Pneumonia, unspecified organism: Principal | ICD-10-CM | POA: Diagnosis present

## 2017-01-09 DIAGNOSIS — I110 Hypertensive heart disease with heart failure: Secondary | ICD-10-CM | POA: Diagnosis present

## 2017-01-09 DIAGNOSIS — J9620 Acute and chronic respiratory failure, unspecified whether with hypoxia or hypercapnia: Secondary | ICD-10-CM | POA: Diagnosis present

## 2017-01-09 DIAGNOSIS — J9600 Acute respiratory failure, unspecified whether with hypoxia or hypercapnia: Secondary | ICD-10-CM | POA: Diagnosis not present

## 2017-01-09 DIAGNOSIS — I509 Heart failure, unspecified: Secondary | ICD-10-CM | POA: Diagnosis not present

## 2017-01-09 HISTORY — DX: Heart failure, unspecified: I50.9

## 2017-01-09 LAB — TROPONIN I: Troponin I: <0.03 ng/mL (ref <0.03)

## 2017-01-09 LAB — CBC WITH DIFFERENTIAL/PLATELET
Basophils Absolute: 0.1 10*3/uL (ref 0–0.1)
Basophils Relative: 0 %
Eosinophils Absolute: 0.1 10*3/uL (ref 0–0.7)
Eosinophils Relative: 1 %
HEMATOCRIT: 35.3 % (ref 35.0–47.0)
HEMOGLOBIN: 11.7 g/dL — AB (ref 12.0–16.0)
LYMPHS ABS: 0.9 10*3/uL — AB (ref 1.0–3.6)
LYMPHS PCT: 6 %
MCH: 30.2 pg (ref 26.0–34.0)
MCHC: 33.2 g/dL (ref 32.0–36.0)
MCV: 91 fL (ref 80.0–100.0)
MONO ABS: 0.6 10*3/uL (ref 0.2–0.9)
MONOS PCT: 4 %
NEUTROS ABS: 14.5 10*3/uL — AB (ref 1.4–6.5)
NEUTROS PCT: 89 %
Platelets: 233 10*3/uL (ref 150–440)
RBC: 3.88 MIL/uL (ref 3.80–5.20)
RDW: 17.8 % — AB (ref 11.5–14.5)
WBC: 16.2 10*3/uL — ABNORMAL HIGH (ref 3.6–11.0)

## 2017-01-09 LAB — INFLUENZA PANEL BY PCR (TYPE A & B)
INFLBPCR: NEGATIVE
Influenza A By PCR: NEGATIVE

## 2017-01-09 LAB — DIGOXIN LEVEL: Digoxin Level: 0.9 ng/mL (ref 0.8–2.0)

## 2017-01-09 LAB — BASIC METABOLIC PANEL
Anion gap: 9 (ref 5–15)
BUN: 23 mg/dL — AB (ref 6–20)
CHLORIDE: 104 mmol/L (ref 101–111)
CO2: 24 mmol/L (ref 22–32)
Calcium: 8.5 mg/dL — ABNORMAL LOW (ref 8.9–10.3)
Creatinine, Ser: 0.64 mg/dL (ref 0.44–1.00)
GFR calc Af Amer: >60 mL/min (ref >60)
GLUCOSE: 158 mg/dL — AB (ref 65–99)
POTASSIUM: 4.6 mmol/L (ref 3.5–5.1)
Sodium: 137 mmol/L (ref 135–145)

## 2017-01-09 LAB — PROTIME-INR
INR: 1.14
Prothrombin Time: 14.7 seconds (ref 11.4–15.2)

## 2017-01-09 LAB — BRAIN NATRIURETIC PEPTIDE: B NATRIURETIC PEPTIDE 5: 113 pg/mL — AB (ref 0.0–100.0)

## 2017-01-09 LAB — MRSA PCR SCREENING: MRSA by PCR: POSITIVE — AB

## 2017-01-09 LAB — PROCALCITONIN

## 2017-01-09 MED ORDER — ACETAMINOPHEN 500 MG PO TABS
500.0000 mg | ORAL_TABLET | Freq: Two times a day (BID) | ORAL | Status: DC
  Administered 2017-01-09 – 2017-01-11 (×5): 500 mg via ORAL
  Filled 2017-01-09 (×5): qty 1

## 2017-01-09 MED ORDER — ORAL CARE MOUTH RINSE
15.0000 mL | Freq: Two times a day (BID) | OROMUCOSAL | Status: DC
  Administered 2017-01-09 – 2017-01-11 (×4): 15 mL via OROMUCOSAL

## 2017-01-09 MED ORDER — VANCOMYCIN HCL IN DEXTROSE 1-5 GM/200ML-% IV SOLN: 1000.0000 mg | Freq: Once | INTRAVENOUS | Status: DC

## 2017-01-09 MED ORDER — IPRATROPIUM-ALBUTEROL 0.5-2.5 (3) MG/3ML IN SOLN
3.0000 mL | RESPIRATORY_TRACT | Status: DC
  Administered 2017-01-09 (×3): 3 mL via RESPIRATORY_TRACT
  Filled 2017-01-09 (×3): qty 3

## 2017-01-09 MED ORDER — MUPIROCIN 2 % EX OINT
1.0000 "application " | TOPICAL_OINTMENT | Freq: Two times a day (BID) | CUTANEOUS | Status: DC
  Administered 2017-01-09 – 2017-01-11 (×4): 1 via NASAL
  Filled 2017-01-09 (×2): qty 22

## 2017-01-09 MED ORDER — MUSCLE RUB 10-15 % EX CREA
TOPICAL_CREAM | Freq: Two times a day (BID) | CUTANEOUS | Status: DC | PRN
  Filled 2017-01-09: qty 85

## 2017-01-09 MED ORDER — VANCOMYCIN HCL IN DEXTROSE 1-5 GM/200ML-% IV SOLN
1000.0000 mg | INTRAVENOUS | Status: DC
  Administered 2017-01-09: 1000 mg via INTRAVENOUS
  Filled 2017-01-09 (×2): qty 200

## 2017-01-09 MED ORDER — LEVOFLOXACIN IN D5W 750 MG/150ML IV SOLN
750.0000 mg | Freq: Once | INTRAVENOUS | Status: AC
  Administered 2017-01-09: 750 mg via INTRAVENOUS
  Filled 2017-01-09: qty 150

## 2017-01-09 MED ORDER — HEPARIN SODIUM (PORCINE) 5000 UNIT/ML IJ SOLN
5000.0000 [IU] | Freq: Three times a day (TID) | INTRAMUSCULAR | Status: DC
  Administered 2017-01-09 – 2017-01-11 (×7): 5000 [IU] via SUBCUTANEOUS
  Filled 2017-01-09 (×7): qty 1

## 2017-01-09 MED ORDER — VANCOMYCIN HCL IN DEXTROSE 1-5 GM/200ML-% IV SOLN
1000.0000 mg | Freq: Once | INTRAVENOUS | Status: AC
  Administered 2017-01-09: 1000 mg via INTRAVENOUS
  Filled 2017-01-09: qty 200

## 2017-01-09 MED ORDER — ONDANSETRON HCL 4 MG PO TABS: 4.0000 mg | ORAL_TABLET | ORAL | Status: DC | PRN

## 2017-01-09 MED ORDER — MECLIZINE HCL 25 MG PO TABS
12.5000 mg | ORAL_TABLET | Freq: Three times a day (TID) | ORAL | Status: DC
  Administered 2017-01-09 – 2017-01-11 (×7): 12.5 mg via ORAL
  Filled 2017-01-09 (×7): qty 1

## 2017-01-09 MED ORDER — QUETIAPINE FUMARATE 25 MG PO TABS
25.0000 mg | ORAL_TABLET | Freq: Two times a day (BID) | ORAL | Status: DC
  Administered 2017-01-09 – 2017-01-11 (×4): 25 mg via ORAL
  Filled 2017-01-09 (×4): qty 1

## 2017-01-09 MED ORDER — ASPIRIN EC 81 MG PO TBEC
81.0000 mg | DELAYED_RELEASE_TABLET | Freq: Every day | ORAL | Status: DC
  Administered 2017-01-09 – 2017-01-11 (×3): 81 mg via ORAL
  Filled 2017-01-09 (×3): qty 1

## 2017-01-09 MED ORDER — BIOTIN 10 MG PO TABS: 10.0000 mg | ORAL_TABLET | Freq: Every day | ORAL | Status: DC

## 2017-01-09 MED ORDER — ADULT MULTIVITAMIN W/MINERALS CH
1.0000 | ORAL_TABLET | Freq: Every day | ORAL | Status: DC
  Administered 2017-01-09 – 2017-01-11 (×3): 1 via ORAL
  Filled 2017-01-09 (×3): qty 1

## 2017-01-09 MED ORDER — ALPRAZOLAM 0.25 MG PO TABS: 0.2500 mg | ORAL_TABLET | Freq: Two times a day (BID) | ORAL | Status: DC | PRN

## 2017-01-09 MED ORDER — ONE-DAILY MULTI VITAMINS PO TABS: 1.0000 | ORAL_TABLET | Freq: Every day | ORAL | Status: DC

## 2017-01-09 MED ORDER — CHLORHEXIDINE GLUCONATE CLOTH 2 % EX PADS
6.0000 | MEDICATED_PAD | Freq: Every day | CUTANEOUS | Status: DC
  Administered 2017-01-10 – 2017-01-11 (×2): 6 via TOPICAL

## 2017-01-09 MED ORDER — METOPROLOL TARTRATE 25 MG PO TABS
12.5000 mg | ORAL_TABLET | Freq: Two times a day (BID) | ORAL | Status: DC
  Administered 2017-01-09 – 2017-01-11 (×5): 12.5 mg via ORAL
  Filled 2017-01-09 (×5): qty 1

## 2017-01-09 MED ORDER — MENTHOL (TOPICAL ANALGESIC) 4 % EX GEL: 1.0000 "application " | Freq: Two times a day (BID) | CUTANEOUS | Status: DC

## 2017-01-09 MED ORDER — SENNOSIDES-DOCUSATE SODIUM 8.6-50 MG PO TABS: 1.0000 | ORAL_TABLET | Freq: Two times a day (BID) | ORAL | Status: DC | PRN

## 2017-01-09 MED ORDER — LEVOFLOXACIN 500 MG PO TABS
750.0000 mg | ORAL_TABLET | ORAL | Status: DC
  Administered 2017-01-11: 750 mg via ORAL
  Filled 2017-01-09: qty 2

## 2017-01-09 MED ORDER — DIGOXIN 125 MCG PO TABS
0.1250 mg | ORAL_TABLET | Freq: Every day | ORAL | Status: DC
  Administered 2017-01-10 – 2017-01-11 (×2): 0.125 mg via ORAL
  Filled 2017-01-09 (×2): qty 1

## 2017-01-09 MED ORDER — DILTIAZEM HCL ER COATED BEADS 240 MG PO CP24
240.0000 mg | ORAL_CAPSULE | Freq: Every day | ORAL | Status: DC
  Administered 2017-01-09 – 2017-01-11 (×3): 240 mg via ORAL
  Filled 2017-01-09 (×3): qty 1

## 2017-01-09 MED ORDER — SODIUM CHLORIDE 0.9% FLUSH
3.0000 mL | Freq: Two times a day (BID) | INTRAVENOUS | Status: DC
  Administered 2017-01-09 – 2017-01-11 (×5): 3 mL via INTRAVENOUS

## 2017-01-09 NOTE — ED Notes (Signed)
Unable to give report due to high 02 needs. Pt needs to be assessed by respiratory for 02 needs prior to report. Respiratory called and [facility] [place] to assess pt.

## 2017-01-09 NOTE — H&P (Signed)
[facility] [facility] [place] NAME: Heather Barker    MR#:  [id number]  DATE OF BIRTH:  01-18-1930  DATE OF ADMISSION:  01/09/2017  PRIMARY CARE PHYSICIAN: Juluis Pitch, MD   REQUESTING/REFERRING PHYSICIAN: [place]  CHIEF COMPLAINT:   Chief Complaint  Patient presents with  . Shortness of Breath    HISTORY OF PRESENT ILLNESS: Heather Barker  is a 83 y.o. female with a known history of Atrial fibrillation, basal cell carcinoma, CHF, depression, hypertension, osteoarthritis, urinary incontinence- was treated for pneumonia last month in hospital and sent to the rehabilitation center back. She was at her baseline using 2 L of oxygen as needed basis mainly on the nighttime. She started having some cough and chills for last 1-2 days gradually getting worse. Also requiring more oxygen and so she was little more anxious today. Concerned with her hypoxia she was sent back to hospital from a nursing home. Found to have pneumonia on chest x-ray. Blood pressure is stable and patient is on increased requirement of oxygen than her baseline.  PAST MEDICAL HISTORY:   Past Medical History:  Diagnosis Date  . Atrial fibrillation ([place])   . Basal cell cancer 1990's   forehead  . CHF (congestive heart failure) ([place])   . Depression   . Hypertension   . Osteoarthritis   . Psychosis   . Rosacea   . Stenosis, cervical spine   . Urinary incontinence     PAST SURGICAL HISTORY: Past Surgical History:  Procedure Laterality Date  . APPENDECTOMY    . CATARACT EXTRACTION     left eye  . CATARACT EXTRACTION  07/2010   right, IOL  . DILATION AND CURETTAGE OF UTERUS  1970's  . KNEE ARTHROSCOPY  1996   right   . ROTATOR CUFF REPAIR     left torn    SOCIAL HISTORY:  Social History  Substance Use Topics  . Smoking status: Never Smoker  . Smokeless tobacco: Never Used  . Alcohol use Yes     Comment: occasional    FAMILY HISTORY:  Family History  Problem  Relation Age of Onset  . Cancer Mother     lung  . Hypertension Mother   . Heart disease Father   . Hypertension Father   . Diabetes Neg Hx     DRUG ALLERGIES:  Allergies  Allergen Reactions  . Codeine Phosphate     REACTION: intolerance--hydrocodone okay  . Meperidine Hcl     REACTION: unspecified  . Penicillins     REVIEW OF SYSTEMS:   CONSTITUTIONAL: No fever, fatigue or weakness.  EYES: No blurred or double vision.  EARS, NOSE, AND THROAT: No tinnitus or ear pain.  RESPIRATORY: Positive for cough, shortness of breath, no wheezing or hemoptysis.  CARDIOVASCULAR: No chest pain, orthopnea, edema.  GASTROINTESTINAL: No nausea, vomiting, diarrhea or abdominal pain.  GENITOURINARY: No dysuria, hematuria.  ENDOCRINE: No polyuria, nocturia,  HEMATOLOGY: No anemia, easy bruising or bleeding SKIN: No rash or lesion. MUSCULOSKELETAL: No joint pain or arthritis.   NEUROLOGIC: No tingling, numbness, weakness.  PSYCHIATRY: No anxiety or depression.   MEDICATIONS AT HOME:  Prior to Admission medications   Medication Sig Start Date End Date Taking? Authorizing Provider  acetaminophen (TYLENOL) 500 MG tablet Take 500 mg by mouth 2 (two) times daily.    Yes Historical Provider, MD  aspirin EC 81 MG tablet Take 81 mg by mouth daily.   Yes Historical Provider, MD  Biotin 10  MG TABS Take 10 mg by mouth daily.   Yes Historical Provider, MD  digoxin (LANOXIN) 0.125 MG tablet Take 0.125 mg by mouth daily.   Yes Historical Provider, MD  diltiazem (CARDIZEM CD) 240 MG 24 hr capsule Take 1 capsule (240 mg total) by mouth daily. 12/07/16  Yes Vaughan Basta, MD  lidocaine (LIDODERM) 5 % Place 1 patch onto the skin every 12 (twelve) hours. Remove & Discard patch within 12 hours or as directed by MD   Yes Historical Provider, MD  Menthol, Topical Analgesic, (BIOFREEZE) 4 % GEL Apply 1 application topically 2 (two) times daily.   Yes Historical Provider, MD  metoprolol tartrate (LOPRESSOR)  25 MG tablet Take 0.5 tablets (12.5 mg total) by mouth 2 (two) times daily. 12/06/16  Yes Vaughan Basta, MD  metroNIDAZOLE (METROGEL) 0.75 % gel Apply 1 application topically 2 (two) times daily as needed.    Yes Historical Provider, MD  QUEtiapine (SEROQUEL) 25 MG tablet Take 1 tablet (25 mg total) by mouth 2 (two) times daily. 12/06/16  Yes Vaughan Basta, MD  senna-docusate (SENOKOT-S) 8.6-50 MG tablet Take 1 tablet by mouth 2 (two) times daily as needed for mild constipation.   Yes Historical Provider, MD  meclizine (ANTIVERT) 12.5 MG tablet Take 12.5 mg by mouth 3 (three) times daily. At 0800, 1400, and 2000    Historical Provider, MD  Miconazole Nitrate 2 % AERP Apply topically. Apply topically to under breast and groin once a day 1500-2300    Historical Provider, MD  Multiple Vitamin (MULTIVITAMIN) tablet Take 1 tablet by mouth daily.    Historical Provider, MD  ondansetron (ZOFRAN) 4 MG tablet Take 4 mg by mouth every 4 (four) hours as needed for nausea or vomiting.    Historical Provider, MD      PHYSICAL EXAMINATION:   VITAL SIGNS: Blood pressure (!) 149/92, pulse 98, temperature 98.5 F (36.9 C), temperature source Oral, resp. rate (!) 22, height 5\' 2"  (1.575 m), weight 57.6 kg (127 lb), SpO2 94 %.  GENERAL:  83 y.o.-year-old patient lying in the bed with no acute distress.  EYES: Pupils equal, round, reactive to light and accommodation. No scleral icterus. Extraocular muscles intact.  HEENT: Head atraumatic, normocephalic. Oropharynx and nasopharynx clear.  NECK:  Supple, no jugular venous distention. No thyroid enlargement, no tenderness.  LUNGS: Normal breath sounds bilaterally, Mild wheezing, some crepitation. No use of accessory muscles of respiration. On supplemental oxygen use. CARDIOVASCULAR: S1, S2 normal. No murmurs, rubs, or gallops.  ABDOMEN: Soft, nontender, nondistended. Bowel sounds present. No organomegaly or mass.  EXTREMITIES: No pedal edema,  cyanosis, or clubbing.  NEUROLOGIC: Cranial nerves II through XII are intact. Muscle strength 4 /5 in all extremities. Sensation intact. Gait not checked.  PSYCHIATRIC: The patient is alert and oriented x 3.  SKIN: No obvious rash, lesion, or ulcer.   LABORATORY PANEL:   CBC  Recent Labs Lab 01/09/17 0850  WBC 16.2*  HGB 11.7*  HCT 35.3  PLT 233  MCV 91.0  MCH 30.2  MCHC 33.2  RDW 17.8*  LYMPHSABS 0.9*  MONOABS 0.6  EOSABS 0.1  BASOSABS 0.1   ------------------------------------------------------------------------------------------------------------------  Chemistries   Recent Labs Lab 01/09/17 0850  NA 137  K 4.6  CL 104  CO2 24  GLUCOSE 158*  BUN 23*  CREATININE 0.64  CALCIUM 8.5*   ------------------------------------------------------------------------------------------------------------------ estimated creatinine clearance is 41.4 mL/min (by C-G formula based on SCr of 0.64 mg/dL). ------------------------------------------------------------------------------------------------------------------ No results for input(s): TSH, T4TOTAL, T3FREE,  THYROIDAB in the last 72 hours.  Invalid input(s): FREET3   Coagulation profile  Recent Labs Lab 01/09/17 0850  INR 1.14   ------------------------------------------------------------------------------------------------------------------- No results for input(s): DDIMER in the last 72 hours. -------------------------------------------------------------------------------------------------------------------  Cardiac Enzymes  Recent Labs Lab 01/09/17 0850  TROPONINI <0.03   ------------------------------------------------------------------------------------------------------------------ Invalid input(s): POCBNP  ---------------------------------------------------------------------------------------------------------------  Urinalysis    Component Value Date/Time   COLORURINE AMBER (A) 01/03/2016 1455    APPEARANCEUR CLEAR (A) 01/03/2016 1455   APPEARANCEUR Clear 08/16/2014 1611   LABSPEC 1.021 01/03/2016 1455   LABSPEC 1.011 08/16/2014 1611   PHURINE 5.0 01/03/2016 1455   GLUCOSEU NEGATIVE 01/03/2016 1455   GLUCOSEU Negative 08/16/2014 1611   HGBUR 2+ (A) 01/03/2016 1455   BILIRUBINUR NEGATIVE 01/03/2016 1455   BILIRUBINUR Negative 08/16/2014 1611   KETONESUR NEGATIVE 01/03/2016 1455   PROTEINUR NEGATIVE 01/03/2016 1455   NITRITE POSITIVE (A) 01/03/2016 1455   LEUKOCYTESUR TRACE (A) 01/03/2016 1455   LEUKOCYTESUR Negative 08/16/2014 1611     RADIOLOGY: Dg Chest Port 1 View  Result Date: 01/09/2017 CLINICAL DATA:  Shortness of breath. EXAM: PORTABLE CHEST 1 VIEW COMPARISON:  12/04/2016. FINDINGS: Stable enlarged cardiac silhouette and prominence of the interstitial markings. Linear scarring in both lungs is again demonstrated. Interval small amount of patchy density in the left upper lung zone. Increased density in the left lower lobe. Diffuse osteopenia. IMPRESSION: 1. Interval small amount of patchy density in the left upper lung zone, suspicious for pneumonia. 2. Stable cardiomegaly and chronic interstitial lung disease with probable superimposed interstitial pulmonary edema. 3. Increased left lower lobe atelectasis or pneumonia. Electronically Signed   By: Claudie Revering M.D.   On: 01/09/2017 08:22    EKG: Orders placed or performed during the hospital encounter of 01/09/17  . ED EKG  . ED EKG    IMPRESSION AND PLAN:  * Acute on chronic hypoxic respiratory failure   Pneumonia    Continue supplemental oxygen via nasal cannula, try to taper.   Patient is allergic to penicillin I will give Levaquin and vancomycin IV.   Influenza is tested and are negative.   Check MRSA screen.   Get speech and swallow evaluation.   Incentive spirometry.  *  Atrial fibrillation   Continue digoxin, Cardizem- rate is controlled.  * Chronic diastolic CHF   Currently no signs of  exacerbation.  * Hypertension   Continue home medication, stable.   All the records are reviewed and case discussed with ED provider. Management plans discussed with the patient, family and they are in agreement.  CODE STATUS: Limited, portable advance directive paper his present which stays yes for CPR and medication but no for intubation or ventilation.  Code Status History    Date Active Date Inactive Code Status Order ID Comments User Context   12/04/2016  9:42 AM 12/07/2016 12:52 AM Full Code [id number]  Saundra Shelling, MD Inpatient   12/04/2016  6:46 AM 12/04/2016  9:41 AM Full Code [id number]  Saundra Shelling, MD ED    Advance Directive Documentation   Flowsheet Row Most Recent Value  Type of Advance Directive  Out of facility DNR (pink MOST or yellow form)  Pre-existing out of facility DNR order (yellow form or pink MOST form)  Pink MOST form placed in chart (order not valid for inpatient use)  "MOST" Form in Place?  No data       TOTAL TIME TAKING CARE OF THIS PATIENT: 50 minutes.    Vaughan Basta M.D on 01/09/2017  Between 7am to 6pm - Pager - [phone]  After 6pm go to [email or web address] - password [email or web address] [facility]  Office  [phone]  CC: Primary care physician; Juluis Pitch, MD   Note: This dictation was prepared with Dragon dictation along with smaller phrase technology. Any transcriptional errors that result from this process are unintentional.

## 2017-01-09 NOTE — ED Notes (Signed)
Pt placed on 02 at 12L due to hypoxia.

## 2017-01-09 NOTE — Progress Notes (Signed)
Placed patient on a 40% venturi mask. Sats are 95% at this time; Yong Channel, RN notified.

## 2017-01-09 NOTE — ED Provider Notes (Signed)
[facility] Emergency Department Provider Note  ____________________________________________   I have reviewed the triage vital signs and the nursing notes.   HISTORY  Chief Complaint Shortness of Breath    HPI Heather Barker is a 83 y.o. female with a history of A. fib, on 2 L of oxygen at home for presumed COPD, as well as a history of CHF presents today complaining of shortness of breath for 2-3 days. Very poor historian. Patient states that she has had some cough. She denies any chest pain or leg swelling. Initial sats were 65% on room air however on 5 L to 7 L they're in the mid 90s. ComLevel 5 chart caveat; no further history available due to patient status. She did receive Solu-Medrol breathing treatment en route, patient is from an assisted living facility     Past Medical History:  Diagnosis Date  . Atrial fibrillation ([place])   . Basal cell cancer 1990's   forehead  . CHF (congestive heart failure) ([place])   . Depression   . Hypertension   . Osteoarthritis   . Psychosis   . Rosacea   . Stenosis, cervical spine   . Urinary incontinence     Patient Active Problem List   Diagnosis Date Noted  . Aspiration pneumonia ([place]) 12/04/2016  . Pneumonia 12/04/2016  . Dementia with psychosis 01/02/2016  . Hypertension 01/02/2016  . OSTEOARTHRITIS 07/04/2008  . LEG PAIN 09/28/2007  . DEPRESSION 07/20/2007  . HYPERTENSION 07/20/2007  . ROSACEA 07/20/2007  . SPINAL STENOSIS, CERVICAL 07/20/2007  . URINARY INCONTINENCE 07/20/2007    Past Surgical History:  Procedure Laterality Date  . APPENDECTOMY    . CATARACT EXTRACTION     left eye  . CATARACT EXTRACTION  07/2010   right, IOL  . DILATION AND CURETTAGE OF UTERUS  1970's  . KNEE ARTHROSCOPY  1996   right   . ROTATOR CUFF REPAIR     left torn    Prior to Admission medications   Medication Sig Start Date End Date Taking? Authorizing Provider  acetaminophen (TYLENOL) 500 MG tablet  Take 500 mg by mouth 2 (two) times daily.     Historical Provider, MD  aspirin EC 81 MG tablet Take 81 mg by mouth daily.    Historical Provider, MD  digoxin (LANOXIN) 0.125 MG tablet Take 0.125 mg by mouth daily.    Historical Provider, MD  diltiazem (CARDIZEM CD) 240 MG 24 hr capsule Take 1 capsule (240 mg total) by mouth daily. 12/07/16   Vaughan Basta, MD  lidocaine (LIDODERM) 5 % Place 1 patch onto the skin every 12 (twelve) hours. Remove & Discard patch within 12 hours or as directed by MD    Historical Provider, MD  meclizine (ANTIVERT) 12.5 MG tablet Take 12.5 mg by mouth 3 (three) times daily. At 0800, 1400, and 2000    Historical Provider, MD  Menthol, Topical Analgesic, (BIOFREEZE) 4 % GEL Apply 1 application topically 2 (two) times daily.    Historical Provider, MD  metoprolol tartrate (LOPRESSOR) 25 MG tablet Take 0.5 tablets (12.5 mg total) by mouth 2 (two) times daily. 12/06/16   Vaughan Basta, MD  metroNIDAZOLE (METROGEL) 0.75 % gel Apply 1 application topically 2 (two) times daily as needed.     Historical Provider, MD  Miconazole Nitrate 2 % AERP Apply topically. Apply topically to under breast and groin once a day 1500-2300    Historical Provider, MD  Multiple Vitamin (MULTIVITAMIN) tablet Take 1 tablet by  mouth daily.    Historical Provider, MD  ondansetron (ZOFRAN) 4 MG tablet Take 4 mg by mouth every 4 (four) hours as needed for nausea or vomiting.    Historical Provider, MD  QUEtiapine (SEROQUEL) 25 MG tablet Take 1 tablet (25 mg total) by mouth 2 (two) times daily. 12/06/16   Vaughan Basta, MD  senna-docusate (SENOKOT-S) 8.6-50 MG tablet Take 1 tablet by mouth 2 (two) times daily as needed for mild constipation.    Historical Provider, MD    Allergies Codeine phosphate; Meperidine hcl; and Penicillins  Family History  Problem Relation Age of Onset  . Cancer Mother     lung  . Hypertension Mother   . Heart disease Father   . Hypertension Father    . Diabetes Neg Hx     Social History Social History  Substance Use Topics  . Smoking status: Never Smoker  . Smokeless tobacco: Never Used  . Alcohol use Yes     Comment: occasional    Review of Systems Constitutional: No fever/chills Eyes: No visual changes. ENT: No sore throat. No stiff neck no neck pain Cardiovascular: Denies chest pain. Respiratory: Positive shortness of breath. Gastrointestinal:   no vomiting.  No diarrhea.  No constipation. Genitourinary: Negative for dysuria. Musculoskeletal: Negative lower extremity swelling Skin: Negative for rash. Neurological: Negative for severe headaches, focal weakness or numbness. 10-point ROS otherwise negative.  ____________________________________________   PHYSICAL EXAM:  VITAL SIGNS: ED Triage Vitals  Enc Vitals Group     BP 01/09/17 0727 (!) 157/80     Pulse Rate 01/09/17 0727 88     Resp 01/09/17 0727 (!) 22     Temp 01/09/17 0727 98.5 F (36.9 C)     Temp Source 01/09/17 0727 Oral     SpO2 01/09/17 0727 (!) 65 %     Weight 01/09/17 0729 127 lb (57.6 kg)     Height 01/09/17 0729 5\' 2"  (1.575 m)     Head Circumference --      Peak Flow --      Pain Score --      Pain Loc --      Pain Edu? --      Excl. in [place]? --     Constitutional: Alert and orientedTo name and place unsure of the date. Well appearing and in no acute distress. Eyes: Conjunctivae are normal. PERRL. EOMI. Head: Atraumatic. Nose: No congestion/rhinnorhea. Mouth/Throat: Mucous membranes are moist.  Oropharynx non-erythematous. Neck: No stridor.   Nontender with no meningismus Cardiovascular: Normal rate, regular rhythm. Grossly normal heart sounds.  Good peripheral circulation. Respiratory: Normal respiratory effort.  No retractions.Very diminished in the bases with occasional basilar rhonchi no wheeze noted Abdominal: Soft and nontender. No distention. No guarding no rebound Back:  There is no focal tenderness or step off.  there is no  midline tenderness there are no lesions noted. there is no CVA tenderness Musculoskeletal: No lower extremity tenderness, no upper extremity tenderness. No joint effusions, no DVT signs strong distal pulses no edema Neurologic:  Normal speech and language. No gross focal neurologic deficits are appreciated.  Skin:  Skin is warm, dry and intact. No rash noted. Psychiatric: Mood and affect are normal. Speech and behavior are normal.  ____________________________________________   LABS (all labs ordered are listed, but only abnormal results are displayed)  Labs Reviewed  CULTURE, BLOOD (ROUTINE X 2)  CULTURE, BLOOD (ROUTINE X 2)  INFLUENZA PANEL BY PCR (TYPE A & B, H1N1)  DIGOXIN LEVEL  CBC WITH DIFFERENTIAL/PLATELET  TROPONIN I  BRAIN NATRIURETIC PEPTIDE  BASIC METABOLIC PANEL  PROTIME-INR   ____________________________________________  EKG  I personally interpreted any EKGs ordered by me or triage Atrial fibrillation rate 97 bpm no acute ST elevation or depression normal axis nonspecific ST changes noted ____________________________________________  RADIOLOGY  I reviewed any imaging ordered by me or triage that were performed during my shift and, if possible, patient and/or family made aware of any abnormal findings. ____________________________________________   PROCEDURES  Procedure(s) performed: None  Procedures  Critical Care performed: None  ____________________________________________   INITIAL IMPRESSION / ASSESSMENT AND PLAN / ED COURSE  Pertinent labs & imaging results that were available during my care of the patient were reviewed by me and considered in my medical decision making (see chart for details). Some of the history both of CHF as well as pneumonia presents today with lower sensitive baseline and cough. Most likely pneumonia. We'll obtain cultures chest x-ray and routine workup. Also obtain additional troponin and CHF labs and we will reassess.  Patient in no acute distress at this time options saturations as noted were initially low but have come up with supplemental oxygen. Require admission.   Clinical Course    ____________________________________________   FINAL CLINICAL IMPRESSION(S) / ED DIAGNOSES  Final diagnoses:  SOB (shortness of breath)      This chart was dictated using voice recognition software.  Despite best efforts to proofread,  errors can occur which can change meaning.      Schuyler Amor, MD 01/09/17 [phone]

## 2017-01-09 NOTE — Progress Notes (Signed)
Pharmacy Antibiotic Note  Heather Barker is a 83 y.o. female admitted on 01/09/2017 with pneumonia.  Pharmacy has been consulted for levofloxacin and vancomycin dosing.  Plan: Levofloxacin 750 mg PO q48h  Vancomycin 1000 mg dose given today. Will order vancomycin 1000 mg IV daily (9 hour stacked dose) Goal vancomycin trough 15-20 mcg/mL Vancomycin trough 1/17 @ 2230  Kinetics: Weight = 57 kg Ke: 0.046 Half-life: 15 hrs Vd: 39 L Cmin (estimated) ~16 mcg/mL  MRSA PCR and PCT ordered  Height: 5\' 2"  (157.5 cm) Weight: 127 lb (57.6 kg) IBW/kg (Calculated) : 50.1  Temp (24hrs), Avg:98.3 F (36.8 C), Min:98 F (36.7 C), Max:98.5 F (36.9 C)   Recent Labs Lab 01/09/17 0850  WBC 16.2*  CREATININE 0.64    Estimated Creatinine Clearance: 41.4 mL/min (by C-G formula based on SCr of 0.64 mg/dL).    Allergies  Allergen Reactions  . Codeine Phosphate     REACTION: intolerance--hydrocodone okay  . Meperidine Hcl     REACTION: unspecified  . Penicillins    Antimicrobials this admission: levofloxacin 1/14 >>  vancomycin 1/14 >>   Dose adjustments this admission:  Microbiology results: 1/14 BCx: Sent 1/14 MRSA PCR: Positive  Thank you for allowing pharmacy to be a part of this patient's care.  Lenis Noon, PharmD, BCPS Clinical Pharmacist 01/09/2017 2:38 PM

## 2017-01-09 NOTE — ED Notes (Signed)
Pt placed on venturi mask but unable to tolerate due to increased WOB per pt report. Placed on 6L 02 with saturation 97%.

## 2017-01-09 NOTE — ED Triage Notes (Signed)
Pt from [facility] via EMS c/o SOB x2 days per pt report. Pt reports cough. Denies chest pain.

## 2017-01-09 NOTE — Progress Notes (Signed)
PHARMACIST - PHYSICIAN ORDER COMMUNICATION  CONCERNING: P&T Medication Policy on Herbal Medications  DESCRIPTION:  This patient's order for:  Biotin 10 mg  has been noted.  This product(s) is classified as an "herbal" or natural product. Due to a lack of definitive safety studies or FDA approval, nonstandard manufacturing practices, plus the potential risk of unknown drug-drug interactions while on inpatient medications, the Pharmacy and Therapeutics Committee does not permit the use of "herbal" or natural products of this type within [facility].   ACTION TAKEN: The pharmacy department is unable to verify this order at this time and your patient has been informed of this safety policy. Please reevaluate patient's clinical condition at discharge and address if the herbal or natural product(s) should be resumed at that time.   Darrow Bussing, PharmD Pharmacy Resident 01/09/2017 12:36 PM

## 2017-01-09 NOTE — ED Notes (Signed)
Pt on 8L 02 via [facility] with saturation 88-92%. Cyanotic lips noted. Tachypnea. POA at bedside.

## 2017-01-09 NOTE — Clinical Social Work Note (Signed)
CSW received consult that patient was admitted from facility. CSW is following.  Santiago Bumpers, MSW, Latanya Presser [phone]

## 2017-01-09 NOTE — ED Notes (Signed)
[place] MD at bedside during triage.

## 2017-01-09 NOTE — Progress Notes (Signed)
Patient admitted to unit. Oriented to room, call bell, and staff. Bed in lowest position. Fall safety plan reviewed. Full assessment to Epic. Skin assessment verified with Casey Burkitt. Telemetry box verification with tele clerk and [place] NT- Box#: [id number]. Will continue to monitor.

## 2017-01-10 LAB — BASIC METABOLIC PANEL
ANION GAP: 11 (ref 5–15)
BUN: 20 mg/dL (ref 6–20)
CALCIUM: 9 mg/dL (ref 8.9–10.3)
CO2: 24 mmol/L (ref 22–32)
Chloride: 104 mmol/L (ref 101–111)
Creatinine, Ser: 0.63 mg/dL (ref 0.44–1.00)
GLUCOSE: 138 mg/dL — AB (ref 65–99)
POTASSIUM: 3.9 mmol/L (ref 3.5–5.1)
Sodium: 139 mmol/L (ref 135–145)

## 2017-01-10 LAB — CBC
HEMATOCRIT: 35.4 % (ref 35.0–47.0)
HEMOGLOBIN: 12 g/dL (ref 12.0–16.0)
MCH: 30.9 pg (ref 26.0–34.0)
MCHC: 33.8 g/dL (ref 32.0–36.0)
MCV: 91.5 fL (ref 80.0–100.0)
Platelets: 209 10*3/uL (ref 150–440)
RBC: 3.87 MIL/uL (ref 3.80–5.20)
RDW: 17.6 % — ABNORMAL HIGH (ref 11.5–14.5)
WBC: 16.7 10*3/uL — ABNORMAL HIGH (ref 3.6–11.0)

## 2017-01-10 MED ORDER — VANCOMYCIN HCL IN DEXTROSE 750-5 MG/150ML-% IV SOLN
750.0000 mg | Freq: Once | INTRAVENOUS | Status: AC
  Administered 2017-01-10: 750 mg via INTRAVENOUS
  Filled 2017-01-10: qty 150

## 2017-01-10 MED ORDER — IPRATROPIUM-ALBUTEROL 0.5-2.5 (3) MG/3ML IN SOLN
3.0000 mL | Freq: Three times a day (TID) | RESPIRATORY_TRACT | Status: DC
  Administered 2017-01-10 – 2017-01-11 (×3): 3 mL via RESPIRATORY_TRACT
  Filled 2017-01-10 (×5): qty 3

## 2017-01-10 MED ORDER — VANCOMYCIN HCL IN DEXTROSE 750-5 MG/150ML-% IV SOLN
750.0000 mg | INTRAVENOUS | Status: DC
  Administered 2017-01-11: 750 mg via INTRAVENOUS
  Filled 2017-01-10 (×2): qty 150

## 2017-01-10 NOTE — Care Management Important Message (Signed)
Important Message  Patient Details  Name: Heather Barker MRN: [id number] Date of Birth: [birth year]   Medicare Important Message Given:  Yes  Initial signed IM printed from Epic and given to patient.     Katrina Stack, RN 01/10/2017, 3:10 PM

## 2017-01-10 NOTE — Care Management (Signed)
Patient is from [facility].  Clinical social work is aware.

## 2017-01-10 NOTE — Progress Notes (Signed)
Pharmacy Antibiotic Note  Heather Barker is a 83 y.o. female admitted on 01/09/2017 with pneumonia.  Pharmacy has been consulted for Vancomycin dosing.  Plan: Vancomycin 750mg  IV every 24 hours.  Goal trough 15-20 mcg/mL.  Trough ordered for 1/19 at 01:30.  Height: 5\' 2"  (157.5 cm) Weight: 127 lb (57.6 kg) IBW/kg (Calculated) : 50.1  Temp (24hrs), Avg:97.6 F (36.4 C), Min:97.4 F (36.3 C), Max:98.1 F (36.7 C)   Recent Labs Lab 01/09/17 0850 01/10/17 0627  WBC 16.2* 16.7*  CREATININE 0.64 0.63    Estimated Creatinine Clearance: 41.4 mL/min (by C-G formula based on SCr of 0.63 mg/dL).    Allergies  Allergen Reactions  . Codeine Phosphate     REACTION: intolerance--hydrocodone okay  . Meperidine Hcl     REACTION: unspecified  . Penicillins     Antimicrobials this admission: Vancomycin 1/14 >>  Levofloxacin 1/14 >>   Dose adjustments this admission: Vancomycin 1g x 2 doses, now 750mg .  Microbiology results: 1/14 BCx: NG x2 1/14 MRSA PCR: positive  Thank you for allowing pharmacy to be a part of this patient's care.  Olivia Canter, [place] Clinical Pharmacist 01/10/2017 6:48 PM

## 2017-01-10 NOTE — Progress Notes (Signed)
[facility] [place] NAME: Heather Barker    MR#:  [id number]  DATE OF BIRTH:  29-Sep-1930  SUBJECTIVE:  CHIEF COMPLAINT:  Patient is reporting feeling better. Cough and shortness of breath is better  REVIEW OF SYSTEMS:  CONSTITUTIONAL: No fever, fatigue or weakness.  EYES: No blurred or double vision.  EARS, NOSE, AND THROAT: No tinnitus or ear pain.  RESPIRATORY: Has some cough, shortness of breath, denies wheezing or hemoptysis.  CARDIOVASCULAR: No chest pain, orthopnea, edema.  GASTROINTESTINAL: No nausea, vomiting, diarrhea or abdominal pain.  GENITOURINARY: No dysuria, hematuria.  ENDOCRINE: No polyuria, nocturia,  HEMATOLOGY: No anemia, easy bruising or bleeding SKIN: No rash or lesion. MUSCULOSKELETAL: No joint pain or arthritis.   NEUROLOGIC: No tingling, numbness, weakness.  PSYCHIATRY: No anxiety or depression.   DRUG ALLERGIES:   Allergies  Allergen Reactions  . Codeine Phosphate     REACTION: intolerance--hydrocodone okay  . Meperidine Hcl     REACTION: unspecified  . Penicillins     VITALS:  Blood pressure 121/61, pulse 73, temperature 97.5 F (36.4 C), temperature source Oral, resp. rate 12, height 5\' 2"  (1.575 m), weight 57.6 kg (127 lb), SpO2 97 %.  PHYSICAL EXAMINATION:  GENERAL:  83 y.o.-year-old patient lying in the bed with no acute distress.  EYES: Pupils equal, round, reactive to light and accommodation. No scleral icterus. Extraocular muscles intact.  HEENT: Head atraumatic, normocephalic. Oropharynx and nasopharynx clear.  NECK:  Supple, no jugular venous distention. No thyroid enlargement, no tenderness.  LUNGS: Moderate breath sounds bilaterally, no wheezing, rales,rhonchi. positive crepitation. No use of accessory muscles of respiration.  CARDIOVASCULAR: S1, S2 normal. No murmurs, rubs, or gallops.  ABDOMEN: Soft, nontender, nondistended. Bowel sounds present. No organomegaly or mass.   EXTREMITIES: No pedal edema, cyanosis, or clubbing.  NEUROLOGIC: Cranial nerves II through XII are intact. Muscle strength 5/5 in all extremities. Sensation intact. Gait not checked.  PSYCHIATRIC: The patient is alert and oriented x 2 SKIN: No obvious rash, lesion, or ulcer.    LABORATORY PANEL:   CBC  Recent Labs Lab 01/10/17 0627  WBC 16.7*  HGB 12.0  HCT 35.4  PLT 209   ------------------------------------------------------------------------------------------------------------------  Chemistries   Recent Labs Lab 01/10/17 0627  NA 139  K 3.9  CL 104  CO2 24  GLUCOSE 138*  BUN 20  CREATININE 0.63  CALCIUM 9.0   ------------------------------------------------------------------------------------------------------------------  Cardiac Enzymes  Recent Labs Lab 01/09/17 0850  TROPONINI <0.03   ------------------------------------------------------------------------------------------------------------------  RADIOLOGY:  Dg Chest Port 1 View  Result Date: 01/09/2017 CLINICAL DATA:  Shortness of breath. EXAM: PORTABLE CHEST 1 VIEW COMPARISON:  12/04/2016. FINDINGS: Stable enlarged cardiac silhouette and prominence of the interstitial markings. Linear scarring in both lungs is again demonstrated. Interval small amount of patchy density in the left upper lung zone. Increased density in the left lower lobe. Diffuse osteopenia. IMPRESSION: 1. Interval small amount of patchy density in the left upper lung zone, suspicious for pneumonia. 2. Stable cardiomegaly and chronic interstitial lung disease with probable superimposed interstitial pulmonary edema. 3. Increased left lower lobe atelectasis or pneumonia. Electronically Signed   By: Claudie Revering M.D.   On: 01/09/2017 08:22    EKG:   Orders placed or performed during the hospital encounter of 01/09/17  . ED EKG  . ED EKG    ASSESSMENT AND PLAN:   * Acute on chronic hypoxic respiratory failure 2/  2Pneumonia Continue supplemental oxygen via nasal cannula,  try to Wean off to room air   continue levofloxacin and vancomycin  IV.   Influenza is tested and are negative.   positive MRSA screen    Get speech and swallow evaluation.-Recommending regular diet and thin liquids    Incentive spirometry.  *  Atrial fibrillation   Continue digoxin, Cardizem- rate is controlled.  * Chronic diastolic CHF   Currently no signs of exacerbation.  * Hypertension   Continue home medication, stable.   PT eval   All the records are reviewed and case discussed with Care Management/Social Workerr. Management plans discussed with the patient, family and they are in agreement.  CODE STATUS: partial code  TOTAL TIME TAKING CARE OF THIS PATIENT: 36 minutes.   POSSIBLE D/C IN 2 DAYS, DEPENDING ON CLINICAL CONDITION.  Note: This dictation was prepared with Dragon dictation along with smaller phrase technology. Any transcriptional errors that result from this process are unintentional.   Nicholes Mango M.D on 01/10/2017 at 6:07 PM  Between 7am to 6pm - Pager - [phone] After 6pm go to [email or web address] - password [email or web address] [facility]  Office  [phone]  CC: Primary care physician; Juluis Pitch, MD

## 2017-01-10 NOTE — Care Management Important Message (Signed)
Important Message  Patient Details  Name: SHAUNIKA RICHBERG MRN: [id number] Date of Birth: 04-Aug-1930   Medicare Important Message Given:  Yes  Initial signed IM printed from Epic and given to patient.     Katrina Stack, RN 01/10/2017, 10:56 AM

## 2017-01-10 NOTE — Evaluation (Signed)
Clinical/Bedside Swallow Evaluation Patient Details  Name: Heather Barker MRN: [id number] Date of Birth: 1930-09-21  Today's Date: 01/10/2017 Time: SLP Start Time (ACUTE ONLY): 1015 SLP Stop Time (ACUTE ONLY): 1115 SLP Time Calculation (min) (ACUTE ONLY): 60 min  Past Medical History:  Past Medical History:  Diagnosis Date  . Atrial fibrillation ([place])   . Basal cell cancer 1990's   forehead  . CHF (congestive heart failure) ([place])   . Depression   . Hypertension   . Osteoarthritis   . Psychosis   . Rosacea   . Stenosis, cervical spine   . Urinary incontinence    Past Surgical History:  Past Surgical History:  Procedure Laterality Date  . APPENDECTOMY    . CATARACT EXTRACTION     left eye  . CATARACT EXTRACTION  07/2010   right, IOL  . DILATION AND CURETTAGE OF UTERUS  1970's  . KNEE ARTHROSCOPY  1996   right   . ROTATOR CUFF REPAIR     left torn   HPI:  Pt is a 83 y.o. female with a known history of Atrial fibrillation, basal cell carcinoma, CHF, depression, hypertension, osteoarthritis, urinary incontinence- was treated for pneumonia last month in hospital and sent to the rehabilitation center back. She was at her baseline using 2 L of oxygen as needed basis mainly on the nighttime. She started having some cough and chills for last 1-2 days gradually getting worse. Also requiring more oxygen and so she was little more anxious today. Concerned with her hypoxia she was sent back to hospital from a nursing home. Pt oriented to self but unsure of pts baseline of cognitive status as she apperared to present with mild-mod confusion during interactions.    Assessment / Plan / Recommendation Clinical Impression  Pt at reduced risk for apiration at the time of this eval. Pt did not present with any overt s/s of aspiration or penetration with trials of thin liquids and semi solids. SLP dicussed and explained general aspiration precautions and patient was able to recall with cues  (dicussed eating enviornment in NH, and eating precautions). Briefly dicussed and explained use of swallowing pills with puree, oral care using swab kit, and use of incentive spirameter to aide strengthing pulmonary status for swallowing and speech. No further skilled st services indicated at this time. Nursing to reconsult if any change of staus while admitted.     Aspiration Risk   (reduced )    Diet Recommendation   Regular (meats cut as needed), thin liquids. General aspiration precautions, set up assist at meals, reduce distractions at meals.   Medication Administration: Whole meds with liquid (with a puree if necessary )    Other  Recommendations Recommended Consults:  (dietician ) Oral Care Recommendations: Patient independent with oral care;Oral care BID (assist as necessary )   Follow up Recommendations None      Frequency and Duration            Prognosis Prognosis for Safe Diet Advancement: Good Barriers to Reach Goals: Cognitive deficits      Swallow Study   General Date of Onset: 01/09/17 HPI: Pt is a 83 y.o. female with a known history of Atrial fibrillation, basal cell carcinoma, CHF, depression, hypertension, osteoarthritis, urinary incontinence- was treated for pneumonia last month in hospital and sent to the rehabilitation center back. She was at her baseline using 2 L of oxygen as needed basis mainly on the nighttime. She started having some cough and chills for last  1-2 days gradually getting worse. Also requiring more oxygen and so she was little more anxious today. Concerned with her hypoxia she was sent back to hospital from a nursing home. Pt oriented to self but unsure of pts baseline of cognitive status as she apperared to present with mild-mod confusion during interactions.  Type of Study: Bedside Swallow Evaluation Previous Swallow Assessment: none written Diet Prior to this Study: Regular;Thin liquids Temperature Spikes Noted: No Respiratory Status: Nasal  cannula History of Recent Intubation: No Behavior/Cognition: Alert;Pleasant mood;Distractible;Requires cueing Oral Cavity Assessment: Within Functional Limits Oral Care Completed by SLP: No Oral Cavity - Dentition: Dentures, top;Dentures, bottom Vision: Functional for self-feeding Self-Feeding Abilities: Able to feed self;Needs set up Patient Positioning: Upright in bed Baseline Vocal Quality: Normal Volitional Cough:  (dnt) Volitional Swallow:  (dnt)    Oral/Motor/Sensory Function Overall Oral Motor/Sensory Function: Within functional limits   Ice Chips Ice chips: Not tested   Thin Liquid Thin Liquid: Within functional limits Presentation: Cup;Straw;Self Fed (X3 trails- first with cup, then with straw)    Nectar Thick Nectar Thick Liquid: Not tested   Honey Thick Honey Thick Liquid: Not tested   Puree Puree: Within functional limits Presentation: Self Fed;Spoon (X3 trials)   Solid   GO   Solid: Not tested Other Comments: pt consumed breakfast tray eating the majoirty of items and denied any problem with swallowing         Eulogio Ditch, B.S. Graduate Clinician  01/10/2017,11:40 AM   This information has been reviewed and agreed upon by this supervising clinician.  Orinda Kenner, [place], CCC-SLP

## 2017-01-11 LAB — BASIC METABOLIC PANEL WITH GFR
Anion gap: 8 (ref 5–15)
BUN: 24 mg/dL — ABNORMAL HIGH (ref 6–20)
CO2: 25 mmol/L (ref 22–32)
Calcium: 8.8 mg/dL — ABNORMAL LOW (ref 8.9–10.3)
Chloride: 106 mmol/L (ref 101–111)
Creatinine, Ser: 0.63 mg/dL (ref 0.44–1.00)
GFR calc Af Amer: >60 mL/min
GFR calc non Af Amer: >60 mL/min
Glucose, Bld: 87 mg/dL (ref 65–99)
Potassium: 3.9 mmol/L (ref 3.5–5.1)
Sodium: 139 mmol/L (ref 135–145)

## 2017-01-11 LAB — CBC
HCT: 31.6 % — ABNORMAL LOW (ref 35.0–47.0)
HEMOGLOBIN: 10.4 g/dL — AB (ref 12.0–16.0)
MCH: 30.2 pg (ref 26.0–34.0)
MCHC: 33 g/dL (ref 32.0–36.0)
MCV: 91.6 fL (ref 80.0–100.0)
PLATELETS: 204 10*3/uL (ref 150–440)
RBC: 3.45 MIL/uL — AB (ref 3.80–5.20)
RDW: 17.5 % — ABNORMAL HIGH (ref 11.5–14.5)
WBC: 12.9 10*3/uL — ABNORMAL HIGH (ref 3.6–11.0)

## 2017-01-11 LAB — PROCALCITONIN: Procalcitonin: 0.19 ng/mL

## 2017-01-11 MED ORDER — DOXYCYCLINE HYCLATE 50 MG PO CAPS: 100.0000 mg | ORAL_CAPSULE | Freq: Two times a day (BID) | ORAL | 0 refills | Status: AC

## 2017-01-11 MED ORDER — IPRATROPIUM-ALBUTEROL 0.5-2.5 (3) MG/3ML IN SOLN: 3.0000 mL | Freq: Two times a day (BID) | RESPIRATORY_TRACT | Status: DC

## 2017-01-11 MED ORDER — IPRATROPIUM-ALBUTEROL 0.5-2.5 (3) MG/3ML IN SOLN: 3.0000 mL | Freq: Four times a day (QID) | RESPIRATORY_TRACT | 0 refills | Status: AC | PRN

## 2017-01-11 MED ORDER — MUPIROCIN 2 % EX OINT: 1.0000 "application " | TOPICAL_OINTMENT | Freq: Two times a day (BID) | CUTANEOUS | 0 refills | Status: AC

## 2017-01-11 NOTE — Clinical Social Work Note (Signed)
Clinical Social Work Assessment  Patient Details  Name: Heather Barker MRN: [id number] Date of Birth: [birth year]  Date of referral:  01/11/17               Reason for consult:  Facility Placement                Permission sought to share information with:  Facility Sport and exercise psychologist, Family Supports Permission granted to share information::  Yes, Verbal Permission Granted  Name::     Deloatch,Evelyn Other [phone]  [phone]   Agency::  [facility]  Relationship::     Contact Information:     Housing/Transportation Living arrangements for the past 2 months:  [place] of Information:  Patient Patient Interpreter Needed:  None Criminal Activity/Legal Involvement Pertinent to Current Situation/Hospitalization:  No - Comment as needed Significant Relationships:  Other Family Members, Friend Lives with:  Facility Resident Do you feel safe going back to the place where you live?  Yes Need for family participation in patient care:  No ([place])  Care giving concerns:  Patient does not express any concerns about returning to SNF.   Social Worker assessment / plan:  Patient is pleasant and alert and oriented x4.  Patient states she has been at [facility] for awhile, she was [facility] at [facility], but [facility].  Patient states she has been pleased with the care at [facility] and does not have any hesitations about returning back to SNF.  Patient did not have any questions or concerns about returning back to SNF.   Employment status:  Retired Forensic scientist:  Medicare PT Recommendations:  [place] / Referral to community resources:  [place]  Patient/Family's Response to care:  Patient is in agreement to returning back to SNF.  Patient/Family's Understanding of and Emotional Response to Diagnosis, Current Treatment, and Prognosis:  Patient is aware of current  treatment plan and diagnosis.  Emotional Assessment Appearance:  Appears stated age Attitude/Demeanor/Rapport:    Affect (typically observed):  Appropriate, Stable, Pleasant Orientation:  Oriented to  Time, Oriented to Place, Oriented to Self, Oriented to Situation Alcohol / Substance use:  Not Applicable Psych involvement (Current and /or in the community):  No (Comment)  Discharge Needs  Concerns to be addressed:  No discharge needs identified Readmission within the last 30 days:  No Current discharge risk:  None Barriers to Discharge:  No Barriers Identified   Ross Ludwig, LCSWA 01/11/2017, 4:59 PM

## 2017-01-11 NOTE — Progress Notes (Signed)
Report called to nicole at [facility], patients status gone over with rn. Patient to be discharged on o2 at 2l. Ems called for transport.

## 2017-01-11 NOTE — Clinical Social Work Note (Addendum)
Patient to be d/c'ed today to [facility] [place] where patient is a long term care resident.  Patient and family agreeable to plans will transport via ems RN to call report to [phone].  Patient's relative Estill Bamberg was informed that patient will be discharging today to SNF.  Evette Cristal, MSW, [place]

## 2017-01-11 NOTE — Evaluation (Signed)
Physical Therapy Evaluation Patient Details Name: Heather Barker MRN: [id number] DOB: 1930/02/12 Today's Date: 01/11/2017   History of Present Illness  Pt is an 83 y.o. female presenting to hospital with SOB x2-3 days and admitted with acute on chronic hypoxic respiratory failure secondary to PNA; a-fib.  PMH includes a-fib, 2 L home O2, CHF, htn, dementia, urinary incontinence, cervical spinal stenosis, and L RCR.  Clinical Impression  Prior to hospital admission, pt reports having assist for bed mobility and transfers to w/c (pt reports not walking for past few months).  Pt lives at [place] and reports having assist for "everything".  Currently pt is mod assist supine to sit and mod to max assist stand pivot bed to w/c (B knees blocked for safety).  Pt's HR 116 bpm at rest and increased to 128-138 bpm with transfer to chair (nursing present and aware); HR decreased with sitting rest in chair.  Pt would benefit from skilled PT to address noted impairments and functional limitations.  Recommend pt discharge back to ALF with HHPT when medically appropriate.    Follow Up Recommendations Home health PT;Supervision/Assistance - 24 hour    Equipment Recommendations  None recommended by PT (pt already owns manual w/c)    Recommendations for Other Services       Precautions / Restrictions Precautions Precautions: Fall Precaution Comments: Aspiration Restrictions Weight Bearing Restrictions: No      Mobility  Bed Mobility Overal bed mobility: Needs Assistance Bed Mobility: Supine to Sit     Supine to sit: Mod assist;HOB elevated     General bed mobility comments: assist for B LE's and trunk; assist to scoot to edge of bed  Transfers Overall transfer level: Needs assistance Equipment used: None Transfers: Sit to/from [facility] Sit to Stand: Mod assist (pt pushing up with R UE on side rail) Stand pivot transfers: Mod assist;Max assist       General  transfer comment: vc's for hand placement stand pivot bed to recliner  Ambulation/Gait             General Gait Details: Deferred d/t pt reporting not walking for a few months.  Stairs            Wheelchair Mobility    Modified Rankin (Stroke Patients Only)       Balance Overall balance assessment: Needs assistance Sitting-balance support: Bilateral upper extremity supported;Feet supported Sitting balance-Leahy Scale: Fair     Standing balance support: Single extremity supported (on bed rail) Standing balance-Leahy Scale: Poor Standing balance comment: posterior lean noted                             Pertinent Vitals/Pain Pain Assessment: No/denies pain  O2 93% or greater on 2 L O2 via nasal cannula during session.    Home Living Family/patient expects to be discharged to:: Assisted living               Home Equipment: Wheelchair - manual Additional Comments: [facility]    Prior Function Level of Independence: Needs assistance   Gait / Transfers Assistance Needed: Pt reports she needs assist with bed mobility and transfers (pivots to w/c without AD); reports she has not walked in past few months (prior to that was walking short distances with RW); propels self in w/c  ADL's / Homemaking Assistance Needed: assist with baths, medications, dressing; meals brought to pt's room  Comments: Pt denies any  falls in past 6 months.     Hand Dominance        Extremity/Trunk Assessment   Upper Extremity Assessment Upper Extremity Assessment: Generalized weakness    Lower Extremity Assessment Lower Extremity Assessment: Generalized weakness       Communication   Communication: No difficulties  Cognition Arousal/Alertness: Awake/alert Behavior During Therapy: WFL for tasks assessed/performed Overall Cognitive Status: No family/caregiver present to determine baseline cognitive functioning (Oriented to person)                       General Comments   Nursing cleared pt for participation in physical therapy.  Pt agreeable to PT session.    Exercises     Assessment/Plan    PT Assessment Patient needs continued PT services  PT Problem List Decreased strength;Decreased activity tolerance;Decreased balance;Decreased mobility          PT Treatment Interventions DME instruction;Functional mobility training;Therapeutic activities;Therapeutic exercise;Balance training;Patient/family education    PT Goals (Current goals can be found in the Care Plan section)  Acute Rehab PT Goals Patient Stated Goal: to get stronger PT Goal Formulation: With patient Time For Goal Achievement: 01/25/17 Potential to Achieve Goals: Fair    Frequency Min 2X/week   Barriers to discharge        Co-evaluation               End of Session Equipment Utilized During Treatment: Gait belt Activity Tolerance: Patient limited by fatigue;Other (comment) (Limited d/t HR elevated with activity) Patient left: in chair;with call bell/phone within reach;with chair alarm set;with nursing/sitter in room Nurse Communication: Mobility status;Precautions (HR elevated with activity)         Time: [id number] PT Time Calculation (min) (ACUTE ONLY): 25 min   Charges:   PT Evaluation $PT Eval Low Complexity: 1 Procedure PT Treatments $Therapeutic Activity: 8-22 mins   PT G CodesLeitha Bleak 2017/01/20, 11:02 AM Leitha Bleak, [place]

## 2017-01-11 NOTE — Progress Notes (Signed)
Ems on the floor to transport patient, packet given to ems. Patient transported on 02 at 2l. No distress noted. Iv and telemetry removed

## 2017-01-11 NOTE — NC FL2 (Signed)
[place] LEVEL OF CARE SCREENING TOOL     IDENTIFICATION  Patient Name: Heather Barker Birthdate: 10-18-1930 Sex: female Admission Date (Current Location): 01/09/2017  [place] and [place] Number:  Selena Lesser [id number] [place] and Address:  [facility], [address], [place], [place] [address]      Provider Number: [id number]  Attending Physician Name and Address:  Nicholes Mango, MD  Relative Name and Phone Number:  Deloatch,Evelyn Other [phone]  [phone]     Current Level of Care: Hospital Recommended Level of Care: [place] Prior Approval Number:    Date Approved/Denied:   PASRR Number: [id number]  Discharge Plan: SNF    Current Diagnoses: Patient Active Problem List   Diagnosis Date Noted  . Acute on chronic respiratory failure ([place]) 01/09/2017  . Aspiration pneumonia ([place]) 12/04/2016  . Pneumonia 12/04/2016  . Dementia with psychosis 01/02/2016  . Hypertension 01/02/2016  . OSTEOARTHRITIS 07/04/2008  . LEG PAIN 09/28/2007  . DEPRESSION 07/20/2007  . HYPERTENSION 07/20/2007  . ROSACEA 07/20/2007  . SPINAL STENOSIS, CERVICAL 07/20/2007  . URINARY INCONTINENCE 07/20/2007    Orientation RESPIRATION BLADDER Height & Weight     Self, Time, Situation, Place  O2 (2 L) Incontinent Weight: 127 lb (57.6 kg) Height:  5\' 2"  (157.5 cm)  BEHAVIORAL SYMPTOMS/MOOD NEUROLOGICAL BOWEL NUTRITION STATUS      Incontinent, Continent Diet (Carb Modified)  AMBULATORY STATUS COMMUNICATION OF NEEDS Skin   Limited Assist Verbally Normal                       Personal Care Assistance Level of Assistance  Bathing, Feeding, Dressing Bathing Assistance: Limited assistance Feeding assistance: Independent Dressing Assistance: Limited assistance     Functional Limitations Info  Sight, Speech, Hearing Sight Info: Adequate Hearing Info: Adequate Speech Info: Adequate    SPECIAL CARE FACTORS FREQUENCY  PT  (By licensed PT)     PT Frequency: Minimum 2x a week              Contractures Contractures Info: Not present    Additional Factors Info  Allergies, Psychotropic   Allergies Info: CODEINE PHOSPHATE, MEPERIDINE HCL, PENICILLINS  Psychotropic Info: QUEtiapine (SEROQUEL) tablet 25 mg         Current Medications (01/11/2017):  This is the current hospital active medication list Current Facility-Administered Medications  Medication Dose Route Frequency Provider Last Rate Last Dose  . acetaminophen (TYLENOL) tablet 500 mg  500 mg Oral BID Vaughan Basta, MD   500 mg at 01/11/17 1037  . ALPRAZolam Duanne Moron) tablet 0.25 mg  0.25 mg Oral BID PRN Vaughan Basta, MD      . aspirin EC tablet 81 mg  81 mg Oral Daily Vaughan Basta, MD   81 mg at 01/11/17 1038  . Chlorhexidine Gluconate Cloth 2 % PADS 6 each  6 each Topical Q0600 Vaughan Basta, MD   6 each at 01/11/17 [phone]  . digoxin (LANOXIN) tablet 0.125 mg  0.125 mg Oral Daily Vaughan Basta, MD   0.125 mg at 01/11/17 1037  . diltiazem (CARDIZEM CD) 24 hr capsule 240 mg  240 mg Oral Daily Vaughan Basta, MD   240 mg at 01/11/17 1037  . heparin injection 5,000 Units  5,000 Units Subcutaneous Q8H Vaughan Basta, MD   5,000 Units at 01/11/17 1307  . ipratropium-albuterol (DUONEB) 0.5-2.5 (3) MG/3ML nebulizer solution 3 mL  3 mL Nebulization BID Nicholes Mango, MD      . levofloxacin (LEVAQUIN)  tablet 750 mg  750 mg Oral [address] [place], RPH   750 mg at 01/11/17 1307  . meclizine (ANTIVERT) tablet 12.5 mg  12.5 mg Oral TID Vaughan Basta, MD   12.5 mg at 01/11/17 1307  . MEDLINE mouth rinse  15 mL Mouth Rinse BID Vaughan Basta, MD   15 mL at 01/11/17 1000  . metoprolol tartrate (LOPRESSOR) tablet 12.5 mg  12.5 mg Oral BID Vaughan Basta, MD   12.5 mg at 01/11/17 1000  . multivitamin with minerals tablet 1 tablet  1 tablet Oral Daily Merilyn Baba, RPH   1 tablet at 01/11/17  1038  . mupirocin ointment (BACTROBAN) 2 % 1 application  1 application Nasal BID Vaughan Basta, MD   1 application at [id number]  . MUSCLE RUB CREA   Topical BID PRN Merilyn Baba, RPH      . ondansetron [facility]) tablet 4 mg  4 mg Oral Q4H PRN Vaughan Basta, MD      . QUEtiapine (SEROQUEL) tablet 25 mg  25 mg Oral BID Vaughan Basta, MD   25 mg at 01/11/17 1038  . senna-docusate (Senokot-S) tablet 1 tablet  1 tablet Oral BID PRN Vaughan Basta, MD      . sodium chloride flush (NS) 0.9 % injection 3 mL  3 mL Intravenous Q12H Vaughan Basta, MD   3 mL at 01/11/17 1038  . vancomycin (VANCOCIN) IVPB 750 mg/150 ml premix  750 mg Intravenous Q24H Nicholes Mango, MD   750 mg at 01/11/17 0200     Discharge Medications: Please see discharge summary for a list of discharge medications.  Relevant Imaging Results:  Relevant Lab Results:   Additional Information SSN [id number]  Ross Ludwig, [place]

## 2017-01-11 NOTE — Discharge Summary (Addendum)
[facility] [place] NAME: Heather Barker    MR#:  [id number]  DATE OF BIRTH:  Sep 02, 1930  DATE OF ADMISSION:  01/09/2017 ADMITTING PHYSICIAN: Vaughan Basta, MD  DATE OF DISCHARGE: 01/11/17 PRIMARY CARE PHYSICIAN: Juluis Pitch, MD    ADMISSION DIAGNOSIS:  SOB (shortness of breath) [R06.02] Hypoxia [R09.02] Aspiration pneumonia of left upper lobe, unspecified aspiration pneumonia type ([place]) [J69.0]  DISCHARGE DIAGNOSIS:  Principal Problem:   Pneumonia Active Problems:   Acute on chronic respiratory failure ([place])   SECONDARY DIAGNOSIS:   Past Medical History:  Diagnosis Date  . Atrial fibrillation ([place])   . Basal cell cancer 1990's   forehead  . CHF (congestive heart failure) ([place])   . Depression   . Hypertension   . Osteoarthritis   . Psychosis   . Rosacea   . Stenosis, cervical spine   . Urinary incontinence     HOSPITAL COURSE:   HISTORY OF PRESENT ILLNESS: Heather Barker  is a 83 y.o. female with a known history of Atrial fibrillation, basal cell carcinoma, CHF, depression, hypertension, osteoarthritis, urinary incontinence- was treated for pneumonia last month in hospital and sent to the rehabilitation center back. She was at her baseline using 2 L of oxygen as needed basis mainly on the nighttime. She started having some cough and chills for last 1-2 days gradually getting worse. Also requiring more oxygen and so she was little more anxious today. Concerned with her hypoxia she was sent back to hospital from a nursing home. Found to have pneumonia on chest x-ray. Blood pressure is stable and patient is on increased requirement of oxygen than her baseline.  Hospital course  * Acute on chronic hypoxic respiratory failure 2/ 2Pneumonia Continue supplemental oxygen via nasal cannulaAs patient has acute on chronic hypoxic respiratory failure. Needs 2 L of oxygen continuous continued levofloxacin and  vancomycin  IV. Discharge patient with doxycycline Influenza is tested and are negative. positive MRSA screen  Get speech and swallow evaluation.-Recommending regular diet and thin liquids  Incentive spirometry.  * Atrial fibrillation Continue digoxin, Cardizem-rate is controlled.  * Chronic diastolic CHF Currently no signs of exacerbation.  * Hypertension Continue home medication, stable.  Disposition SNF   DISCHARGE CONDITIONS:   Stable  CONSULTS OBTAINED:     PROCEDURES   DRUG ALLERGIES:   Allergies  Allergen Reactions  . Codeine Phosphate     REACTION: intolerance--hydrocodone okay  . Meperidine Hcl     REACTION: unspecified  . Penicillins     DISCHARGE MEDICATIONS:   Current Discharge Medication List    START taking these medications   Details  doxycycline (VIBRAMYCIN) 50 MG capsule Take 2 capsules (100 mg total) by mouth 2 (two) times daily. Qty: 28 capsule, Refills: 0    ipratropium-albuterol (DUONEB) 0.5-2.5 (3) MG/3ML SOLN Take 3 mLs by nebulization every 6 (six) hours as needed. Qty: 360 mL, Refills: 0    mupirocin ointment (BACTROBAN) 2 % Place 1 application into the nose 2 (two) times daily. Qty: 22 g, Refills: 0      CONTINUE these medications which have NOT CHANGED   Details  acetaminophen (TYLENOL) 500 MG tablet Take 500 mg by mouth 2 (two) times daily.     aspirin EC 81 MG tablet Take 81 mg by mouth daily.    Biotin 10 MG TABS Take 10 mg by mouth daily.    digoxin (LANOXIN) 0.125 MG tablet Take 0.125 mg by mouth daily.  diltiazem (CARDIZEM CD) 240 MG 24 hr capsule Take 1 capsule (240 mg total) by mouth daily. Qty: 30 capsule, Refills: 0    lidocaine (LIDODERM) 5 % Place 1 patch onto the skin every 12 (twelve) hours. Remove & Discard patch within 12 hours or as directed by MD    Menthol, Topical Analgesic, (BIOFREEZE) 4 % GEL Apply 1 application topically 2 (two) times daily.    metoprolol tartrate (LOPRESSOR)  25 MG tablet Take 0.5 tablets (12.5 mg total) by mouth 2 (two) times daily. Qty: 60 tablet, Refills: 0    metroNIDAZOLE (METROGEL) 0.75 % gel Apply 1 application topically 2 (two) times daily as needed.     QUEtiapine (SEROQUEL) 25 MG tablet Take 1 tablet (25 mg total) by mouth 2 (two) times daily. Qty: 60 tablet, Refills: 0    senna-docusate (SENOKOT-S) 8.6-50 MG tablet Take 1 tablet by mouth 2 (two) times daily as needed for mild constipation.    meclizine (ANTIVERT) 12.5 MG tablet Take 12.5 mg by mouth 3 (three) times daily. At 0800, 1400, and 2000    Miconazole Nitrate 2 % AERP Apply topically. Apply topically to under breast and groin once a day 1500-2300    Multiple Vitamin (MULTIVITAMIN) tablet Take 1 tablet by mouth daily.    ondansetron (ZOFRAN) 4 MG tablet Take 4 mg by mouth every 4 (four) hours as needed for nausea or vomiting.         DISCHARGE INSTRUCTIONS:  Follow-up with primary care physician at the facility in 2-3 days   DIET:  Cardiac diet  DISCHARGE CONDITION:  Stable  ACTIVITY:  Activity as tolerated per PT  OXYGEN:  Home Oxygen: Yes.     Oxygen Delivery: 2 liters/min via Patient connected to nasal cannula oxygen  DISCHARGE LOCATION:  nursing home   If you experience worsening of your admission symptoms, develop shortness of breath, life threatening emergency, suicidal or homicidal thoughts you must seek medical attention immediately by calling 911 or calling your MD immediately  if symptoms less severe.  You Must read complete instructions/literature along with all the possible adverse reactions/side effects for all the Medicines you take and that have been prescribed to you. Take any new Medicines after you have completely understood and accpet all the possible adverse reactions/side effects.   Please note  You were cared for by a hospitalist during your hospital stay. If you have any questions about your discharge medications or the care you  received while you were in the hospital after you are discharged, you can call the unit and asked to speak with the hospitalist on call if the hospitalist that took care of you is not available. Once you are discharged, your primary care physician will handle any further medical issues. Please note that NO REFILLS for any discharge medications will be authorized once you are discharged, as it is imperative that you return to your primary care physician (or establish a relationship with a primary care physician if you do not have one) for your aftercare needs so that they can reassess your need for medications and monitor your lab values.     Today  Chief Complaint  Patient presents with  . Shortness of Breath   Patient is feeling much better shortness of breath improved. Feeling more energetic and wants to get discharged Patient needing 2 L of oxygen via nasal cannula continuously could not wean her off social worker will arrange that.  ROS:  CONSTITUTIONAL: Denies fevers, chills. Denies any fatigue,  weakness.  EYES: Denies blurry vision, double vision, eye pain. EARS, NOSE, THROAT: Denies tinnitus, ear pain, hearing loss. RESPIRATORY:  improved cough , wheeze, shortness of breath.  CARDIOVASCULAR: Denies chest pain, palpitations, edema.  GASTROINTESTINAL: Denies nausea, vomiting, diarrhea, abdominal pain. Denies bright red blood per rectum. GENITOURINARY: Denies dysuria, hematuria. ENDOCRINE: Denies nocturia or thyroid problems. HEMATOLOGIC AND LYMPHATIC: Denies easy bruising or bleeding. SKIN: Denies rash or lesion. MUSCULOSKELETAL: Denies pain in neck, back, shoulder, knees, hips or arthritic symptoms.  NEUROLOGIC: Denies paralysis, paresthesias.  PSYCHIATRIC: Denies anxiety or depressive symptoms.   VITAL SIGNS:  Blood pressure 121/71, pulse 95, temperature 98.1 F (36.7 C), temperature source Oral, resp. rate 18, height 5\' 2"  (1.575 m), weight 57.6 kg (127 lb), SpO2 (!) 87  %.  I/O:    Intake/Output Summary (Last 24 hours) at 01/11/17 1533 Last data filed at 01/11/17 0900  Gross per 24 hour  Intake              440 ml  Output                0 ml  Net              440 ml    PHYSICAL EXAMINATION:  GENERAL:  83 y.o.-year-old patient lying in the bed with no acute distress.  EYES: Pupils equal, round, reactive to light and accommodation. No scleral icterus. Extraocular muscles intact.  HEENT: Head atraumatic, normocephalic. Oropharynx and nasopharynx clear.  NECK:  Supple, no jugular venous distention. No thyroid enlargement, no tenderness.  LUNGS: Moderate  breath sounds bilaterally, no wheezing, rales,rhonchi or crepitation. No use of accessory muscles of respiration.  CARDIOVASCULAR: S1, S2 normal. No murmurs, rubs, or gallops.  ABDOMEN: Soft, non-tender, non-distended. Bowel sounds present. No organomegaly or mass.  EXTREMITIES: No pedal edema, cyanosis, or clubbing.  NEUROLOGIC: Cranial nerves II through XII are intact. Muscle strength 5/5 in all extremities. Sensation intact. Gait not checked.  PSYCHIATRIC: The patient is alert and oriented x 3.  SKIN: No obvious rash, lesion, or ulcer.   DATA REVIEW:   CBC  Recent Labs Lab 01/11/17 0559  WBC 12.9*  HGB 10.4*  HCT 31.6*  PLT 204    Chemistries   Recent Labs Lab 01/11/17 0559  NA 139  K 3.9  CL 106  CO2 25  GLUCOSE 87  BUN 24*  CREATININE 0.63  CALCIUM 8.8*    Cardiac Enzymes  Recent Labs Lab 01/09/17 0850  TROPONINI <0.03    Microbiology Results  Results for orders placed or performed during the hospital encounter of 01/09/17  Culture, blood (routine x 2)     Status: None (Preliminary result)   Collection Time: 01/09/17  7:40 AM  Result Value Ref Range Status   Specimen Description BLOOD LEFT ARM  Final   Special Requests   Final    BOTTLES DRAWN AEROBIC AND ANAEROBIC AER11ML ANA10ML   Culture NO GROWTH 2 DAYS  Final   Report Status PENDING  Incomplete  Culture,  blood (routine x 2)     Status: None (Preliminary result)   Collection Time: 01/09/17  7:45 AM  Result Value Ref Range Status   Specimen Description BLOOD RIGHT WRIST  Final   Special Requests BOTTLES DRAWN AEROBIC AND ANAEROBIC ANA8ML AER7ML  Final   Culture NO GROWTH 2 DAYS  Final   Report Status PENDING  Incomplete  MRSA PCR Screening     Status: Abnormal   Collection Time: 01/09/17 12:09 PM  Result Value Ref Range Status   MRSA by PCR POSITIVE (A) NEGATIVE Final    Comment:        The GeneXpert MRSA Assay (FDA approved for NASAL specimens only), is one component of a comprehensive MRSA colonization surveillance program. It is not intended to diagnose MRSA infection nor to guide or monitor treatment for MRSA infections. RESULT CALLED TO, READ BACK BY AND VERIFIED WITH: TAYLOR BECK ON 01/09/17 AT 1348 [facility]     RADIOLOGY:  Dg Chest Port 1 View  Result Date: 01/09/2017 CLINICAL DATA:  Shortness of breath. EXAM: PORTABLE CHEST 1 VIEW COMPARISON:  12/04/2016. FINDINGS: Stable enlarged cardiac silhouette and prominence of the interstitial markings. Linear scarring in both lungs is again demonstrated. Interval small amount of patchy density in the left upper lung zone. Increased density in the left lower lobe. Diffuse osteopenia. IMPRESSION: 1. Interval small amount of patchy density in the left upper lung zone, suspicious for pneumonia. 2. Stable cardiomegaly and chronic interstitial lung disease with probable superimposed interstitial pulmonary edema. 3. Increased left lower lobe atelectasis or pneumonia. Electronically Signed   By: Claudie Revering M.D.   On: 01/09/2017 08:22    EKG:   Orders placed or performed during the hospital encounter of 01/09/17  . ED EKG  . ED EKG      Management plans discussed with the patient, family and they are in agreement.  CODE STATUS:     Code Status Orders        Start     Ordered   01/09/17 1215  Limited resuscitation (code)  Continuous     Question Answer Comment  In the event of cardiac or respiratory ARREST: Initiate Code Blue, Call Rapid Response Yes   In the event of cardiac or respiratory ARREST: Perform CPR Yes   In the event of cardiac or respiratory ARREST: Perform Intubation/Mechanical Ventilation No   In the event of cardiac or respiratory ARREST: Use NIPPV/BiPAp only if indicated Yes   In the event of cardiac or respiratory ARREST: Administer ACLS medications if indicated Yes   In the event of cardiac or respiratory ARREST: Perform Defibrillation or Cardioversion if indicated Yes   Comments pt have a portable will paper sent with her.      01/09/17 1214    Code Status History    Date Active Date Inactive Code Status Order ID Comments User Context   12/04/2016  9:42 AM 12/07/2016 12:52 AM Full Code [id number]  Saundra Shelling, MD Inpatient   12/04/2016  6:46 AM 12/04/2016  9:41 AM Full Code [id number]  Saundra Shelling, MD ED    Advance Directive Documentation   Flowsheet Row Most Recent Value  Type of Advance Directive  Out of facility DNR (pink MOST or yellow form)  Pre-existing out of facility DNR order (yellow form or pink MOST form)  Pink MOST form placed in chart (order not valid for inpatient use)  "MOST" Form in Place?  No data      TOTAL TIME TAKING CARE OF THIS PATIENT: 45 minutes.   Note: This dictation was prepared with Dragon dictation along with smaller phrase technology. Any transcriptional errors that result from this process are unintentional.   @[facility] @  on 01/11/2017 at 3:33 PM  Between 7am to 6pm - Pager - [phone]  After 6pm go to [email or web address] - password [email or web address] [facility]  Office  [phone]  CC: Primary care physician; Juluis Pitch, MD

## 2017-01-13 DIAGNOSIS — M6281 Muscle weakness (generalized): Secondary | ICD-10-CM | POA: Diagnosis not present

## 2017-01-13 DIAGNOSIS — R42 Dizziness and giddiness: Secondary | ICD-10-CM | POA: Diagnosis not present

## 2017-01-13 DIAGNOSIS — I4891 Unspecified atrial fibrillation: Secondary | ICD-10-CM | POA: Diagnosis not present

## 2017-01-13 DIAGNOSIS — J189 Pneumonia, unspecified organism: Secondary | ICD-10-CM | POA: Diagnosis not present

## 2017-01-13 DIAGNOSIS — M549 Dorsalgia, unspecified: Secondary | ICD-10-CM | POA: Diagnosis not present

## 2017-01-14 DIAGNOSIS — Z79899 Other long term (current) drug therapy: Secondary | ICD-10-CM | POA: Diagnosis not present

## 2017-01-14 LAB — CULTURE, BLOOD (ROUTINE X 2)
Culture: NO GROWTH
Culture: NO GROWTH

## 2017-01-18 DIAGNOSIS — I10 Essential (primary) hypertension: Secondary | ICD-10-CM | POA: Diagnosis not present

## 2017-01-18 DIAGNOSIS — R41841 Cognitive communication deficit: Secondary | ICD-10-CM | POA: Diagnosis not present

## 2017-01-18 DIAGNOSIS — R1312 Dysphagia, oropharyngeal phase: Secondary | ICD-10-CM | POA: Diagnosis not present

## 2017-01-18 DIAGNOSIS — E569 Vitamin deficiency, unspecified: Secondary | ICD-10-CM | POA: Diagnosis not present

## 2017-01-18 DIAGNOSIS — F0151 Vascular dementia with behavioral disturbance: Secondary | ICD-10-CM | POA: Diagnosis not present

## 2017-01-18 DIAGNOSIS — K5900 Constipation, unspecified: Secondary | ICD-10-CM | POA: Diagnosis not present

## 2017-01-18 DIAGNOSIS — I4891 Unspecified atrial fibrillation: Secondary | ICD-10-CM | POA: Diagnosis not present

## 2017-01-18 DIAGNOSIS — B379 Candidiasis, unspecified: Secondary | ICD-10-CM | POA: Diagnosis not present

## 2017-01-18 DIAGNOSIS — G8911 Acute pain due to trauma: Secondary | ICD-10-CM | POA: Diagnosis not present

## 2017-01-18 DIAGNOSIS — R498 Other voice and resonance disorders: Secondary | ICD-10-CM | POA: Diagnosis not present

## 2017-02-14 DIAGNOSIS — I4891 Unspecified atrial fibrillation: Secondary | ICD-10-CM | POA: Diagnosis not present

## 2017-02-14 DIAGNOSIS — M6281 Muscle weakness (generalized): Secondary | ICD-10-CM | POA: Diagnosis not present

## 2017-02-14 DIAGNOSIS — R42 Dizziness and giddiness: Secondary | ICD-10-CM | POA: Diagnosis not present

## 2017-02-14 DIAGNOSIS — J189 Pneumonia, unspecified organism: Secondary | ICD-10-CM | POA: Diagnosis not present

## 2017-02-14 DIAGNOSIS — M549 Dorsalgia, unspecified: Secondary | ICD-10-CM | POA: Diagnosis not present

## 2017-03-04 ENCOUNTER — Emergency Department: Payer: Medicare Other

## 2017-03-04 ENCOUNTER — Inpatient Hospital Stay
Admission: EM | Admit: 2017-03-04 | Discharge: 2017-03-06 | DRG: 871 | Disposition: A | Discharge status: Skilled Nursing Facility | Payer: Medicare Other | Attending: Internal Medicine | Admitting: Internal Medicine

## 2017-03-04 ENCOUNTER — Encounter: Payer: Self-pay | Admitting: Emergency Medicine

## 2017-03-04 DIAGNOSIS — Z880 Allergy status to penicillin: Secondary | ICD-10-CM | POA: Diagnosis not present

## 2017-03-04 DIAGNOSIS — F323 Major depressive disorder, single episode, severe with psychotic features: Secondary | ICD-10-CM | POA: Diagnosis present

## 2017-03-04 DIAGNOSIS — Z888 Allergy status to other drugs, medicaments and biological substances status: Secondary | ICD-10-CM | POA: Diagnosis not present

## 2017-03-04 DIAGNOSIS — F329 Major depressive disorder, single episode, unspecified: Secondary | ICD-10-CM | POA: Diagnosis present

## 2017-03-04 DIAGNOSIS — J9600 Acute respiratory failure, unspecified whether with hypoxia or hypercapnia: Secondary | ICD-10-CM

## 2017-03-04 DIAGNOSIS — D649 Anemia, unspecified: Secondary | ICD-10-CM | POA: Diagnosis present

## 2017-03-04 DIAGNOSIS — J9621 Acute and chronic respiratory failure with hypoxia: Secondary | ICD-10-CM | POA: Diagnosis present

## 2017-03-04 DIAGNOSIS — Z79899 Other long term (current) drug therapy: Secondary | ICD-10-CM | POA: Diagnosis not present

## 2017-03-04 DIAGNOSIS — Y95 Nosocomial condition: Secondary | ICD-10-CM | POA: Diagnosis present

## 2017-03-04 DIAGNOSIS — E872 Acidosis: Secondary | ICD-10-CM | POA: Diagnosis present

## 2017-03-04 DIAGNOSIS — Z9981 Dependence on supplemental oxygen: Secondary | ICD-10-CM

## 2017-03-04 DIAGNOSIS — R0600 Dyspnea, unspecified: Secondary | ICD-10-CM | POA: Diagnosis not present

## 2017-03-04 DIAGNOSIS — J181 Lobar pneumonia, unspecified organism: Secondary | ICD-10-CM | POA: Diagnosis present

## 2017-03-04 DIAGNOSIS — I110 Hypertensive heart disease with heart failure: Secondary | ICD-10-CM | POA: Diagnosis present

## 2017-03-04 DIAGNOSIS — J9601 Acute respiratory failure with hypoxia: Secondary | ICD-10-CM | POA: Diagnosis not present

## 2017-03-04 DIAGNOSIS — J441 Chronic obstructive pulmonary disease with (acute) exacerbation: Secondary | ICD-10-CM | POA: Diagnosis present

## 2017-03-04 DIAGNOSIS — I5032 Chronic diastolic (congestive) heart failure: Secondary | ICD-10-CM | POA: Diagnosis present

## 2017-03-04 DIAGNOSIS — I5030 Unspecified diastolic (congestive) heart failure: Secondary | ICD-10-CM | POA: Diagnosis not present

## 2017-03-04 DIAGNOSIS — E785 Hyperlipidemia, unspecified: Secondary | ICD-10-CM | POA: Diagnosis present

## 2017-03-04 DIAGNOSIS — Z7982 Long term (current) use of aspirin: Secondary | ICD-10-CM

## 2017-03-04 DIAGNOSIS — J440 Chronic obstructive pulmonary disease with acute lower respiratory infection: Secondary | ICD-10-CM | POA: Diagnosis present

## 2017-03-04 DIAGNOSIS — I5031 Acute diastolic (congestive) heart failure: Secondary | ICD-10-CM

## 2017-03-04 DIAGNOSIS — R739 Hyperglycemia, unspecified: Secondary | ICD-10-CM | POA: Diagnosis present

## 2017-03-04 DIAGNOSIS — Z8249 Family history of ischemic heart disease and other diseases of the circulatory system: Secondary | ICD-10-CM | POA: Diagnosis not present

## 2017-03-04 DIAGNOSIS — R680 Hypothermia, not associated with low environmental temperature: Secondary | ICD-10-CM | POA: Diagnosis present

## 2017-03-04 DIAGNOSIS — I4891 Unspecified atrial fibrillation: Secondary | ICD-10-CM | POA: Diagnosis not present

## 2017-03-04 DIAGNOSIS — Z885 Allergy status to narcotic agent status: Secondary | ICD-10-CM | POA: Diagnosis not present

## 2017-03-04 DIAGNOSIS — Z85828 Personal history of other malignant neoplasm of skin: Secondary | ICD-10-CM

## 2017-03-04 DIAGNOSIS — Z809 Family history of malignant neoplasm, unspecified: Secondary | ICD-10-CM | POA: Diagnosis not present

## 2017-03-04 DIAGNOSIS — R05 Cough: Secondary | ICD-10-CM | POA: Diagnosis not present

## 2017-03-04 DIAGNOSIS — J189 Pneumonia, unspecified organism: Secondary | ICD-10-CM

## 2017-03-04 DIAGNOSIS — I482 Chronic atrial fibrillation: Secondary | ICD-10-CM | POA: Diagnosis present

## 2017-03-04 DIAGNOSIS — R0602 Shortness of breath: Secondary | ICD-10-CM | POA: Diagnosis not present

## 2017-03-04 DIAGNOSIS — A419 Sepsis, unspecified organism: Secondary | ICD-10-CM | POA: Diagnosis not present

## 2017-03-04 DIAGNOSIS — L8990 Pressure ulcer of unspecified site, unspecified stage: Secondary | ICD-10-CM | POA: Insufficient documentation

## 2017-03-04 LAB — CBC WITH DIFFERENTIAL/PLATELET
Basophils Absolute: 0.1 10*3/uL (ref 0–0.1)
Basophils Relative: 1 %
EOS ABS: 0.1 10*3/uL (ref 0–0.7)
Eosinophils Relative: 1 %
HCT: 37.9 % (ref 35.0–47.0)
HEMOGLOBIN: 11.9 g/dL — AB (ref 12.0–16.0)
LYMPHS ABS: 0.7 10*3/uL — AB (ref 1.0–3.6)
Lymphocytes Relative: 3 %
MCH: 28.4 pg (ref 26.0–34.0)
MCHC: 31.5 g/dL — AB (ref 32.0–36.0)
MCV: 90.2 fL (ref 80.0–100.0)
MONO ABS: 0.6 10*3/uL (ref 0.2–0.9)
Monocytes Relative: 3 %
NEUTROS PCT: 92 %
Neutro Abs: 19.6 10*3/uL — ABNORMAL HIGH (ref 1.4–6.5)
Platelets: 230 10*3/uL (ref 150–440)
RBC: 4.2 MIL/uL (ref 3.80–5.20)
RDW: 17.5 % — ABNORMAL HIGH (ref 11.5–14.5)
WBC: 21 10*3/uL — ABNORMAL HIGH (ref 3.6–11.0)

## 2017-03-04 LAB — TROPONIN I

## 2017-03-04 LAB — BASIC METABOLIC PANEL
Anion gap: 9 (ref 5–15)
BUN: 19 mg/dL (ref 6–20)
CHLORIDE: 104 mmol/L (ref 101–111)
CO2: 23 mmol/L (ref 22–32)
CREATININE: 0.57 mg/dL (ref 0.44–1.00)
Calcium: 8.5 mg/dL — ABNORMAL LOW (ref 8.9–10.3)
Glucose, Bld: 257 mg/dL — ABNORMAL HIGH (ref 65–99)
POTASSIUM: 3.9 mmol/L (ref 3.5–5.1)
SODIUM: 136 mmol/L (ref 135–145)

## 2017-03-04 LAB — GLUCOSE, CAPILLARY
Glucose-Capillary: 176 mg/dL — ABNORMAL HIGH (ref 65–99)
Glucose-Capillary: 181 mg/dL — ABNORMAL HIGH (ref 65–99)
Glucose-Capillary: 135 mg/dL — ABNORMAL HIGH (ref 65–99)
Glucose-Capillary: 180 mg/dL — ABNORMAL HIGH (ref 65–99)

## 2017-03-04 LAB — INFLUENZA PANEL BY PCR (TYPE A & B)
Influenza A By PCR: NEGATIVE
Influenza B By PCR: NEGATIVE

## 2017-03-04 LAB — BLOOD GAS, VENOUS
ACID-BASE DEFICIT: 3.2 mmol/L — AB (ref 0.0–2.0)
Bicarbonate: 24.9 mmol/L (ref 20.0–28.0)
Delivery systems: POSITIVE
O2 Saturation: 67.4 %
PATIENT TEMPERATURE: 37
PH VEN: 7.24 — AB (ref 7.250–7.430)
pCO2, Ven: 58 mmHg (ref 44.0–60.0)
pO2, Ven: 42 mmHg (ref 32.0–45.0)

## 2017-03-04 LAB — PROCALCITONIN: Procalcitonin: <0.1 ng/mL

## 2017-03-04 LAB — BRAIN NATRIURETIC PEPTIDE: B NATRIURETIC PEPTIDE 5: 168 pg/mL — AB (ref 0.0–100.0)

## 2017-03-04 LAB — MRSA PCR SCREENING: MRSA by PCR: NEGATIVE

## 2017-03-04 LAB — DIGOXIN LEVEL: DIGOXIN LVL: 0.4 ng/mL — AB (ref 0.8–2.0)

## 2017-03-04 LAB — LACTIC ACID, PLASMA: LACTIC ACID, VENOUS: 3 mmol/L — AB (ref 0.5–1.9)

## 2017-03-04 MED ORDER — ONDANSETRON HCL 4 MG/2ML IJ SOLN: 4.0000 mg | Freq: Four times a day (QID) | INTRAMUSCULAR | Status: DC | PRN

## 2017-03-04 MED ORDER — INSULIN ASPART 100 UNIT/ML ~~LOC~~ SOLN
0.0000 [IU] | SUBCUTANEOUS | Status: DC
  Administered 2017-03-04: 3 [IU] via SUBCUTANEOUS
  Administered 2017-03-04: 2 [IU] via SUBCUTANEOUS
  Administered 2017-03-04: 3 [IU] via SUBCUTANEOUS
  Filled 2017-03-04 (×2): qty 3
  Filled 2017-03-04: qty 2

## 2017-03-04 MED ORDER — SENNOSIDES-DOCUSATE SODIUM 8.6-50 MG PO TABS: 1.0000 | ORAL_TABLET | Freq: Two times a day (BID) | ORAL | Status: DC | PRN

## 2017-03-04 MED ORDER — SODIUM CHLORIDE 0.9 % IV BOLUS (SEPSIS)
1000.0000 mL | Freq: Once | INTRAVENOUS | Status: AC
  Administered 2017-03-04: 1000 mL via INTRAVENOUS

## 2017-03-04 MED ORDER — DILTIAZEM HCL 100 MG IV SOLR
5.0000 mg/h | INTRAVENOUS | Status: DC
  Administered 2017-03-04 – 2017-03-05 (×2): 5 mg/h via INTRAVENOUS
  Filled 2017-03-04 (×2): qty 100

## 2017-03-04 MED ORDER — LIDOCAINE 5 % EX PTCH
1.0000 | MEDICATED_PATCH | CUTANEOUS | Status: DC
  Administered 2017-03-04: 1 via TRANSDERMAL
  Filled 2017-03-04 (×2): qty 1

## 2017-03-04 MED ORDER — METOPROLOL TARTRATE 25 MG PO TABS
12.5000 mg | ORAL_TABLET | Freq: Two times a day (BID) | ORAL | Status: DC
  Administered 2017-03-04 – 2017-03-06 (×4): 12.5 mg via ORAL
  Filled 2017-03-04 (×4): qty 1

## 2017-03-04 MED ORDER — QUETIAPINE FUMARATE 25 MG PO TABS: 25.0000 mg | ORAL_TABLET | Freq: Two times a day (BID) | ORAL | Status: DC

## 2017-03-04 MED ORDER — ADULT MULTIVITAMIN W/MINERALS CH: 1.0000 | ORAL_TABLET | Freq: Every day | ORAL | Status: DC

## 2017-03-04 MED ORDER — DEXTROSE 5 % IV SOLN
2.0000 g | Freq: Once | INTRAVENOUS | Status: AC
  Administered 2017-03-04: 2 g via INTRAVENOUS
  Filled 2017-03-04: qty 2

## 2017-03-04 MED ORDER — DEXTROSE 5 % IV SOLN
1.0000 g | Freq: Two times a day (BID) | INTRAVENOUS | Status: DC
  Administered 2017-03-04 – 2017-03-06 (×4): 1 g via INTRAVENOUS
  Filled 2017-03-04 (×6): qty 1

## 2017-03-04 MED ORDER — VANCOMYCIN HCL IN DEXTROSE 1-5 GM/200ML-% IV SOLN
1000.0000 mg | Freq: Once | INTRAVENOUS | Status: AC
  Administered 2017-03-04: 1000 mg via INTRAVENOUS
  Filled 2017-03-04: qty 200

## 2017-03-04 MED ORDER — DIGOXIN 125 MCG PO TABS
0.1250 mg | ORAL_TABLET | Freq: Every day | ORAL | Status: DC
  Administered 2017-03-05 – 2017-03-06 (×2): 0.125 mg via ORAL
  Filled 2017-03-04 (×2): qty 1

## 2017-03-04 MED ORDER — METHYLPREDNISOLONE SODIUM SUCC 125 MG IJ SOLR
125.0000 mg | Freq: Once | INTRAMUSCULAR | Status: AC
  Administered 2017-03-04: 125 mg via INTRAVENOUS
  Filled 2017-03-04: qty 2

## 2017-03-04 MED ORDER — IPRATROPIUM-ALBUTEROL 0.5-2.5 (3) MG/3ML IN SOLN: 3.0000 mL | Freq: Once | RESPIRATORY_TRACT | Status: DC

## 2017-03-04 MED ORDER — ORAL CARE MOUTH RINSE
15.0000 mL | Freq: Two times a day (BID) | OROMUCOSAL | Status: DC
  Administered 2017-03-05: 15 mL via OROMUCOSAL

## 2017-03-04 MED ORDER — IPRATROPIUM-ALBUTEROL 0.5-2.5 (3) MG/3ML IN SOLN: 3.0000 mL | Freq: Four times a day (QID) | RESPIRATORY_TRACT | Status: DC | PRN

## 2017-03-04 MED ORDER — VANCOMYCIN HCL IN DEXTROSE 1-5 GM/200ML-% IV SOLN
1000.0000 mg | INTRAVENOUS | Status: DC
  Administered 2017-03-04: 1000 mg via INTRAVENOUS
  Filled 2017-03-04 (×2): qty 200

## 2017-03-04 MED ORDER — IPRATROPIUM-ALBUTEROL 0.5-2.5 (3) MG/3ML IN SOLN
3.0000 mL | Freq: Once | RESPIRATORY_TRACT | Status: AC
  Administered 2017-03-04: 9 mL via RESPIRATORY_TRACT

## 2017-03-04 MED ORDER — CHLORHEXIDINE GLUCONATE 0.12 % MT SOLN
15.0000 mL | Freq: Two times a day (BID) | OROMUCOSAL | Status: DC
  Administered 2017-03-04 – 2017-03-06 (×4): 15 mL via OROMUCOSAL
  Filled 2017-03-04 (×3): qty 15

## 2017-03-04 MED ORDER — DILTIAZEM HCL ER COATED BEADS 240 MG PO CP24: 240.0000 mg | ORAL_CAPSULE | Freq: Every day | ORAL | Status: DC

## 2017-03-04 MED ORDER — ASPIRIN EC 81 MG PO TBEC
81.0000 mg | DELAYED_RELEASE_TABLET | Freq: Every day | ORAL | Status: DC
  Administered 2017-03-05 – 2017-03-06 (×2): 81 mg via ORAL
  Filled 2017-03-04 (×2): qty 1

## 2017-03-04 MED ORDER — ENOXAPARIN SODIUM 40 MG/0.4ML ~~LOC~~ SOLN
40.0000 mg | SUBCUTANEOUS | Status: DC
  Administered 2017-03-04 – 2017-03-05 (×2): 40 mg via SUBCUTANEOUS
  Filled 2017-03-04 (×2): qty 0.4

## 2017-03-04 MED ORDER — IPRATROPIUM-ALBUTEROL 0.5-2.5 (3) MG/3ML IN SOLN
RESPIRATORY_TRACT | Status: AC
  Administered 2017-03-04: 9 mL via RESPIRATORY_TRACT
  Filled 2017-03-04: qty 9

## 2017-03-04 NOTE — Progress Notes (Signed)
Pharmacy Antibiotic Note  Heather Barker is a 83 y.o. female with a h/o CHF and atrial fibrillationadmitted on 03/04/2017 with respiratory failure due to multifocal PNA.  Pharmacy has been consulted for cefepime and vancomycin dosing.  Plan: Cefepime 2 g iv once then 1 g iv q 12 hours.   Vancomycin 1000 mg iv once followed by 1000 mg iv q 24 hours with stacked dosing. Will adjust dosing/check levels as indicated to target a goal trough of 15-20 mcg/ml.   Height: 5\' 2"  (157.5 cm) Weight: 151 lb 8 oz (68.7 kg) IBW/kg (Calculated) : 50.1  Temp (24hrs), Avg:96.7 F (35.9 C), Min:96.3 F (35.7 C), Max:97 F (36.1 C)   Recent Labs Lab 03/04/17 0656 03/04/17 0750  WBC 21.0*  --   CREATININE 0.57  --   LATICACIDVEN  --  3.0*    Estimated Creatinine Clearance: 47.5 mL/min (by C-G formula based on SCr of 0.57 mg/dL).    Allergies  Allergen Reactions  . Codeine Phosphate     REACTION: intolerance--hydrocodone okay  . Meperidine Hcl     REACTION: unspecified  . Penicillins     Antimicrobials this admission: Cefepime 3/9 >>  vancomycin 3/9 >>   Dose adjustments this admission:   Microbiology results: 3/9 BCx: pending 3/9 MRSA PCR: pending  Thank you for allowing pharmacy to be a part of this patient's care.  [name] 03/04/2017 12:07 PM

## 2017-03-04 NOTE — ED Provider Notes (Signed)
[facility] Emergency Department Provider Note    First MD Initiated Contact with Patient 03/04/17 [phone]     (approximate)  I have reviewed the triage vital signs and the nursing notes.   HISTORY  Chief Complaint Cough  Level V Caveat: respiratory distress  HPI Heather Barker is a 83 y.o. female history of A. fib as well as congestive heart failure presents with acute respiratory distress. Patient brought in by EMSfor worsening hypoxia shortness of breath and cough.  On my EMS to be hypoxic to 70% on her chronic 3 L nasal cannula. On arrival to the ER patient was severely hypoxic on 6 L nasal cannula and placed on BiPAP for respiratory distress.   Past Medical History:  Diagnosis Date  . Atrial fibrillation ([place])   . Basal cell cancer 1990's   forehead  . CHF (congestive heart failure) ([place])   . Depression   . Hypertension   . Osteoarthritis   . Psychosis   . Rosacea   . Stenosis, cervical spine   . Urinary incontinence    Family History  Problem Relation Age of Onset  . Cancer Mother     lung  . Hypertension Mother   . Heart disease Father   . Hypertension Father   . Diabetes Neg Hx    Past Surgical History:  Procedure Laterality Date  . APPENDECTOMY    . CATARACT EXTRACTION     left eye  . CATARACT EXTRACTION  07/2010   right, IOL  . DILATION AND CURETTAGE OF UTERUS  1970's  . KNEE ARTHROSCOPY  1996   right   . ROTATOR CUFF REPAIR     left torn   Patient Active Problem List   Diagnosis Date Noted  . Acute respiratory failure ([place]) 03/04/2017  . Acute on chronic respiratory failure ([place]) 01/09/2017  . Aspiration pneumonia ([place]) 12/04/2016  . Pneumonia 12/04/2016  . Dementia with psychosis 01/02/2016  . Hypertension 01/02/2016  . OSTEOARTHRITIS 07/04/2008  . LEG PAIN 09/28/2007  . DEPRESSION 07/20/2007  . HYPERTENSION 07/20/2007  . ROSACEA 07/20/2007  . SPINAL STENOSIS, CERVICAL 07/20/2007  . URINARY INCONTINENCE  07/20/2007      Prior to Admission medications   Medication Sig Start Date End Date Taking? Authorizing Provider  acetaminophen (TYLENOL) 500 MG tablet Take 500 mg by mouth 2 (two) times daily.    Yes Historical Provider, MD  aspirin EC 81 MG tablet Take 81 mg by mouth daily.   Yes Historical Provider, MD  Biotin 10 MG TABS Take 10 mg by mouth daily.   Yes Historical Provider, MD  digoxin (LANOXIN) 0.125 MG tablet Take 0.125 mg by mouth daily.   Yes Historical Provider, MD  diltiazem (CARDIZEM CD) 240 MG 24 hr capsule Take 1 capsule (240 mg total) by mouth daily. 12/07/16  Yes Vaughan Basta, MD  ipratropium-albuterol (DUONEB) 0.5-2.5 (3) MG/3ML SOLN Take 3 mLs by nebulization every 6 (six) hours as needed. 01/11/17  Yes Nicholes Mango, MD  lidocaine (LIDODERM) 5 % Place 1 patch onto the skin every 12 (twelve) hours. Remove & Discard patch within 12 hours or as directed by MD   Yes Historical Provider, MD  meclizine (ANTIVERT) 12.5 MG tablet Take 12.5 mg by mouth 3 (three) times daily. At 0800, 1400, and 2000   Yes Historical Provider, MD  Menthol, Topical Analgesic, (BIOFREEZE) 4 % GEL Apply 1 application topically 2 (two) times daily.   Yes Historical Provider, MD  metoprolol tartrate (LOPRESSOR) 25  MG tablet Take 0.5 tablets (12.5 mg total) by mouth 2 (two) times daily. 12/06/16  Yes Vaughan Basta, MD  metroNIDAZOLE (METROGEL) 0.75 % gel Apply 1 application topically 2 (two) times daily as needed.    Yes Historical Provider, MD  Multiple Vitamin (MULTIVITAMIN) tablet Take 1 tablet by mouth daily.   Yes Historical Provider, MD  ondansetron (ZOFRAN) 4 MG tablet Take 4 mg by mouth every 4 (four) hours as needed for nausea or vomiting.   Yes Historical Provider, MD  QUEtiapine (SEROQUEL) 25 MG tablet Take 1 tablet (25 mg total) by mouth 2 (two) times daily. 12/06/16  Yes Vaughan Basta, MD  senna-docusate (SENOKOT-S) 8.6-50 MG tablet Take 1 tablet by mouth 2 (two) times daily as  needed for mild constipation.   Yes Historical Provider, MD  Miconazole Nitrate 2 % AERP Apply topically. Apply topically to under breast and groin once a day 1500-2300    Historical Provider, MD    Allergies Codeine phosphate; Meperidine hcl; and Penicillins    Social History Social History  Substance Use Topics  . Smoking status: Never Smoker  . Smokeless tobacco: Never Used  . Alcohol use Yes     Comment: occasional    Review of Systems Patient denies headaches, rhinorrhea, blurry vision, numbness, shortness of breath, chest pain, edema, cough, abdominal pain, nausea, vomiting, diarrhea, dysuria, fevers, rashes or hallucinations unless otherwise stated above in HPI. ____________________________________________   PHYSICAL EXAM:  VITAL SIGNS: Vitals:   03/04/17 0700 03/04/17 0730  BP: 133/71 140/90  Pulse: 96   Resp: (!) 23 (!) 22  Temp:      Constitutional: Alert, critically ill appearing, in severe respiratory distress Eyes: Conjunctivae are normal. PERRL. EOMI. Head: Atraumatic. Nose: No congestion/rhinnorhea. Mouth/Throat: Mucous membranes are moist.  Oropharynx non-erythematous. Neck: No stridor. Painless ROM. No cervical spine tenderness to palpation Hematological/Lymphatic/Immunilogical: No cervical lymphadenopathy. Cardiovascular: Normal rate, regular rhythm. Grossly normal heart sounds.  Good peripheral circulation. Respiratory: tachypnic, diffuse rhonchi and wheezing, + use of accessory muscles, Gastrointestinal: Soft and nontender. No distention. No abdominal bruits. No CVA tenderness. Genitourinary:  Musculoskeletal: No lower extremity tenderness nor edema.  No joint effusions. Neurologic:  Normal speech and language. No facial droop Skin:  Skin is warm, dry and intact. No rash noted.   ____________________________________________   LABS (all labs ordered are listed, but only abnormal results are displayed)  Results for orders placed or performed  during the hospital encounter of 03/04/17 (from the past 24 hour(s))  Blood gas, venous     Status: Abnormal   Collection Time: 03/04/17  6:55 AM  Result Value Ref Range   Delivery systems BILEVEL POSITIVE AIRWAY PRESSURE    pH, Ven 7.24 (L) 7.250 - 7.430   pCO2, Ven 58 44.0 - 60.0 mmHg   pO2, Ven 42.0 32.0 - 45.0 mmHg   Bicarbonate 24.9 20.0 - 28.0 mmol/L   Acid-base deficit 3.2 (H) 0.0 - 2.0 mmol/L   O2 Saturation 67.4 %   Patient temperature 37.0    Collection site VENOUS    Sample type VENOUS   CBC with Differential/Platelet     Status: Abnormal   Collection Time: 03/04/17  6:56 AM  Result Value Ref Range   WBC 21.0 (H) 3.6 - 11.0 K/uL   RBC 4.20 3.80 - 5.20 MIL/uL   Hemoglobin 11.9 (L) 12.0 - 16.0 g/dL   HCT 37.9 35.0 - 47.0 %   MCV 90.2 80.0 - 100.0 fL   MCH 28.4 26.0 - 34.0  pg   MCHC 31.5 (L) 32.0 - 36.0 g/dL   RDW 17.5 (H) 11.5 - 14.5 %   Platelets 230 150 - 440 K/uL   Neutrophils Relative % 92 %   Neutro Abs 19.6 (H) 1.4 - 6.5 K/uL   Lymphocytes Relative 3 %   Lymphs Abs 0.7 (L) 1.0 - 3.6 K/uL   Monocytes Relative 3 %   Monocytes Absolute 0.6 0.2 - 0.9 K/uL   Eosinophils Relative 1 %   Eosinophils Absolute 0.1 0 - 0.7 K/uL   Basophils Relative 1 %   Basophils Absolute 0.1 0 - 0.1 K/uL  Basic metabolic panel     Status: Abnormal   Collection Time: 03/04/17  6:56 AM  Result Value Ref Range   Sodium 136 135 - 145 mmol/L   Potassium 3.9 3.5 - 5.1 mmol/L   Chloride 104 101 - 111 mmol/L   CO2 23 22 - 32 mmol/L   Glucose, Bld 257 (H) 65 - 99 mg/dL   BUN 19 6 - 20 mg/dL   Creatinine, Ser 0.57 0.44 - 1.00 mg/dL   Calcium 8.5 (L) 8.9 - 10.3 mg/dL   GFR calc non Af Amer >60 >60 mL/min   GFR calc Af Amer >60 >60 mL/min   Anion gap 9 5 - 15  Brain natriuretic peptide     Status: Abnormal   Collection Time: 03/04/17  6:56 AM  Result Value Ref Range   B Natriuretic Peptide 168.0 (H) 0.0 - 100.0 pg/mL  Troponin I     Status: None   Collection Time: 03/04/17  6:56 AM    Result Value Ref Range   Troponin I <0.03 <0.03 ng/mL  Lactic acid, plasma     Status: Abnormal   Collection Time: 03/04/17  7:50 AM  Result Value Ref Range   Lactic Acid, Venous 3.0 (HH) 0.5 - 1.9 mmol/L   ____________________________________________  EKG My review and personal interpretation at Time: 06:58   Indication: sob  Rate: 90  Rhythm: afib Axis: normal Other: no STEMI, normal intervals ____________________________________________  RADIOLOGY  I personally reviewed all radiographic images ordered to evaluate for the above acute complaints and reviewed radiology reports and findings.  These findings were personally discussed with the patient.  Please see medical record for radiology report.  ____________________________________________   PROCEDURES  Procedure(s) performed:  Procedures    Critical Care performed: yes CRITICAL CARE Performed by: Merlyn Lot   Total critical care time: 45 minutes  Critical care time was exclusive of separately billable procedures and treating other patients.  Critical care was necessary to treat or prevent imminent or life-threatening deterioration.  Critical care was time spent personally by me on the following activities: development of treatment plan with patient and/or surrogate as well as nursing, discussions with consultants, evaluation of patient's response to treatment, examination of patient, obtaining history from patient or surrogate, ordering and performing treatments and interventions, ordering and review of laboratory studies, ordering and review of radiographic studies, pulse oximetry and re-evaluation of patient's condition.  ____________________________________________   INITIAL IMPRESSION / ASSESSMENT AND PLAN / ED COURSE  Pertinent labs & imaging results that were available during my care of the patient were reviewed by me and considered in my medical decision making (see chart for details).  DDX:  Asthma, copd, CHF, pna, ptx, malignancy, Pe, anemia   Heather Barker is a 83 y.o. who presents to the ED with acute hypoxic respiratory failure and appears critically ill upon arrival to the  ER. She was placed on BiPAP immediately upon arrival due to respiratory distress. Oxygenation improved with 100% FiO2. Patient with multiple comorbidities which could explain respiratory distress. P treated for COPD exacerbation with serial nebulizer treatments as well as IV steroids. Will also empirically started on antibiotics as she was recently admitted to the hospital and is hypothermic here in the ER. Certainly concern for sepsis. EKG does not show any evidence of ST elevation MI. Heart failure does remain on differential.  The patient will be placed on continuous pulse oximetry and telemetry for monitoring.  Laboratory evaluation will be sent to evaluate for the above complaints.     Clinical Course as of Mar 05 835  Fri Mar 04, 2017  0757 Chest x-ray concerning for worsening right middle lobe pneumonia. Patient with markedly leukocytosis. No significant hypercapnia on VBG. Oxygenation is improving and currently weaning down to 80 FiO2. We'll continue to de-escalate. Patient states that she is feeling better on BiPAP.  Consistent with heart failure based on chest x-ray as well as a BNP.  [PR]  0801 I spoke with Dr. Leonidas Romberg regarding the patients presentation and he kindly agrees to accept patient to the ICU.    [PR]  [id number] IV abx infusing. Will add on digoxin level.  Will continue to monitor patient.    [PR]  0813 FiO2 reduced to 60%  [PR]  0830 Lactate elevated to 3.0.  Will continue IVF resuscitation though will not order full 30cc/kg as patient does not appear significantly intravascularly depleted and has h/o CHF.   [PR]    Clinical Course User Index [PR] Merlyn Lot, MD     ____________________________________________   FINAL CLINICAL IMPRESSION(S) / ED DIAGNOSES  Final diagnoses:   Acute respiratory failure with hypoxia ([place])  Pneumonia of right middle lobe due to infectious organism ([place])  Sepsis, due to unspecified organism [facility])      NEW MEDICATIONS STARTED DURING THIS VISIT:  New Prescriptions   No medications on file     Note:  This document was prepared using Dragon voice recognition software and may include unintentional dictation errors.    Merlyn Lot, MD 03/04/17 [phone]

## 2017-03-04 NOTE — Progress Notes (Signed)
During rounds advised that patient would not be able to take PO due to patient being on bi-pap and lethargic. At this point no new orders given.

## 2017-03-04 NOTE — ED Triage Notes (Signed)
Pt to rm 25 via EMS from [facility], report cough w/ yellow sputum and SOB, o2sat 74% on 3L which pt on chronically. Pt received 2.5 albuterol and 1 duoneb with EMS, o2 increased to 6L via [place], upon arrival pt 86%.  RT and EDP at bedside.

## 2017-03-04 NOTE — H&P (Signed)
PULMONARY / CRITICAL CARE MEDICINE   Name: Heather Barker MRN: [id number] DOB: 1930-05-10    ADMISSION DATE:  03/04/2017 CONSULTATION DATE:  03/04/2017  REFERRING MD: Dr. Quentin Cornwall   CHIEF COMPLAINT:  Cough   HISTORY OF PRESENT ILLNESS:   This is an 83 yo female with a PMH of Urinary Incontinence, Cervical Spine Stenosis, Rosacea, Psychosis, Osteoarthritis, HTN, Depression, Diastolic CHF, Basal Cell Cancer, Hyperlipidemia, Atrial Fibrillation, and Rectal Bleeding secondary to Xarelto.  She presented to [facility] ER from [facility] 03/9 with worsening cough and shortness of breath.  Per ER notes EMS was notified by [facility] and upon arrival the pt was hypoxic with O2 sats 70% on her chronic O2 at 3L. Upon arrival to ER she remained severely hypoxic and was placed on 6L O2 then subsequently placed on Bipap. CXR revealed multifocal pneumonia.  PCCM contacted to admit pt to ICU with acute on chronic hypoxic respiratory failure secondary to multifocal pneumonia requiring continuous Bipap.   PAST MEDICAL HISTORY :  She  has a past medical history of Atrial fibrillation ([place]); Basal cell cancer (1990's); CHF (congestive heart failure) ([place]); Depression; Hypertension; Osteoarthritis; Psychosis; Rosacea; Stenosis, cervical spine; and Urinary incontinence.  PAST SURGICAL HISTORY: She  has a past surgical history that includes Appendectomy; Dilation and curettage of uterus (1970's); Cataract extraction; Knee arthroscopy (1996); Rotator cuff repair; and Cataract extraction (07/2010).  Allergies  Allergen Reactions  . Codeine Phosphate     REACTION: intolerance--hydrocodone okay  . Meperidine Hcl     REACTION: unspecified  . Penicillins     No current facility-administered medications on file prior to encounter.    Current Outpatient Prescriptions on File Prior to Encounter  Medication Sig  . acetaminophen (TYLENOL) 500 MG tablet Take 500 mg by mouth 2 (two) times daily.   Marland Kitchen aspirin EC 81 MG  tablet Take 81 mg by mouth daily.  . Biotin 10 MG TABS Take 10 mg by mouth daily.  . digoxin (LANOXIN) 0.125 MG tablet Take 0.125 mg by mouth daily.  Marland Kitchen diltiazem (CARDIZEM CD) 240 MG 24 hr capsule Take 1 capsule (240 mg total) by mouth daily.  Marland Kitchen ipratropium-albuterol (DUONEB) 0.5-2.5 (3) MG/3ML SOLN Take 3 mLs by nebulization every 6 (six) hours as needed.  . lidocaine (LIDODERM) 5 % Place 1 patch onto the skin every 12 (twelve) hours. Remove & Discard patch within 12 hours or as directed by MD  . meclizine (ANTIVERT) 12.5 MG tablet Take 12.5 mg by mouth 3 (three) times daily. At 0800, 1400, and 2000  . Menthol, Topical Analgesic, (BIOFREEZE) 4 % GEL Apply 1 application topically 2 (two) times daily.  . metoprolol tartrate (LOPRESSOR) 25 MG tablet Take 0.5 tablets (12.5 mg total) by mouth 2 (two) times daily.  . metroNIDAZOLE (METROGEL) 0.75 % gel Apply 1 application topically 2 (two) times daily as needed.   . Multiple Vitamin (MULTIVITAMIN) tablet Take 1 tablet by mouth daily.  . ondansetron (ZOFRAN) 4 MG tablet Take 4 mg by mouth every 4 (four) hours as needed for nausea or vomiting.  Marland Kitchen QUEtiapine (SEROQUEL) 25 MG tablet Take 1 tablet (25 mg total) by mouth 2 (two) times daily.  Marland Kitchen senna-docusate (SENOKOT-S) 8.6-50 MG tablet Take 1 tablet by mouth 2 (two) times daily as needed for mild constipation.  . Miconazole Nitrate 2 % AERP Apply topically. Apply topically to under breast and groin once a day 1500-2300    FAMILY HISTORY:  Her indicated that her mother is deceased. She  indicated that her father is deceased. She indicated that the status of her neg hx is unknown.    SOCIAL HISTORY: She  reports that she has never smoked. She has never used smokeless tobacco. She reports that she drinks alcohol. She reports that she does not use drugs.  REVIEW OF SYSTEMS:   Unable to assess pt on Bipap  SUBJECTIVE:  Unable to assess pt on Bipap  VITAL SIGNS: BP 140/87   Pulse (!) 101   Temp (!)  96.3 F (35.7 C) (Oral)   Resp (!) 29   Ht 5\' 2"  (1.575 m)   Wt 68.7 kg (151 lb 8 oz)   SpO2 100%   BMI 27.71 kg/m   HEMODYNAMICS:    VENTILATOR SETTINGS: FiO2 (%):  [100 %] 100 %  INTAKE / OUTPUT: No intake/output data recorded.  PHYSICAL EXAMINATION: General: chronically ill appearing Caucasian female Neuro: lethargic, follows commands, PERRLA HEENT: supple, no JVD Cardiovascular: irregular, irregular, no M/R/G Lungs: rhonchi throughout, even, non labored on Bipap Abdomen: +BS x4, soft, non tender, non distended Musculoskeletal: normal tone 1+ bilateral lower extremity edema  Skin: scattered reddened rash bilateral lower extremities   LABS:  BMET  Recent Labs Lab 03/04/17 0656  NA 136  K 3.9  CL 104  CO2 23  BUN 19  CREATININE 0.57  GLUCOSE 257*    Electrolytes  Recent Labs Lab 03/04/17 0656  CALCIUM 8.5*    CBC  Recent Labs Lab 03/04/17 0656  WBC 21.0*  HGB 11.9*  HCT 37.9  PLT 230    Coag's No results for input(s): APTT, INR in the last 168 hours.  Sepsis Markers  Recent Labs Lab 03/04/17 0750  LATICACIDVEN 3.0*    ABG No results for input(s): PHART, PCO2ART, PO2ART in the last 168 hours.  Liver Enzymes No results for input(s): AST, ALT, ALKPHOS, BILITOT, ALBUMIN in the last 168 hours.  Cardiac Enzymes  Recent Labs Lab 03/04/17 0656  TROPONINI <0.03    Glucose No results for input(s): GLUCAP in the last 168 hours.  Imaging Dg Chest Portable 1 View  Result Date: 03/04/2017 CLINICAL DATA:  Shortness of breath and cough. EXAM: PORTABLE CHEST 1 VIEW COMPARISON:  01/09/2017 FINDINGS: Increased patchy densities in the right hilum and mid right lung. Improved aeration in the left upper lung compared to the previous examination. Densities at the left lung base concerning for pleural fluid and/or consolidation. Heart size appears to be enlarged and stable. Atherosclerotic calcifications at the aortic arch. IMPRESSION: Bilateral  patchy lung densities. Worsening disease in the right lung with some improvement in the left upper lung. Findings are concerning for airspace disease and pneumonia. Probable pleural effusions. Electronically Signed   By: Markus Daft M.D.   On: 03/04/2017 07:41   STUDIES:  None   CULTURES: Blood x2 03/9>>  ANTIBIOTICS: Cefepime 03/9>> Vancomycin 03/9>>  SIGNIFICANT EVENTS: 03/9-Pt admitted to ICU with acute on chronic hypoxic respiratory failure secondary to multifocal pneumonia requiring continuous Bipap  LINES/TUBES: PIV's>>  ASSESSMENT / PLAN:  PULMONARY A: Acute on chronic hypoxic respiratory failure secondary to multifocal pneumonia  P:   Continuous Bipap for now wean as tolerated  Maintain O2 sats >92% Repeat CXR in am  Pulmonary hygiene once off Bipap Will need CT Chest once pt able to tolerate laying flat   CARDIOVASCULAR A:  Atrial fibrillation-rate controlled Hx: HTN P:  Continuous telemetry monitoring Continue outpatient aspirin, digoxin, diltiazem, and metoprolol   RENAL A:   Lactic acidosis  P:   Trend BMP's Replace electrolytes as indicated Monitor UOP Trend lactic acid   GASTROINTESTINAL A:   No acute issues  P:   Heart health diet once off Bipap   HEMATOLOGIC A:   Mild anemia without acute blood loss P:  Trend CBC Lovenox for VTE prophylaxis  Monitor for s/sx of bleeding Transfuse for hgb <7  INFECTIOUS A:   Multifocal pneumonia  P:   Trend WBC's and monitor fever curve Trend lactic acid and PCT's Follow cultures  Continue abx as listed above  ENDOCRINE A:   Hyperglycemia  P:   CBG's q4hr SSI   NEUROLOGIC A:   No acute issues  Hx: Depression and Psychosis  P:   Avoid sedating medications  Frequent reorientation Lights on during the day  Promote family presence at bedside    FAMILY  - Updates: Pts healthcare POA Beatrix Fetters updated regarding plan of care and all questions answered 03/04/2017  -  Inter-disciplinary family meet or Palliative Care meeting due by: 03/11/2017  Marda Stalker, [place] Pager [phone] (please enter 7 digits) PCCM Consult Pager [phone] (please enter 7 digits)   PCCM ATTENDING ATTESTATION:  I have evaluated patient with the APP [place], reviewed database in its entirety and discussed care plan in detail. In addition, this patient was discussed on multidisciplinary rounds.   She presented with signs and symptoms of pneumonia and a chest x-ray consistent with this diagnosis. She was in respiratory distress when evaluated in the emergency department. She has previously been designated DO NOT INTUBATE. She is supported on noninvasive ventilation.  Important exam findings: Lethargic No overt distress on BiPAP Diffuse bilateral rhonchi Irregularly irregular with no murmurs noted Abdomen soft nontender Extremities warm with symmetric edema  Major problems addressed by PCCM team: Healthcare associated pneumonia Acute hypoxic respiratory failure Chronic atrial fibrillation with controlled ventricular rate Chronic frailty and debilitation   PLAN/REC: DO NOT INTUBATE status is appropriate and will be continued Continue support on BiPAP Broad-spectrum antibiotics Diltiazem will be administered intravenously until she is able to take by mouth medications Monitor BMET intermittently Monitor I/Os Correct electrolytes as indicated DVT px: enoxaparin Monitor CBC intermittently Transfuse per usual guidelines   Merton Border, MD PCCM service Mobile [phone] Pager [phone] 03/04/2017

## 2017-03-05 ENCOUNTER — Inpatient Hospital Stay: Payer: Medicare Other

## 2017-03-05 DIAGNOSIS — L8990 Pressure ulcer of unspecified site, unspecified stage: Secondary | ICD-10-CM | POA: Insufficient documentation

## 2017-03-05 LAB — CBC WITH DIFFERENTIAL/PLATELET
Basophils Absolute: 0.2 10*3/uL — ABNORMAL HIGH (ref 0–0.1)
Basophils Relative: 1 %
Eosinophils Absolute: 0 10*3/uL (ref 0–0.7)
Eosinophils Relative: 0 %
HEMATOCRIT: 33.2 % — AB (ref 35.0–47.0)
Hemoglobin: 11 g/dL — ABNORMAL LOW (ref 12.0–16.0)
LYMPHS ABS: 1.1 10*3/uL (ref 1.0–3.6)
LYMPHS PCT: 6 %
MCH: 29.1 pg (ref 26.0–34.0)
MCHC: 33.2 g/dL (ref 32.0–36.0)
MCV: 87.7 fL (ref 80.0–100.0)
MONO ABS: 0.5 10*3/uL (ref 0.2–0.9)
MONOS PCT: 3 %
NEUTROS ABS: 17.1 10*3/uL — AB (ref 1.4–6.5)
Neutrophils Relative %: 90 %
Platelets: 221 10*3/uL (ref 150–440)
RBC: 3.79 MIL/uL — ABNORMAL LOW (ref 3.80–5.20)
RDW: 16.6 % — AB (ref 11.5–14.5)
WBC: 19 10*3/uL — ABNORMAL HIGH (ref 3.6–11.0)

## 2017-03-05 LAB — BLOOD CULTURE ID PANEL (REFLEXED)
ACINETOBACTER BAUMANNII: NOT DETECTED
CANDIDA ALBICANS: NOT DETECTED
CANDIDA GLABRATA: NOT DETECTED
CANDIDA TROPICALIS: NOT DETECTED
Candida krusei: NOT DETECTED
Candida parapsilosis: NOT DETECTED
ENTEROBACTER CLOACAE COMPLEX: NOT DETECTED
ENTEROBACTERIACEAE SPECIES: NOT DETECTED
ENTEROCOCCUS SPECIES: NOT DETECTED
Escherichia coli: NOT DETECTED
Haemophilus influenzae: NOT DETECTED
KLEBSIELLA PNEUMONIAE: NOT DETECTED
Klebsiella oxytoca: NOT DETECTED
Listeria monocytogenes: NOT DETECTED
NEISSERIA MENINGITIDIS: NOT DETECTED
Proteus species: NOT DETECTED
Pseudomonas aeruginosa: NOT DETECTED
STREPTOCOCCUS AGALACTIAE: NOT DETECTED
STREPTOCOCCUS PYOGENES: NOT DETECTED
STREPTOCOCCUS SPECIES: NOT DETECTED
Serratia marcescens: NOT DETECTED
Staphylococcus aureus (BCID): NOT DETECTED
Staphylococcus species: NOT DETECTED
Streptococcus pneumoniae: NOT DETECTED

## 2017-03-05 LAB — GLUCOSE, CAPILLARY
Glucose-Capillary: 113 mg/dL — ABNORMAL HIGH (ref 65–99)
Glucose-Capillary: 114 mg/dL — ABNORMAL HIGH (ref 65–99)
Glucose-Capillary: 118 mg/dL — ABNORMAL HIGH (ref 65–99)

## 2017-03-05 LAB — BASIC METABOLIC PANEL
ANION GAP: 8 (ref 5–15)
BUN: 17 mg/dL (ref 6–20)
CO2: 25 mmol/L (ref 22–32)
CREATININE: 0.35 mg/dL — AB (ref 0.44–1.00)
Calcium: 8.8 mg/dL — ABNORMAL LOW (ref 8.9–10.3)
Chloride: 105 mmol/L (ref 101–111)
GFR calc Af Amer: >60 mL/min (ref >60)
GFR calc non Af Amer: >60 mL/min (ref >60)
GLUCOSE: 124 mg/dL — AB (ref 65–99)
Potassium: 4.4 mmol/L (ref 3.5–5.1)
Sodium: 138 mmol/L (ref 135–145)

## 2017-03-05 LAB — LACTIC ACID, PLASMA: LACTIC ACID, VENOUS: 0.9 mmol/L (ref 0.5–1.9)

## 2017-03-05 LAB — PROCALCITONIN: Procalcitonin: 0.62 ng/mL

## 2017-03-05 MED ORDER — QUETIAPINE FUMARATE 25 MG PO TABS
25.0000 mg | ORAL_TABLET | Freq: Two times a day (BID) | ORAL | Status: DC
  Administered 2017-03-05 – 2017-03-06 (×3): 25 mg via ORAL
  Filled 2017-03-05 (×3): qty 1

## 2017-03-05 MED ORDER — FUROSEMIDE 40 MG PO TABS
40.0000 mg | ORAL_TABLET | Freq: Every day | ORAL | Status: DC
  Administered 2017-03-05 – 2017-03-06 (×2): 40 mg via ORAL
  Filled 2017-03-05 (×2): qty 1

## 2017-03-05 MED ORDER — SODIUM CHLORIDE 0.9% FLUSH
3.0000 mL | Freq: Two times a day (BID) | INTRAVENOUS | Status: DC
  Administered 2017-03-05: 3 mL via INTRAVENOUS

## 2017-03-05 MED ORDER — LIDOCAINE 5 % EX PTCH
1.0000 | MEDICATED_PATCH | Freq: Two times a day (BID) | CUTANEOUS | Status: DC
  Administered 2017-03-05 – 2017-03-06 (×3): 1 via TRANSDERMAL
  Filled 2017-03-05 (×4): qty 1

## 2017-03-05 MED ORDER — DILTIAZEM HCL ER COATED BEADS 240 MG PO CP24
240.0000 mg | ORAL_CAPSULE | Freq: Every day | ORAL | Status: DC
  Administered 2017-03-05 – 2017-03-06 (×2): 240 mg via ORAL
  Filled 2017-03-05 (×2): qty 1

## 2017-03-05 MED ORDER — DILTIAZEM HCL 30 MG PO TABS
30.0000 mg | ORAL_TABLET | Freq: Four times a day (QID) | ORAL | Status: DC
  Administered 2017-03-05: 30 mg via ORAL
  Filled 2017-03-05: qty 1

## 2017-03-05 NOTE — Evaluation (Signed)
Clinical/Bedside Swallow Evaluation Patient Details  Name: Heather Barker MRN: [id number] Date of Birth: January 11, 1930  Today's Date: 03/05/2017 Time: SLP Start Time (ACUTE ONLY): 0955 SLP Stop Time (ACUTE ONLY): 1040 SLP Time Calculation (min) (ACUTE ONLY): 45 min  Past Medical History:  Past Medical History:  Diagnosis Date  . Atrial fibrillation ([place])   . Basal cell cancer 1990's   forehead  . CHF (congestive heart failure) ([place])   . Depression   . Hypertension   . Osteoarthritis   . Psychosis   . Rosacea   . Stenosis, cervical spine   . Urinary incontinence    Past Surgical History:  Past Surgical History:  Procedure Laterality Date  . APPENDECTOMY    . CATARACT EXTRACTION     left eye  . CATARACT EXTRACTION  07/2010   right, IOL  . DILATION AND CURETTAGE OF UTERUS  1970's  . KNEE ARTHROSCOPY  1996   right   . ROTATOR CUFF REPAIR     left torn   HPI:  This is an 83 yo female with a PMH of Urinary Incontinence, Cervical Spine Stenosis, Rosacea, Psychosis, Osteoarthritis, HTN, Depression, Diastolic CHF, Basal Cell Cancer, Hyperlipidemia, Atrial Fibrillation, and Rectal Bleeding secondary to Xarelto.  She presented to [facility] ER from [facility] 03/9 with worsening cough and shortness of breath.  Per ER notes EMS was notified by [facility] and upon arrival the pt was hypoxic with O2 sats 70% on her chronic O2 at 3L. Upon arrival to ER she remained severely hypoxic and was placed on 6L O2 then subsequently placed on Bipap. CXR revealed multifocal pneumonia.  PCCM contacted to admit pt to ICU with acute on chronic hypoxic respiratory failure secondary to multifocal pneumonia requiring continuous Bipap.    Assessment / Plan / Recommendation Clinical Impression  Pt presents w/no apparent oropharyngeal dysphagia. Pt given trials of thin, solid and puree. No overt s/s of aspiration were observed w/any tested consistency. Vocal quality remained clear throughout trials and  laryngeal elevation appeared adequate.  Pt was able to masticate solid adequately and cleared all consistencies well. No pocketing or holding was observed. Oral mech exam revealed oral motor abilities to be [facility]. Pt conversed appropriately w/ST, althought somewhat distractible. Speech was clear. Pt is judged to be mild risk of aspiration d/t history pneumonia but risk is minimized with aspiration precautions, which pt implements without cues. Recommend continue w/regular diet w/thin liquids and general aspiration precautions.  No further ST indicated at this time. Please reconsult if further concerns arise.       Aspiration Risk  Mild aspiration risk    Diet Recommendation Regular;Thin liquid   Liquid Administration via: Cup;Straw Medication Administration: Other (Comment) (Whole meds with liquid or puree, given pt preference) Supervision: Patient able to self feed Compensations: Minimize environmental distractions;Slow rate;Small sips/bites Postural Changes: Seated upright at 90 degrees    Other  Recommendations Oral Care Recommendations: Oral care BID;Patient independent with oral care   Follow up Recommendations None      Frequency and Duration            Prognosis Prognosis for Safe Diet Advancement: Good      Swallow Study   General Date of Onset: 03/04/17 HPI: This is an 83 yo female with a PMH of Urinary Incontinence, Cervical Spine Stenosis, Rosacea, Psychosis, Osteoarthritis, HTN, Depression, Diastolic CHF, Basal Cell Cancer, Hyperlipidemia, Atrial Fibrillation, and Rectal Bleeding secondary to Xarelto.  She presented to [facility] ER from [facility] 03/9 with  worsening cough and shortness of breath.  Per ER notes EMS was notified by [facility] and upon arrival the pt was hypoxic with O2 sats 70% on her chronic O2 at 3L. Upon arrival to ER she remained severely hypoxic and was placed on 6L O2 then subsequently placed on Bipap. CXR revealed multifocal pneumonia.  PCCM contacted  to admit pt to ICU with acute on chronic hypoxic respiratory failure secondary to multifocal pneumonia requiring continuous Bipap.  Type of Study: Bedside Swallow Evaluation Previous Swallow Assessment: None known Diet Prior to this Study: Regular;Thin liquids Temperature Spikes Noted: No Respiratory Status: Nasal cannula History of Recent Intubation: No Behavior/Cognition: Alert;Cooperative;Pleasant mood;Distractible Oral Cavity Assessment: Within Functional Limits Oral Care Completed by SLP: No Oral Cavity - Dentition: Adequate natural dentition Vision: Functional for self-feeding Self-Feeding Abilities: Able to feed self Patient Positioning: Upright in bed Baseline Vocal Quality: Normal Volitional Cough: Strong Volitional Swallow: Able to elicit    Oral/Motor/Sensory Function Overall Oral Motor/Sensory Function: Within functional limits   Ice Chips Ice chips: Not tested   Thin Liquid Thin Liquid: Within functional limits Presentation: Cup;Straw Other Comments: 6 ounces. No overt s/s of aspiration were observed    Nectar Thick Nectar Thick Liquid: Not tested   Honey Thick Honey Thick Liquid: Not tested   Puree Puree: Within functional limits Presentation: Self Fed Other Comments: 2 tsps of puree. No overt s/s of aspiration were observed.    Solid   GO   Solid: Within functional limits Presentation: Self Fed Other Comments: 2 bites of cracker. No overt s/s of aspiration were observed.         Brule,Mairi Stagliano 03/05/2017,10:47 AM

## 2017-03-05 NOTE — Progress Notes (Signed)
Pt is in no distress. Bipap on standby at bedside.

## 2017-03-05 NOTE — Progress Notes (Signed)
PHARMACY - PHYSICIAN COMMUNICATION CRITICAL VALUE ALERT - BLOOD CULTURE IDENTIFICATION (BCID)  Results for orders placed or performed during the hospital encounter of 03/04/17  Blood Culture ID Panel (Reflexed) (Collected: 03/04/2017  6:56 AM)  Result Value Ref Range   Enterococcus species NOT DETECTED NOT DETECTED   Listeria monocytogenes NOT DETECTED NOT DETECTED   Staphylococcus species NOT DETECTED NOT DETECTED   Staphylococcus aureus NOT DETECTED NOT DETECTED   Streptococcus species NOT DETECTED NOT DETECTED   Streptococcus agalactiae NOT DETECTED NOT DETECTED   Streptococcus pneumoniae NOT DETECTED NOT DETECTED   Streptococcus pyogenes NOT DETECTED NOT DETECTED   Acinetobacter baumannii NOT DETECTED NOT DETECTED   Enterobacteriaceae species NOT DETECTED NOT DETECTED   Enterobacter cloacae complex NOT DETECTED NOT DETECTED   Escherichia coli NOT DETECTED NOT DETECTED   Klebsiella oxytoca NOT DETECTED NOT DETECTED   Klebsiella pneumoniae NOT DETECTED NOT DETECTED   Proteus species NOT DETECTED NOT DETECTED   Serratia marcescens NOT DETECTED NOT DETECTED   Haemophilus influenzae NOT DETECTED NOT DETECTED   Neisseria meningitidis NOT DETECTED NOT DETECTED   Pseudomonas aeruginosa NOT DETECTED NOT DETECTED   Candida albicans NOT DETECTED NOT DETECTED   Candida glabrata NOT DETECTED NOT DETECTED   Candida krusei NOT DETECTED NOT DETECTED   Candida parapsilosis NOT DETECTED NOT DETECTED   Candida tropicalis NOT DETECTED NOT DETECTED    Name of physician (or Provider) Contacted: [place]   Changes to prescribed antibiotics required: No change warranted at this time, believed to be contaminant.   Kam Kushnir D 03/05/2017  9:58 PM

## 2017-03-05 NOTE — Progress Notes (Signed)
Patient alert off of bipap. Currently on 3l. No pain or shortness of breath reported at this time. She has been able to take her po, discontinued her Cardizem IV. She has been incontinent. Patient tx 41 report given to [place].

## 2017-03-05 NOTE — NC FL2 (Signed)
[place] LEVEL OF CARE SCREENING TOOL     IDENTIFICATION  Patient Name: Heather Barker Birthdate: 1930/11/30 Sex: female Admission Date (Current Location): 03/04/2017  [place] and [place] Number:  Engineering geologist and Address:  [facility], [address] [place], [place] [address]      Provider Number: [id number]  Attending Physician Name and Address:  Bettey Costa, MD  Relative Name and Phone Number:       Current Level of Care: Hospital Recommended Level of Care: [place] Prior Approval Number:    Date Approved/Denied:   PASRR Number: [id number]  Discharge Plan: SNF    Current Diagnoses: Patient Active Problem List   Diagnosis Date Noted  . Pressure injury of skin 03/05/2017  . Acute respiratory failure ([place]) 03/04/2017  . Acute on chronic respiratory failure ([place]) 01/09/2017  . Aspiration pneumonia ([place]) 12/04/2016  . Pneumonia 12/04/2016  . Dementia with psychosis 01/02/2016  . Hypertension 01/02/2016  . OSTEOARTHRITIS 07/04/2008  . LEG PAIN 09/28/2007  . DEPRESSION 07/20/2007  . HYPERTENSION 07/20/2007  . ROSACEA 07/20/2007  . SPINAL STENOSIS, CERVICAL 07/20/2007  . URINARY INCONTINENCE 07/20/2007    Orientation RESPIRATION BLADDER Height & Weight     Self, Time, Situation  O2 (2L chronic o2) Incontinent Weight: 151 lb 8 oz (68.7 kg) Height:  5\' 2"  (157.5 cm)  BEHAVIORAL SYMPTOMS/MOOD NEUROLOGICAL BOWEL NUTRITION STATUS      Continent Diet (Cardiac healthy)  AMBULATORY STATUS COMMUNICATION OF NEEDS Skin     Verbally Normal                       Personal Care Assistance Level of Assistance  Bathing, Feeding, Dressing Bathing Assistance: Limited assistance Feeding assistance: Independent Dressing Assistance: Limited assistance     Functional Limitations Info             SPECIAL CARE FACTORS FREQUENCY                       Contractures Contractures Info: Present     Additional Factors Info  Code Status, Allergies Code Status Info: Partial code Allergies Info: Codeine Phosphate, Meperidine Hcl, Penicillins           Current Medications (03/05/2017):  This is the current hospital active medication list Current Facility-Administered Medications  Medication Dose Route Frequency Provider Last Rate Last Dose  . aspirin EC tablet 81 mg  81 mg Oral Daily Awilda Bill, NP   81 mg at 03/05/17 1002  . ceFEPIme (MAXIPIME) 1 g in dextrose 5 % 50 mL IVPB  1 g Intravenous Q12H Napoleon Form, RPH   1 g at 03/05/17 1001  . chlorhexidine (PERIDEX) 0.12 % solution 15 mL  15 mL Mouth Rinse BID Wilhelmina Mcardle, MD   15 mL at 03/05/17 1001  . digoxin (LANOXIN) tablet 0.125 mg  0.125 mg Oral Daily Awilda Bill, NP   0.125 mg at 03/05/17 1002  . diltiazem (CARDIZEM CD) 24 hr capsule 240 mg  240 mg Oral Daily Sital Mody, MD   240 mg at 03/05/17 1002  . enoxaparin (LOVENOX) injection 40 mg  40 mg Subcutaneous Q24H Awilda Bill, NP   40 mg at 03/04/17 2107  . furosemide (LASIX) tablet 40 mg  40 mg Oral Daily Bettey Costa, MD   40 mg at 03/05/17 1002  . ipratropium-albuterol (DUONEB) 0.5-2.5 (3) MG/3ML nebulizer solution 3 mL  3 mL  Nebulization Once [place], MD      . ipratropium-albuterol (DUONEB) 0.5-2.5 (3) MG/3ML nebulizer solution 3 mL  3 mL Nebulization Q6H PRN Awilda Bill, NP      . lidocaine (LIDODERM) 5 % 1 patch  1 patch Transdermal Q12H Bettey Costa, MD   1 patch at 03/05/17 1001  . MEDLINE mouth rinse  15 mL Mouth Rinse q12n4p Wilhelmina Mcardle, MD      . metoprolol tartrate (LOPRESSOR) tablet 12.5 mg  12.5 mg Oral BID Awilda Bill, NP   12.5 mg at 03/05/17 1002  . ondansetron (ZOFRAN) injection 4 mg  4 mg Intravenous Q6H PRN Awilda Bill, NP      . QUEtiapine (SEROQUEL) tablet 25 mg  25 mg Oral BID Bettey Costa, MD   25 mg at 03/05/17 1002  . senna-docusate (Senokot-S) tablet 1 tablet  1 tablet Oral BID PRN Awilda Bill, NP          Discharge Medications: Please see discharge summary for a list of discharge medications.  Relevant Imaging Results:  Relevant Lab Results:   Additional Information SS# [id number]  Zettie Pho, LCSW

## 2017-03-05 NOTE — Progress Notes (Signed)
[facility]. She is a/o w/o complaints of pain. Tele applied and verified with central monitoring. Pt oriented to room. Breakfast tray ordered., whic pt tolerated well. No difficulty swallowing food or meds.

## 2017-03-05 NOTE — Clinical Social Work Note (Signed)
Clinical Social Work Assessment  Patient Details  Name: Heather Barker MRN: [id number] Date of Birth: 1930/12/09  Date of referral:  03/05/17               Reason for consult:  Facility Placement                Permission sought to share information with:  Facility Art therapist granted to share information::  Yes, Verbal Permission Granted  Name::        Agency::     Relationship::     Contact Information:     Housing/Transportation Living arrangements for the past 2 months:  [place] of Information:  Patient, Facility Patient Interpreter Needed:  None Criminal Activity/Legal Involvement Pertinent to Current Situation/Hospitalization:  No - Comment as needed Significant Relationships:  Warehouse manager, Friend Lives with:  Facility Resident Do you feel safe going back to the place where you live?  Yes Need for family participation in patient care:  No (Coment)  Care giving concerns:  Patient admitted from [facility]   Social Worker assessment / plan:  CSW met with patient at bedside to discuss dc planning. The patient confirmed that she is a LTC resident at [facility] and would like to return when stable. The patient's emergency contact is a long-time friend, and the patient gave verbal permission to contact her or the facility.  Joseph from [facility] confirms that the patient is a LTC resident and that she can return when stable. The patient is on chronic o2 at 2L and will need EMS transport at the time of dc.   Employment status:  Retired Forensic scientist:  Medicare PT Recommendations:    Information / Referral to community resources:     Patient/Family's Response to care:  The patient thanked the CSW for assistance.  Patient/Family's Understanding of and Emotional Response to Diagnosis, Current Treatment, and Prognosis:  The patient is in agreement with returning to [facility] when stable.  Emotional Assessment Appearance:  Appears stated  age Attitude/Demeanor/Rapport:   (Pleasant and talkative.) Affect (typically observed):  Pleasant, Appropriate Orientation:  Oriented to Self, Oriented to Place, Oriented to  Time, Oriented to Situation Alcohol / Substance use:  Never Used Psych involvement (Current and /or in the community):  No (Comment)  Discharge Needs  Concerns to be addressed:  Care Coordination Readmission within the last 30 days:  No Current discharge risk:  Chronically ill Barriers to Discharge:  Continued Medical Work up   [employer], LCSW 03/05/2017, 11:35 AM

## 2017-03-05 NOTE — Evaluation (Signed)
Physical Therapy Evaluation Patient Details Name: Heather Barker MRN: [id number] DOB: [birth year] Today's Date: 03/05/2017   History of Present Illness  Heather Barker is an 20 white person identifying as female who comes to [facility] from [facility] due to hypoxia while on her chronic O2. PMH of Urinary Incontinence, Cervical Spine Stenosis, Rosacea, Psychosis, Osteoarthritis, HTN, Depression, Diastolic CHF, Basal Cell Cancer, Hyperlipidemia, Atrial Fibrillation, and Rectal Bleeding secondary to Xarelto, Knee arthroscopy (1996); Rotator cuff repair. Pt found to have bilat HCAP.   Clinical Impression  Pt admitted with above diagnosis. Pt currently with functional limitations due to the deficits listed below (see "PT Problem List"). Upon entry, the patient is received semirecumbent in bed, no family/caregiver present.   The pt is awake and agreeable to participate, although quite reluctant due to favoring rest at this time. The pt is alert and oriented x3, pleasant, but easily distracted, somewhat anxious, and uses circumlocution to answer most questions. PLOF taken from PT note at prior admission and confirmed with patient. Pt received on and remaining on 4L O2 throughout evaluation, with noted saturation of >93%, whereas pt is on 3L/min O2 at baseline at [facility] (per ED note). Functional mobility assessment demonstrates moderate-to-severe weakness, the pt requiring Mod-max assist physical assistance for bed mobility and transfers, which is consistent with prior admission and believed to be near-baseline for the patient. The patient demonstrates impulsivity, heightened anxiety, and unsafe behaviors during mobility assessment, all resultant of fear of falling.   Pt will benefit from skilled PT intervention to increase independence and safety with basic mobility in preparation for discharge to the venue listed below.       Follow Up Recommendations Home health PT;Supervision for  mobility/OOB;Supervision/Assistance - 24 hour (Continue with restorative care at [facility] upon return. )    Equipment Recommendations  None recommended by PT    Recommendations for Other Services       Precautions / Restrictions Precautions Precautions: Fall Restrictions Weight Bearing Restrictions: No      Mobility  Bed Mobility Overal bed mobility: Needs Assistance Bed Mobility: Supine to Sit     Supine to sit: Mod assist        Transfers Overall transfer level: Needs assistance Equipment used: None (dependent style transfer. ) Transfers: Sit to/from Stand Sit to Stand: Mod assist;Max assist         General transfer comment: High falls anxiety, poor trunk control with posterior lean, does not correct when cued.   Ambulation/Gait Ambulation/Gait assistance:  (doe snot aMB at baseline)              Financial trader Rankin (Stroke Patients Only)       Balance Overall balance assessment: Needs assistance     Sitting balance - Comments: high falls anxiety comign supine to sitting.    Standing balance support: Bilateral upper extremity supported Standing balance-Leahy Scale: Zero                               Pertinent Vitals/Pain Pain Assessment: No/denies pain    Home Living Family/patient expects to be discharged to:: Assisted living               Home Equipment: Wheelchair - manual Additional Comments: [facility]    Prior Function Level of Independence: Needs assistance   Gait / Transfers Assistance Needed:  Per EMR 1MA: requires assistance for mobility and ADL. Previously, no AMB for several months. Unclear if she has been since prior admission due to circumlocution.            Hand Dominance        Extremity/Trunk Assessment   Upper Extremity Assessment Upper Extremity Assessment: Generalized weakness    Lower Extremity Assessment Lower Extremity Assessment:  Generalized weakness    Cervical / Trunk Assessment Cervical / Trunk Assessment: Kyphotic  Communication   Communication: No difficulties  Cognition Arousal/Alertness: Awake/alert Behavior During Therapy: Anxious (difficulty attending to task. ) Overall Cognitive Status: History of cognitive impairments - at baseline                      General Comments      Exercises     Assessment/Plan    PT Assessment Patient needs continued PT services  PT Problem List Decreased strength;Decreased activity tolerance;Decreased balance;Decreased mobility;Decreased safety awareness;Decreased range of motion       PT Treatment Interventions      PT Goals (Current goals can be found in the Care Plan section)  Acute Rehab PT Goals PT Goal Formulation: Patient unable to participate in goal setting    Frequency Min 2X/week   Barriers to discharge        Co-evaluation               End of Session Equipment Utilized During Treatment: Oxygen Activity Tolerance: Patient limited by fatigue;No increased pain Patient left: in bed;with call bell/phone within reach;with nursing/sitter in room Nurse Communication: Mobility status PT Visit Diagnosis: Unsteadiness on feet (R26.81);Muscle weakness (generalized) (M62.81)         Time: 7026-3785 PT Time Calculation (min) (ACUTE ONLY): 10 min   Charges:   PT Evaluation $PT Eval High Complexity: 1 Procedure     PT G Codes:         1:54 PM, 03/23/17 Heather Barker, PT, DPT Physical Therapist - [place] [phone] (mobile)

## 2017-03-05 NOTE — Progress Notes (Signed)
PULMONARY / CRITICAL CARE MEDICINE   Name: Heather Barker MRN: [id number] DOB: Apr 21, 1930    ADMISSION DATE:  03/04/2017 CONSULTATION DATE:  03/04/2017  REFERRING MD: Dr. Quentin Cornwall   CHIEF COMPLAINT:  Cough   HISTORY OF PRESENT ILLNESS:   This is an 83 yo female with a PMH of Urinary Incontinence, Cervical Spine Stenosis, Rosacea, Psychosis, Osteoarthritis, HTN, Depression, Diastolic CHF, Basal Cell Cancer, Hyperlipidemia, Atrial Fibrillation, and Rectal Bleeding secondary to Xarelto.  She presented to [facility] ER from [facility] 03/9 with worsening cough and shortness of breath.  Per ER notes EMS was notified by [facility] and upon arrival the pt was hypoxic with O2 sats 70% on her chronic O2 at 3L. Upon arrival to ER she remained severely hypoxic and was placed on 6L O2 then subsequently placed on Bipap. CXR revealed multifocal pneumonia.  PCCM contacted to admit pt to ICU with acute on chronic hypoxic respiratory failure secondary to multifocal pneumonia requiring continuous Bipap.   SUBJECTIVE: No acute issues overnight. Patient is off BiPAP as of 3/9 1800. SPO2 and RR stable on 4L [place]. Offers no complaints. Her only concern is that she doesn't want her daughter in-law to visit. Unit secretary notified  VITAL SIGNS: BP (!) 146/82   Pulse 90   Temp 97.1 F (36.2 C)   Resp (!) 25   Ht 5\' 2"  (1.575 m)   Wt 151 lb 8 oz (68.7 kg)   SpO2 97%   BMI 27.71 kg/m   HEMODYNAMICS:    VENTILATOR SETTINGS:    INTAKE / OUTPUT: I/O last 3 completed shifts: In: 110 [I.V.:60; IV Piggyback:50] Out: 150 [Urine:150]  PHYSICAL EXAMINATION: General: chronically ill appearing Caucasian female Neuro: lethargic, follows commands, PERRLA HEENT: supple, no JVD Cardiovascular: irregular, irregular, no M/R/G Lungs: normal WOB, bilateral airflow in all lung fields, diminished in the bases, coarse rhonchi Abdomen: +BS x4, soft, non tender, non distended Musculoskeletal: normal tone 1+ bilateral  lower extremity edema  Skin: scattered reddened rash bilateral lower extremities   LABS:  BMET  Recent Labs Lab 03/04/17 0656 03/05/17 0556  NA 136 138  K 3.9 4.4  CL 104 105  CO2 23 25  BUN 19 17  CREATININE 0.57 0.35*  GLUCOSE 257* 124*    Electrolytes  Recent Labs Lab 03/04/17 0656 03/05/17 0556  CALCIUM 8.5* 8.8*    CBC  Recent Labs Lab 03/04/17 0656 03/05/17 0556  WBC 21.0* 19.0*  HGB 11.9* 11.0*  HCT 37.9 33.2*  PLT 230 221    Coag's No results for input(s): APTT, INR in the last 168 hours.  Sepsis Markers  Recent Labs Lab 03/04/17 0656 03/04/17 0750 03/05/17 0556  LATICACIDVEN  --  3.0*  --   PROCALCITON <0.10  --  0.62    ABG No results for input(s): PHART, PCO2ART, PO2ART in the last 168 hours.  Liver Enzymes No results for input(s): AST, ALT, ALKPHOS, BILITOT, ALBUMIN in the last 168 hours.  Cardiac Enzymes  Recent Labs Lab 03/04/17 0656  TROPONINI <0.03    Glucose  Recent Labs Lab 03/04/17 0958 03/04/17 1321 03/04/17 1546 03/04/17 2026 03/05/17 0109 03/05/17 0338  GLUCAP 176* 181* 135* 180* 113* 114*    Imaging Dg Chest Port 1 View  Result Date: 03/05/2017 CLINICAL DATA:  Acute respiratory failure EXAM: PORTABLE CHEST 1 VIEW COMPARISON:  Chest x-rays dated 03/04/2017 and 01/09/2017. FINDINGS: Cardiomegaly appears stable. Atherosclerotic changes noted at the aortic arch. Perihilar pulmonary edema pattern is stable. Probable small bilateral  pleural effusions and bibasilar atelectasis. No new lung abnormality seen. No pneumothorax seen. IMPRESSION: 1. No significant interval change. Perihilar airspace opacities are stable, most suggestive of CHF/pulmonary edema. Probable small bilateral pleural effusions with adjacent atelectasis. 2. Aortic atherosclerosis. Electronically Signed   By: Franki Cabot M.D.   On: 03/05/2017 07:02   Dg Chest Portable 1 View  Result Date: 03/04/2017 CLINICAL DATA:  Shortness of breath and  cough. EXAM: PORTABLE CHEST 1 VIEW COMPARISON:  01/09/2017 FINDINGS: Increased patchy densities in the right hilum and mid right lung. Improved aeration in the left upper lung compared to the previous examination. Densities at the left lung base concerning for pleural fluid and/or consolidation. Heart size appears to be enlarged and stable. Atherosclerotic calcifications at the aortic arch. IMPRESSION: Bilateral patchy lung densities. Worsening disease in the right lung with some improvement in the left upper lung. Findings are concerning for airspace disease and pneumonia. Probable pleural effusions. Electronically Signed   By: Markus Daft M.D.   On: 03/04/2017 07:41   STUDIES:  None   CULTURES: Blood x2 03/9>>  ANTIBIOTICS: Cefepime 03/9>> Vancomycin 03/9>>    LINES/TUBES: PIV's>>  ASSESSMENT / PLAN: Acute on chronic hypoxic respiratory failure] Multifocal pneumonia - HCAP, NOS Chronic atrial fibrillation-rate controlled Hx: HTN P:    Supplemental O2 Cont current abx Follow micro results and narrow abx as able Should get F/U CXR at discharge Advance diet as able Change to PO diltiazem and discontinue diltiazem infusion after two oral doses  Continue outpatient aspirin, digoxin, and metoprolol  Monitor BMET intermittently Monitor I/Os Correct electrolytes as indicated DC SSI (hyperglycemia resolved and no hx of DM) Transfer to telemetry bed [facility] to assume care after transfer  PCCM will sign off. Please call if we can be of further assistance ACNP T[place]ov discussed with Dr Alisia Ferrari, MD PCCM service Mobile [phone] Pager [phone] 03/05/2017

## 2017-03-05 NOTE — Progress Notes (Signed)
[place] at [place] NAME: Heather Barker    MR#:  [id number]  DATE OF BIRTH:  1930/02/14  SUBJECTIVE:  Doing well this am Very chatty  REVIEW OF SYSTEMS:    Review of Systems  Constitutional: Negative.  Negative for chills, fever and malaise/fatigue.  HENT: Negative.  Negative for ear discharge, ear pain, hearing loss, nosebleeds and sore throat.   Eyes: Negative.  Negative for blurred vision and pain.  Respiratory: Negative.  Negative for cough, hemoptysis, shortness of breath and wheezing.   Cardiovascular: Negative.  Negative for chest pain, palpitations and leg swelling.  Gastrointestinal: Negative.  Negative for abdominal pain, blood in stool, diarrhea, nausea and vomiting.  Genitourinary: Negative.  Negative for dysuria.  Musculoskeletal: Negative.  Negative for back pain.  Skin: Negative.   Neurological: Negative for dizziness, tremors, speech change, focal weakness, seizures and headaches.  Endo/Heme/Allergies: Negative.  Does not bruise/bleed easily.  Psychiatric/Behavioral: Negative.  Negative for depression, hallucinations and suicidal ideas.    Tolerating Diet: yes      DRUG ALLERGIES:   Allergies  Allergen Reactions  . Codeine Phosphate     REACTION: intolerance--hydrocodone okay  . Meperidine Hcl     REACTION: unspecified  . Penicillins     VITALS:  Blood pressure (!) 142/86, pulse 87, temperature 98.5 F (36.9 C), temperature source Oral, resp. rate 18, height 5\' 2"  (1.575 m), weight 68.7 kg (151 lb 8 oz), SpO2 98 %.  PHYSICAL EXAMINATION:   Physical Exam  Constitutional: She is oriented to person, place, and time and well-developed, well-nourished, and in no distress. No distress.  HENT:  Head: Normocephalic.  Eyes: No scleral icterus.  Neck: Normal range of motion. Neck supple. No JVD present. No tracheal deviation present.  Cardiovascular: Normal rate and regular rhythm.  Exam reveals no gallop and no  friction rub.   Murmur heard. Irr, irr  Pulmonary/Chest: Effort normal. No respiratory distress. She has no wheezes. She has rales. She exhibits no tenderness.  Abdominal: Soft. Bowel sounds are normal. She exhibits no distension and no mass. There is no tenderness. There is no rebound and no guarding.  Musculoskeletal: Normal range of motion. She exhibits no edema.  Neurological: She is alert and oriented to person, place, and time.  Skin: Skin is warm. No rash noted. No erythema.  Psychiatric: Affect and judgment normal.      LABORATORY PANEL:   CBC  Recent Labs Lab 03/05/17 0556  WBC 19.0*  HGB 11.0*  HCT 33.2*  PLT 221   ------------------------------------------------------------------------------------------------------------------  Chemistries   Recent Labs Lab 03/05/17 0556  NA 138  K 4.4  CL 105  CO2 25  GLUCOSE 124*  BUN 17  CREATININE 0.35*  CALCIUM 8.8*   ------------------------------------------------------------------------------------------------------------------  Cardiac Enzymes  Recent Labs Lab 03/04/17 0656  TROPONINI <0.03   ------------------------------------------------------------------------------------------------------------------  RADIOLOGY:  Dg Chest Port 1 View  Result Date: 03/05/2017 CLINICAL DATA:  Acute respiratory failure EXAM: PORTABLE CHEST 1 VIEW COMPARISON:  Chest x-rays dated 03/04/2017 and 01/09/2017. FINDINGS: Cardiomegaly appears stable. Atherosclerotic changes noted at the aortic arch. Perihilar pulmonary edema pattern is stable. Probable small bilateral pleural effusions and bibasilar atelectasis. No new lung abnormality seen. No pneumothorax seen. IMPRESSION: 1. No significant interval change. Perihilar airspace opacities are stable, most suggestive of CHF/pulmonary edema. Probable small bilateral pleural effusions with adjacent atelectasis. 2. Aortic atherosclerosis. Electronically Signed   By: Franki Cabot M.D.    On: 03/05/2017 07:02  Dg Chest Portable 1 View  Result Date: 03/04/2017 CLINICAL DATA:  Shortness of breath and cough. EXAM: PORTABLE CHEST 1 VIEW COMPARISON:  01/09/2017 FINDINGS: Increased patchy densities in the right hilum and mid right lung. Improved aeration in the left upper lung compared to the previous examination. Densities at the left lung base concerning for pleural fluid and/or consolidation. Heart size appears to be enlarged and stable. Atherosclerotic calcifications at the aortic arch. IMPRESSION: Bilateral patchy lung densities. Worsening disease in the right lung with some improvement in the left upper lung. Findings are concerning for airspace disease and pneumonia. Probable pleural effusions. Electronically Signed   By: Markus Daft M.D.   On: 03/04/2017 07:41     ASSESSMENT AND PLAN:    83 year old female with a history of atrial fibrillation, rectal bleeding due to anticoagulation, chronic diastolic heart failure and discharged from the hospital in early January for  pneumonia presents with sepsis and acute on chronic hypoxic respiratory failure.  1. Sepsis with leukocytosis and tachypnea due to HCAP Continue cefepime. MRSA PCR is negative therefore we'll discontinue vancomycin Blood cultures negative to date  2. Acute on chronic hypoxic respiratory failure with baseline oxygen requirement 2-3 L nasal cannula: Patient is now off BiPAP Respiratory failure due to combination of HCAP, atrial fibrillation RVR as well as acute on chronic diastolic heart failure   3. Acute on chronic diastolic heart failure: His symptoms have resolved with BiPAP Start low-dose oral Lasix  4. Atrial fibrillation: Continue digoxin, diltiazem and metoprolol   Management plans discussed with the patient and she is in agreement.  CODE STATUS: partial  TOTAL TIME TAKING CARE OF THIS PATIENT: 30 minutes.  Physical therapy and clinical social worker consultation for disposition   POSSIBLE D/C  tomorrow, DEPENDING ON CLINICAL CONDITION.   Rayquan Amrhein M.D on 03/05/2017 at 8:46 AM  Between 7am to 6pm - Pager - [phone] After 6pm go to [email or web address] - password [email or web address] [facility]  Office  [phone]  CC: Primary care physician; Juluis Pitch, MD  Note: This dictation was prepared with Dragon dictation along with smaller phrase technology. Any transcriptional errors that result from this process are unintentional.

## 2017-03-06 DIAGNOSIS — I5031 Acute diastolic (congestive) heart failure: Secondary | ICD-10-CM

## 2017-03-06 LAB — CBC
HCT: 34.3 % — ABNORMAL LOW (ref 35.0–47.0)
Hemoglobin: 11.3 g/dL — ABNORMAL LOW (ref 12.0–16.0)
MCH: 29.5 pg (ref 26.0–34.0)
MCHC: 33 g/dL (ref 32.0–36.0)
MCV: 89.5 fL (ref 80.0–100.0)
PLATELETS: 204 10*3/uL (ref 150–440)
RBC: 3.83 MIL/uL (ref 3.80–5.20)
RDW: 16.7 % — AB (ref 11.5–14.5)
WBC: 15.5 10*3/uL — AB (ref 3.6–11.0)

## 2017-03-06 LAB — BASIC METABOLIC PANEL
ANION GAP: 6 (ref 5–15)
BUN: 27 mg/dL — AB (ref 6–20)
CALCIUM: 8.7 mg/dL — AB (ref 8.9–10.3)
CO2: 28 mmol/L (ref 22–32)
CREATININE: 0.61 mg/dL (ref 0.44–1.00)
Chloride: 105 mmol/L (ref 101–111)
GFR calc Af Amer: >60 mL/min (ref >60)
GLUCOSE: 108 mg/dL — AB (ref 65–99)
Potassium: 3.8 mmol/L (ref 3.5–5.1)
Sodium: 139 mmol/L (ref 135–145)

## 2017-03-06 MED ORDER — LEVOFLOXACIN 750 MG PO TABS: 750.0000 mg | ORAL_TABLET | Freq: Every day | ORAL | 0 refills | Status: DC

## 2017-03-06 MED ORDER — FUROSEMIDE 20 MG PO TABS: 20.0000 mg | ORAL_TABLET | Freq: Every day | ORAL | 0 refills | Status: DC

## 2017-03-06 NOTE — Clinical Social Work Note (Signed)
Patient to dc to [facility] via non-emergent EMS. The patient and facility are aware. Documentation has been sent to the facility. CSW will con't to follow pending additional dc needs.  Santiago Bumpers, MSW, Latanya Presser [phone]

## 2017-03-06 NOTE — Progress Notes (Signed)
Pt to be discharged to [facility] today. Iv and tele removed. Report called to the facility. Pt to be transported by ems.

## 2017-03-06 NOTE — Discharge Summary (Signed)
[place] at [place] NAME: Heather Barker    MR#:  [id number]  DATE OF BIRTH:  [birth year]  DATE OF ADMISSION:  03/04/2017 ADMITTING PHYSICIAN: Wilhelmina Mcardle, MD  DATE OF DISCHARGE: 03/06/2017  PRIMARY CARE PHYSICIAN: Juluis Pitch, MD    ADMISSION DIAGNOSIS:  Acute respiratory failure with hypoxia (HCC) [J96.01] Sepsis, due to unspecified organism ([place]) [A41.9] Pneumonia of right middle lobe due to infectious organism ([place]) [J18.1]  DISCHARGE DIAGNOSIS:  Active Problems: sepsis   Acute respiratory failure (HCC)   Pressure injury of skin   Acute diastolic heart failure ([place])   SECONDARY DIAGNOSIS:   Past Medical History:  Diagnosis Date  . Atrial fibrillation ([place])   . Basal cell cancer 1990's   forehead  . CHF (congestive heart failure) ([place])   . Depression   . Hypertension   . Osteoarthritis   . Psychosis   . Rosacea   . Stenosis, cervical spine   . Urinary incontinence     HOSPITAL COURSE:   83 year old female with a history of atrial fibrillation, rectal bleeding due to anticoagulation, chronic diastolic heart failure and discharged from the hospital in early January for  pneumonia presents with sepsis and acute on chronic hypoxic respiratory failure.  1. Sepsis with leukocytosis and tachypnea due to HCAP She will be discharged on Levaquin. It should be noted that only one out of 3 blood cultures showed variable gram-negative rod and anaerobic bottle which is a contaminant.  MRSA PCR is negative therefore vancomycin was discontinued.   2. Acute on chronic hypoxic respiratory failure with baseline oxygen requirement 2-3 L nasal cannula:  Respiratory failure due to combination of HCAP, atrial fibrillation RVR as well as acute on chronic diastolic heart failure She was transitioned from BiPAP to baseline nasal cannula.  3. Acute on chronic diastolic heart failure: His symptoms have resolved with BiPAP He is  started on low-dose Lasix.  4. Atrial fibrillation: Continue digoxin, diltiazem and metoprolol   DISCHARGE CONDITIONS AND DIET:   STable for discharge on heart healthy diet  CONSULTS OBTAINED:    DRUG ALLERGIES:   Allergies  Allergen Reactions  . Codeine Phosphate     REACTION: intolerance--hydrocodone okay  . Meperidine Hcl     REACTION: unspecified  . Penicillins     DISCHARGE MEDICATIONS:   Current Discharge Medication List    START taking these medications   Details  furosemide (LASIX) 20 MG tablet Take 1 tablet (20 mg total) by mouth daily. Qty: 30 tablet, Refills: 0    levofloxacin (LEVAQUIN) 750 MG tablet Take 1 tablet (750 mg total) by mouth daily. Qty: 5 tablet, Refills: 0      CONTINUE these medications which have NOT CHANGED   Details  acetaminophen (TYLENOL) 500 MG tablet Take 500 mg by mouth 2 (two) times daily.     aspirin EC 81 MG tablet Take 81 mg by mouth daily.    Biotin 10 MG TABS Take 10 mg by mouth daily.    digoxin (LANOXIN) 0.125 MG tablet Take 0.125 mg by mouth daily.    diltiazem (CARDIZEM CD) 240 MG 24 hr capsule Take 1 capsule (240 mg total) by mouth daily. Qty: 30 capsule, Refills: 0    ipratropium-albuterol (DUONEB) 0.5-2.5 (3) MG/3ML SOLN Take 3 mLs by nebulization every 6 (six) hours as needed. Qty: 360 mL, Refills: 0    lidocaine (LIDODERM) 5 % Place 1 patch onto the skin every 12 (twelve) hours. Remove &  Discard patch within 12 hours or as directed by MD    meclizine (ANTIVERT) 12.5 MG tablet Take 12.5 mg by mouth 3 (three) times daily. At 0800, 1400, and 2000    Menthol, Topical Analgesic, (BIOFREEZE) 4 % GEL Apply 1 application topically 2 (two) times daily.    metoprolol tartrate (LOPRESSOR) 25 MG tablet Take 0.5 tablets (12.5 mg total) by mouth 2 (two) times daily. Qty: 60 tablet, Refills: 0    metroNIDAZOLE (METROGEL) 0.75 % gel Apply 1 application topically 2 (two) times daily as needed.     Multiple Vitamin  (MULTIVITAMIN) tablet Take 1 tablet by mouth daily.    ondansetron (ZOFRAN) 4 MG tablet Take 4 mg by mouth every 4 (four) hours as needed for nausea or vomiting.    QUEtiapine (SEROQUEL) 25 MG tablet Take 1 tablet (25 mg total) by mouth 2 (two) times daily. Qty: 60 tablet, Refills: 0    senna-docusate (SENOKOT-S) 8.6-50 MG tablet Take 1 tablet by mouth 2 (two) times daily as needed for mild constipation.    Miconazole Nitrate 2 % AERP Apply topically. Apply topically to under breast and groin once a day 1500-2300          Today   CHIEF COMPLAINT:  Doing well this am wants to go to SNF   VITAL SIGNS:  Blood pressure 122/76, pulse 75, temperature 97.7 F (36.5 C), temperature source Oral, resp. rate 18, height 5\' 2"  (1.575 m), weight 68.7 kg (151 lb 8 oz), SpO2 99 %.   REVIEW OF SYSTEMS:  Review of Systems  Constitutional: Negative.  Negative for chills, fever and malaise/fatigue.  HENT: Negative.  Negative for ear discharge, ear pain, hearing loss, nosebleeds and sore throat.   Eyes: Negative.  Negative for blurred vision and pain.  Respiratory: Negative.  Negative for cough, hemoptysis, shortness of breath and wheezing.   Cardiovascular: Negative.  Negative for chest pain, palpitations and leg swelling.  Gastrointestinal: Negative.  Negative for abdominal pain, blood in stool, diarrhea, nausea and vomiting.  Genitourinary: Negative.  Negative for dysuria.  Musculoskeletal: Negative.  Negative for back pain.  Skin: Negative.   Neurological: Negative for dizziness, tremors, speech change, focal weakness, seizures and headaches.  Endo/Heme/Allergies: Negative.  Does not bruise/bleed easily.  Psychiatric/Behavioral: Negative.  Negative for depression, hallucinations and suicidal ideas.     PHYSICAL EXAMINATION:  GENERAL:  83 y.o.-year-old patient lying in the bed with no acute distress.  NECK:  Supple, no jugular venous distention. No thyroid enlargement, no tenderness.   LUNGS: Normal breath sounds bilaterally, no wheezing, rales,rhonchi  No use of accessory muscles of respiration.  CARDIOVASCULAR: irr, irr 3.6 SEM NO  rubs, or gallops.  ABDOMEN: Soft, non-tender, non-distended. Bowel sounds present. No organomegaly or mass.  EXTREMITIES: No pedal edema, cyanosis, or clubbing.  PSYCHIATRIC: The patient is alert and oriented x 3.  SKIN: No obvious rash, lesion, or ulcer.   DATA REVIEW:   CBC  Recent Labs Lab 03/06/17 0445  WBC 15.5*  HGB 11.3*  HCT 34.3*  PLT 204    Chemistries   Recent Labs Lab 03/06/17 0445  NA 139  K 3.8  CL 105  CO2 28  GLUCOSE 108*  BUN 27*  CREATININE 0.61  CALCIUM 8.7*    Cardiac Enzymes  Recent Labs Lab 03/04/17 0656  TROPONINI <0.03    Microbiology Results  @[email or web address] @  RADIOLOGY:  Dg Chest Port 1 View  Result Date: 03/05/2017 CLINICAL DATA:  Acute respiratory failure EXAM: PORTABLE CHEST  1 VIEW COMPARISON:  Chest x-rays dated 03/04/2017 and 01/09/2017. FINDINGS: Cardiomegaly appears stable. Atherosclerotic changes noted at the aortic arch. Perihilar pulmonary edema pattern is stable. Probable small bilateral pleural effusions and bibasilar atelectasis. No new lung abnormality seen. No pneumothorax seen. IMPRESSION: 1. No significant interval change. Perihilar airspace opacities are stable, most suggestive of CHF/pulmonary edema. Probable small bilateral pleural effusions with adjacent atelectasis. 2. Aortic atherosclerosis. Electronically Signed   By: Franki Cabot M.D.   On: 03/05/2017 07:02      Current Discharge Medication List    START taking these medications   Details  furosemide (LASIX) 20 MG tablet Take 1 tablet (20 mg total) by mouth daily. Qty: 30 tablet, Refills: 0    levofloxacin (LEVAQUIN) 750 MG tablet Take 1 tablet (750 mg total) by mouth daily. Qty: 5 tablet, Refills: 0      CONTINUE these medications which have NOT CHANGED   Details  acetaminophen (TYLENOL) 500 MG tablet  Take 500 mg by mouth 2 (two) times daily.     aspirin EC 81 MG tablet Take 81 mg by mouth daily.    Biotin 10 MG TABS Take 10 mg by mouth daily.    digoxin (LANOXIN) 0.125 MG tablet Take 0.125 mg by mouth daily.    diltiazem (CARDIZEM CD) 240 MG 24 hr capsule Take 1 capsule (240 mg total) by mouth daily. Qty: 30 capsule, Refills: 0    ipratropium-albuterol (DUONEB) 0.5-2.5 (3) MG/3ML SOLN Take 3 mLs by nebulization every 6 (six) hours as needed. Qty: 360 mL, Refills: 0    lidocaine (LIDODERM) 5 % Place 1 patch onto the skin every 12 (twelve) hours. Remove & Discard patch within 12 hours or as directed by MD    meclizine (ANTIVERT) 12.5 MG tablet Take 12.5 mg by mouth 3 (three) times daily. At 0800, 1400, and 2000    Menthol, Topical Analgesic, (BIOFREEZE) 4 % GEL Apply 1 application topically 2 (two) times daily.    metoprolol tartrate (LOPRESSOR) 25 MG tablet Take 0.5 tablets (12.5 mg total) by mouth 2 (two) times daily. Qty: 60 tablet, Refills: 0    metroNIDAZOLE (METROGEL) 0.75 % gel Apply 1 application topically 2 (two) times daily as needed.     Multiple Vitamin (MULTIVITAMIN) tablet Take 1 tablet by mouth daily.    ondansetron (ZOFRAN) 4 MG tablet Take 4 mg by mouth every 4 (four) hours as needed for nausea or vomiting.    QUEtiapine (SEROQUEL) 25 MG tablet Take 1 tablet (25 mg total) by mouth 2 (two) times daily. Qty: 60 tablet, Refills: 0    senna-docusate (SENOKOT-S) 8.6-50 MG tablet Take 1 tablet by mouth 2 (two) times daily as needed for mild constipation.    Miconazole Nitrate 2 % AERP Apply topically. Apply topically to under breast and groin once a day 1500-2300           Management plans discussed with the patient and she is in agreement. Stable for discharge   Patient should follow up with pcp  CODE STATUS:     Code Status Orders        Start     Ordered   03/04/17 1120  Limited resuscitation (code)  Continuous    Question Answer Comment  In the  event of cardiac or respiratory ARREST: Initiate Code Blue, Call Rapid Response No   In the event of cardiac or respiratory ARREST: Perform CPR No   In the event of cardiac or respiratory ARREST: Perform Intubation/Mechanical Ventilation  No   In the event of cardiac or respiratory ARREST: Use NIPPV/BiPAp only if indicated Yes   In the event of cardiac or respiratory ARREST: Administer ACLS medications if indicated Yes   In the event of cardiac or respiratory ARREST: Perform Defibrillation or Cardioversion if indicated Yes      03/04/17 1122    Code Status History    Date Active Date Inactive Code Status Order ID Comments User Context   01/09/2017 12:14 PM 01/11/2017  7:57 PM Partial Code [id number]  Vaughan Basta, MD Inpatient   12/04/2016  9:42 AM 12/07/2016 12:52 AM Full Code [id number]  Saundra Shelling, MD Inpatient   12/04/2016  6:46 AM 12/04/2016  9:41 AM Full Code [id number]  Saundra Shelling, MD ED      TOTAL TIME TAKING CARE OF THIS PATIENT: 37 minutes.    Note: This dictation was prepared with Dragon dictation along with smaller phrase technology. Any transcriptional errors that result from this process are unintentional.  Mihcael Ledee M.D on 03/06/2017 at 8:03 AM  Between 7am to 6pm - Pager - [phone] After 6pm go to [email or web address] - password [email or web address] [facility]  Office  [phone]  CC: Primary care physician; Juluis Pitch, MD

## 2017-03-06 NOTE — Plan of Care (Signed)
Problem: Fluid Volume: Goal: Ability to maintain a balanced intake and output will improve Outcome: Progressing Voiding incontinently this shift.  Consuming minimal po intake this shift.

## 2017-03-08 ENCOUNTER — Other Ambulatory Visit: Payer: Self-pay | Admitting: *Deleted

## 2017-03-08 DIAGNOSIS — B379 Candidiasis, unspecified: Secondary | ICD-10-CM | POA: Diagnosis not present

## 2017-03-08 DIAGNOSIS — K5900 Constipation, unspecified: Secondary | ICD-10-CM | POA: Diagnosis not present

## 2017-03-08 DIAGNOSIS — R498 Other voice and resonance disorders: Secondary | ICD-10-CM | POA: Diagnosis not present

## 2017-03-08 DIAGNOSIS — R1312 Dysphagia, oropharyngeal phase: Secondary | ICD-10-CM | POA: Diagnosis not present

## 2017-03-08 DIAGNOSIS — G8911 Acute pain due to trauma: Secondary | ICD-10-CM | POA: Diagnosis not present

## 2017-03-08 DIAGNOSIS — E569 Vitamin deficiency, unspecified: Secondary | ICD-10-CM | POA: Diagnosis not present

## 2017-03-08 DIAGNOSIS — R41841 Cognitive communication deficit: Secondary | ICD-10-CM | POA: Diagnosis not present

## 2017-03-08 DIAGNOSIS — F0151 Vascular dementia with behavioral disturbance: Secondary | ICD-10-CM | POA: Diagnosis not present

## 2017-03-08 DIAGNOSIS — I4891 Unspecified atrial fibrillation: Secondary | ICD-10-CM | POA: Diagnosis not present

## 2017-03-08 DIAGNOSIS — I10 Essential (primary) hypertension: Secondary | ICD-10-CM | POA: Diagnosis not present

## 2017-03-08 NOTE — Patient Outreach (Signed)
[facility]) Care Management  03/08/2017  Heather Barker 1930/06/22 [id number]   Notified by staff at facility, patient is LTC with no plans to discharge at this time. No [facility] care management needs assessed.  Plan to sign off. [name]. [name], RN, BSN, [place] [phone]) Business Cell  [phone]) Toll Free Office

## 2017-03-09 LAB — CULTURE, BLOOD (ROUTINE X 2): CULTURE: NO GROWTH

## 2017-03-13 DIAGNOSIS — I10 Essential (primary) hypertension: Secondary | ICD-10-CM | POA: Diagnosis not present

## 2017-03-13 DIAGNOSIS — R42 Dizziness and giddiness: Secondary | ICD-10-CM | POA: Diagnosis not present

## 2017-03-13 DIAGNOSIS — M6281 Muscle weakness (generalized): Secondary | ICD-10-CM | POA: Diagnosis not present

## 2017-03-13 DIAGNOSIS — Z9981 Dependence on supplemental oxygen: Secondary | ICD-10-CM | POA: Diagnosis not present

## 2017-03-13 DIAGNOSIS — J9620 Acute and chronic respiratory failure, unspecified whether with hypoxia or hypercapnia: Secondary | ICD-10-CM | POA: Diagnosis not present

## 2017-03-13 DIAGNOSIS — M549 Dorsalgia, unspecified: Secondary | ICD-10-CM | POA: Diagnosis not present

## 2017-03-13 DIAGNOSIS — I5033 Acute on chronic diastolic (congestive) heart failure: Secondary | ICD-10-CM | POA: Diagnosis not present

## 2017-03-13 DIAGNOSIS — I482 Chronic atrial fibrillation: Secondary | ICD-10-CM | POA: Diagnosis not present

## 2017-03-14 ENCOUNTER — Other Ambulatory Visit
Admission: RE | Admit: 2017-03-14 | Discharge: 2017-03-14 | Disposition: A | Discharge status: Home / Self Care | Payer: Medicare Other | Source: Ambulatory Visit | Attending: Family Medicine | Admitting: Family Medicine

## 2017-03-14 DIAGNOSIS — I10 Essential (primary) hypertension: Secondary | ICD-10-CM | POA: Insufficient documentation

## 2017-03-14 DIAGNOSIS — R531 Weakness: Secondary | ICD-10-CM | POA: Diagnosis not present

## 2017-03-14 LAB — BRAIN NATRIURETIC PEPTIDE: B Natriuretic Peptide: 248 pg/mL — ABNORMAL HIGH (ref 0.0–100.0)

## 2017-03-17 ENCOUNTER — Ambulatory Visit: Payer: Medicare Other | Attending: Family | Admitting: Family

## 2017-03-17 ENCOUNTER — Encounter: Payer: Self-pay | Admitting: Family

## 2017-03-17 VITALS — BP 129/83 | HR 76 | Resp 18 | Ht 62.0 in | Wt 124.0 lb

## 2017-03-17 DIAGNOSIS — I5032 Chronic diastolic (congestive) heart failure: Secondary | ICD-10-CM | POA: Insufficient documentation

## 2017-03-17 DIAGNOSIS — I4891 Unspecified atrial fibrillation: Secondary | ICD-10-CM | POA: Insufficient documentation

## 2017-03-17 DIAGNOSIS — Z7982 Long term (current) use of aspirin: Secondary | ICD-10-CM | POA: Insufficient documentation

## 2017-03-17 DIAGNOSIS — Z79899 Other long term (current) drug therapy: Secondary | ICD-10-CM | POA: Diagnosis not present

## 2017-03-17 DIAGNOSIS — M1990 Unspecified osteoarthritis, unspecified site: Secondary | ICD-10-CM | POA: Diagnosis not present

## 2017-03-17 DIAGNOSIS — Z885 Allergy status to narcotic agent status: Secondary | ICD-10-CM | POA: Diagnosis not present

## 2017-03-17 DIAGNOSIS — F329 Major depressive disorder, single episode, unspecified: Secondary | ICD-10-CM | POA: Insufficient documentation

## 2017-03-17 DIAGNOSIS — M4802 Spinal stenosis, cervical region: Secondary | ICD-10-CM | POA: Insufficient documentation

## 2017-03-17 DIAGNOSIS — Z880 Allergy status to penicillin: Secondary | ICD-10-CM | POA: Diagnosis not present

## 2017-03-17 DIAGNOSIS — Z801 Family history of malignant neoplasm of trachea, bronchus and lung: Secondary | ICD-10-CM | POA: Diagnosis not present

## 2017-03-17 DIAGNOSIS — Z85828 Personal history of other malignant neoplasm of skin: Secondary | ICD-10-CM | POA: Insufficient documentation

## 2017-03-17 DIAGNOSIS — I10 Essential (primary) hypertension: Secondary | ICD-10-CM

## 2017-03-17 DIAGNOSIS — I110 Hypertensive heart disease with heart failure: Secondary | ICD-10-CM | POA: Diagnosis not present

## 2017-03-17 DIAGNOSIS — Z8249 Family history of ischemic heart disease and other diseases of the circulatory system: Secondary | ICD-10-CM | POA: Insufficient documentation

## 2017-03-17 DIAGNOSIS — L719 Rosacea, unspecified: Secondary | ICD-10-CM | POA: Diagnosis not present

## 2017-03-17 NOTE — Patient Instructions (Signed)
Begin weighing daily and call for an overnight weight gain of > 2 pounds or a weekly weight gain of >5 pounds. 

## 2017-03-17 NOTE — Progress Notes (Signed)
Patient ID: Heather Barker, female    DOB: 11-15-1930, 83 y.o.   MRN: [id number]  HPI  [name] is a 83 y/o female with a history of cervical stenosis, osteoarthritis, HTN, depression, atrial fibrillation and chronic heart failure.   Last echo was done 12/05/16 and showed an EF of 60-65% along with trivial AR, moderate MR and mod/severe TR.   Admitted 03/04/17 with sepsis due to pneumonia. Begun on levaquin. Initially needed bipap but then transitioned to nasal cannula. Discharged to SNF. Admitted 01/09/17 with acute on chronic respiratory failure secondary to pneumonia. Continued supplemental oxygen and IV antibiotics. Tested negative for influenza. Tested positive for MRSA. Had speech evaluation. Discharged to SNF. Admitted 12/04/16 with pneumonia and acute on chronic HF. Given broad spectrum antibiotics and IV diuretics. Discharged to SNF.   She presents today for her initial visit with fatigue and shortness of breath with minimal exertion. Admits that she really doesn't do much and how quickly she gets short of breath depends on how quickly the aide at SNF pushes her to finish the task. Has minimal edema in lower legs and she does wear support socks. Feels like she's getting weighed every other day. Using Mrs. Dash. Reports not liking being at [facility].   Past Medical History:  Diagnosis Date  . Atrial fibrillation ([place])   . Basal cell cancer 1990's   forehead  . CHF (congestive heart failure) ([place])   . Depression   . Hypertension   . Osteoarthritis   . Psychosis   . Rosacea   . Stenosis, cervical spine   . Urinary incontinence    Past Surgical History:  Procedure Laterality Date  . APPENDECTOMY    . CATARACT EXTRACTION     left eye  . CATARACT EXTRACTION  07/2010   right, IOL  . DILATION AND CURETTAGE OF UTERUS  1970's  . KNEE ARTHROSCOPY  1996   right   . ROTATOR CUFF REPAIR     left torn   Family History  Problem Relation Age of Onset  . Cancer Mother    lung  . Hypertension Mother   . Heart disease Father   . Hypertension Father   . Diabetes Neg Hx    Social History  Substance Use Topics  . Smoking status: Never Smoker  . Smokeless tobacco: Never Used  . Alcohol use Yes     Comment: occasional   Allergies  Allergen Reactions  . Codeine Phosphate     REACTION: intolerance--hydrocodone okay  . Meperidine Hcl     REACTION: unspecified  . Penicillins    Prior to Admission medications   Medication Sig Start Date End Date Taking? Authorizing Provider  acetaminophen (TYLENOL) 500 MG tablet Take 500 mg by mouth 2 (two) times daily.    Yes Historical Provider, MD  aspirin EC 81 MG tablet Take 81 mg by mouth daily.   Yes Historical Provider, MD  Biotin 10 MG TABS Take 10 mg by mouth daily.   Yes Historical Provider, MD  digoxin (LANOXIN) 0.125 MG tablet Take 0.125 mg by mouth daily.   Yes Historical Provider, MD  diltiazem (CARDIZEM CD) 240 MG 24 hr capsule Take 1 capsule (240 mg total) by mouth daily. 12/07/16  Yes Vaughan Basta, MD  furosemide (LASIX) 20 MG tablet Take 1 tablet (20 mg total) by mouth daily. 03/06/17  Yes Sital Mody, MD  ipratropium-albuterol (DUONEB) 0.5-2.5 (3) MG/3ML SOLN Take 3 mLs by nebulization every 6 (six) hours as needed. 01/11/17  Yes Nicholes Mango, MD  lidocaine (LIDODERM) 5 % Place 1 patch onto the skin every 12 (twelve) hours. Remove & Discard patch within 12 hours or as directed by MD   Yes Historical Provider, MD  meclizine (ANTIVERT) 12.5 MG tablet Take 12.5 mg by mouth 3 (three) times daily. At 0800, 1400, and 2000   Yes Historical Provider, MD  Menthol, Topical Analgesic, (BIOFREEZE) 4 % GEL Apply 1 application topically 2 (two) times daily.   Yes Historical Provider, MD  metoprolol tartrate (LOPRESSOR) 25 MG tablet Take 0.5 tablets (12.5 mg total) by mouth 2 (two) times daily. 12/06/16  Yes Vaughan Basta, MD  metroNIDAZOLE (METROGEL) 1 % gel Apply 1 application topically 2 (two) times daily  as needed.    Yes Historical Provider, MD  Miconazole Nitrate 2 % AERP Apply topically. Apply topically to under breast and groin once a day 1500-2300   Yes Historical Provider, MD  Multiple Vitamin (MULTIVITAMIN) tablet Take 1 tablet by mouth daily.   Yes Historical Provider, MD  ondansetron (ZOFRAN) 4 MG tablet Take 4 mg by mouth every 4 (four) hours as needed for nausea or vomiting.   Yes Historical Provider, MD  QUEtiapine (SEROQUEL) 25 MG tablet Take 1 tablet (25 mg total) by mouth 2 (two) times daily. 12/06/16  Yes Vaughan Basta, MD  senna-docusate (SENOKOT-S) 8.6-50 MG tablet Take 1 tablet by mouth 2 (two) times daily as needed for mild constipation.   Yes Historical Provider, MD    Review of Systems  Constitutional: Positive for fatigue. Negative for appetite change.  HENT: Negative for congestion, postnasal drip and sore throat.   Eyes: Negative.   Respiratory: Positive for shortness of breath. Negative for chest tightness and wheezing.   Cardiovascular: Positive for leg swelling. Negative for chest pain and palpitations.  Gastrointestinal: Negative for abdominal distention and abdominal pain.  Endocrine: Negative.   Genitourinary: Negative.   Musculoskeletal: Positive for back pain. Negative for neck stiffness.  Skin: Negative.   Allergic/Immunologic: Negative.   Neurological: Positive for light-headedness. Negative for dizziness.  Hematological: Negative for adenopathy. Does not bruise/bleed easily.  Psychiatric/Behavioral: Negative for dysphoric mood, sleep disturbance (wearing oxygen at 2L around the clock) and suicidal ideas. The patient is not nervous/anxious.    Vitals:   03/17/17 1119  BP: 129/83  Pulse: 76  Resp: 18  SpO2: 97%  Weight: 124 lb (56.2 kg)  Height: 5\' 2"  (1.575 m)   Wt Readings from Last 3 Encounters:  03/17/17 124 lb (56.2 kg)  03/04/17 151 lb 8 oz (68.7 kg)  01/09/17 127 lb (57.6 kg)   Lab Results  Component Value Date   CREATININE 0.61  03/06/2017   CREATININE 0.35 (L) 03/05/2017   CREATININE 0.57 03/04/2017    Physical Exam  Constitutional: She is oriented to person, place, and time. She appears well-developed and well-nourished.  HENT:  Head: Normocephalic and atraumatic.  Eyes: Conjunctivae are normal. Pupils are equal, round, and reactive to light.  Neck: Normal range of motion. Neck supple. No JVD present.  Cardiovascular: Normal rate.  An irregular rhythm present.  Pulmonary/Chest: Effort normal. She has no wheezes. She has no rales.  Abdominal: Soft. She exhibits no distension. There is no tenderness.  Musculoskeletal: She exhibits edema (trace edema in bilateral lower legs). She exhibits no tenderness.  Neurological: She is alert and oriented to person, place, and time.  Skin: Skin is warm and dry.  Psychiatric: She has a normal mood and affect. Her behavior is normal. Thought  content normal.  Nursing note and vitals reviewed.    Assessment & Plan:  1: Chronic heart failure with preserved ejection fraction- - NYHA class III - euvolemic today - order written for daily weights with calling for an overnight weight gain of >2 pounds or a weekly weight gain of >5 pounds - not adding salt and is using Mrs. Dash instead - does have support socks on - wears oxygen at 2L around the clock - use to see Dr. Ubaldo Glassing but POA says that she now has to pay out of pocket to see him since [facility] has multiple doctors available  2: HTN- - BP looks good today - sees Dr. Lovie Macadamia for her primary care needs while at [facility]  3: Spinal stenosis- - isn't very active due to this - does have a walker and was receiving physical therapy but patient chose to stop doing the therapy  SNF medication list was reviewed.  Return here in 1 month or sooner for any questions/problems before then.

## 2017-03-21 ENCOUNTER — Other Ambulatory Visit: Payer: Self-pay | Admitting: Family Medicine

## 2017-03-21 DIAGNOSIS — R1319 Other dysphagia: Secondary | ICD-10-CM

## 2017-04-04 DIAGNOSIS — I5033 Acute on chronic diastolic (congestive) heart failure: Secondary | ICD-10-CM | POA: Diagnosis not present

## 2017-04-04 DIAGNOSIS — M6281 Muscle weakness (generalized): Secondary | ICD-10-CM | POA: Diagnosis not present

## 2017-04-04 DIAGNOSIS — I482 Chronic atrial fibrillation: Secondary | ICD-10-CM | POA: Diagnosis not present

## 2017-04-04 DIAGNOSIS — M549 Dorsalgia, unspecified: Secondary | ICD-10-CM | POA: Diagnosis not present

## 2017-04-04 DIAGNOSIS — J9620 Acute and chronic respiratory failure, unspecified whether with hypoxia or hypercapnia: Secondary | ICD-10-CM | POA: Diagnosis not present

## 2017-04-04 DIAGNOSIS — R42 Dizziness and giddiness: Secondary | ICD-10-CM | POA: Diagnosis not present

## 2017-04-04 DIAGNOSIS — I10 Essential (primary) hypertension: Secondary | ICD-10-CM | POA: Diagnosis not present

## 2017-04-04 DIAGNOSIS — Z9981 Dependence on supplemental oxygen: Secondary | ICD-10-CM | POA: Diagnosis not present

## 2017-04-13 ENCOUNTER — Ambulatory Visit
Admission: RE | Admit: 2017-04-13 | Discharge: 2017-04-13 | Disposition: A | Discharge status: Home / Self Care | Payer: Medicare Other | Source: Ambulatory Visit | Attending: Family Medicine | Admitting: Family Medicine

## 2017-04-13 DIAGNOSIS — R1310 Dysphagia, unspecified: Secondary | ICD-10-CM | POA: Diagnosis not present

## 2017-04-13 DIAGNOSIS — R1312 Dysphagia, oropharyngeal phase: Secondary | ICD-10-CM | POA: Insufficient documentation

## 2017-04-13 NOTE — Therapy (Signed)
[place], [place], [address] Phone: [phone]   Fax:     Modified Barium Swallow  Patient Details  Name: Heather Barker MRN: [id number] Date of Birth: Sep 22, 1930 No Data Recorded  Encounter Date: 04/13/2017      End of Session - 04/13/17 1339    Visit Number 1   Number of Visits 1   Date for SLP Re-Evaluation 04/13/17   SLP Start Time 1250   SLP Stop Time  1340   SLP Time Calculation (min) 50 min   Activity Tolerance Patient tolerated treatment well      Past Medical History:  Diagnosis Date  . Atrial fibrillation ([place])   . Basal cell cancer 1990's   forehead  . CHF (congestive heart failure) ([place])   . Depression   . Hypertension   . Osteoarthritis   . Psychosis   . Rosacea   . Stenosis, cervical spine   . Urinary incontinence     Past Surgical History:  Procedure Laterality Date  . APPENDECTOMY    . CATARACT EXTRACTION     left eye  . CATARACT EXTRACTION  07/2010   right, IOL  . DILATION AND CURETTAGE OF UTERUS  1970's  . KNEE ARTHROSCOPY  1996   right   . ROTATOR CUFF REPAIR     left torn    There were no vitals filed for this visit.   Subjective: Patient behavior: (alertness, ability to follow instructions, etc.): patient appears very anxious, able to follow directions.  Chief complaint: patient has had several hospital admissions for community acquired pneumonia.   Objective:  Radiological Procedure: A videoflouroscopic evaluation of oral-preparatory, reflex initiation, and pharyngeal phases of the swallow was performed; as well as a screening of the upper esophageal phase.  I. POSTURE: Upright in MBS chair  II. VIEW: Lateral  III. COMPENSATORY STRATEGIES: Liquid chaser- clears most of vallecular residue  IV. BOLUSES ADMINISTERED:   Thin Liquid: 1 cup rim, 3 rapid consecutive   Nectar-thick Liquid: 1 cup rim   Honey-thick Liquid: N/A   Puree: 1  teaspoon presentation   Mechanical Soft: 1/4 graham cracker in applesauce  V. RESULTS OF EVALUATION: A. ORAL PREPARATORY PHASE: (The lips, tongue, and velum are observed for strength and coordination)       **Overall Severity Rating: Minimal; slowed posterior transfer without overt neuromuscular involvement  B. SWALLOW INITIATION/REFLEX: (The reflex is normal if "triggered" by the time the bolus reached the base of the tongue)  **Overall Severity Rating: Mild; triggers while falling from the valleculae to the pyriform sinuses  C. PHARYNGEAL PHASE: (Pharyngeal function is normal if the bolus shows rapid, smooth, and continuous transit through the pharynx and there is no pharyngeal residue after the swallow)  **Overall Severity Rating: Mild; reduced tongue base retraction, hyolaryngeal excursion, epiglottic inversion; mild vallecular residue with solids and minimal with thin liquid  D. LARYNGEAL PENETRATION: (Material entering into the laryngeal inlet/vestibule but not aspirated) None  E. ASPIRATION: None  F. ESOPHAGEAL PHASE: (Screening of the upper esophagus) No significant abnormality within the viewable cervical esophagus  ASSESSMENT: This 83 year old woman, diagnosed with dementia with psychosis, is presenting with mild oropharyngeal dysphagia.  Oral control of the bolus including oral hold, rotary mastication, and anterior to posterior transfer is prolonged but within functional limits. Timing of the pharyngeal swallow is delayed, triggering while falling from the valleculae to the pyriform sinuses.  Aspects of the pharyngeal stage of swallowing  including tongue base retraction, hyolaryngeal excursion, and epiglottic inversion are mildly reduced.  There is mild vallecular residue, significantly less with liquid.  There was no observed laryngeal penetration or tracheal aspiration.  The patient is not at significant risk for prandial aspiration.    PLAN/RECOMMENDATIONS:   A. Diet:  Regular   B. Swallowing Precautions: Standard   C. Recommended consultation to N/A   D. Therapy recommendations   E. Results and recommendations were discussed with the patient immediately following the study and the final report routed to the referring practitioner.     Patient will benefit from skilled therapeutic intervention in order to improve the following deficits and impairments:   Oropharyngeal dysphagia - Plan: DG OP Swallowing Func-Medicare/Speech Path, DG OP Swallowing Func-Medicare/Speech Path      G-Codes - 04/24/17 1340    Functional Assessment Tool Used MBS, clinical judgment   Functional Limitations Swallowing   Swallow Current Status (O0321) At least 20 percent but less than 40 percent impaired, limited or restricted   Swallow Goal Status (Y2482) At least 20 percent but less than 40 percent impaired, limited or restricted   Swallow Discharge Status [phone]) At least 20 percent but less than 40 percent impaired, limited or restricted          Problem List Patient Active Problem List   Diagnosis Date Noted  . Chronic diastolic heart failure ([place]) 03/17/2017  . Pressure injury of skin 03/05/2017  . Aspiration pneumonia ([place]) 12/04/2016  . Pneumonia 12/04/2016  . Dementia with psychosis 01/02/2016  . Hypertension 01/02/2016  . OSTEOARTHRITIS 07/04/2008  . LEG PAIN 09/28/2007  . DEPRESSION 07/20/2007  . ROSACEA 07/20/2007  . SPINAL STENOSIS, CERVICAL 07/20/2007  . URINARY INCONTINENCE 07/20/2007   Leroy Sea, MS/CCC- SLP  Lou Miner 24-Apr-2017, 1:41 PM  [facility] [place], [place], [address] Phone: [phone]   Fax:     Name: Heather Barker MRN: [id number] Date of Birth: 1930/11/02

## 2017-04-14 ENCOUNTER — Encounter: Payer: Self-pay | Admitting: Family

## 2017-04-14 ENCOUNTER — Ambulatory Visit: Payer: Medicare Other | Attending: Family | Admitting: Family

## 2017-04-14 VITALS — BP 122/67 | HR 58 | Resp 20 | Ht 62.0 in | Wt 129.4 lb

## 2017-04-14 DIAGNOSIS — L719 Rosacea, unspecified: Secondary | ICD-10-CM | POA: Insufficient documentation

## 2017-04-14 DIAGNOSIS — Z7982 Long term (current) use of aspirin: Secondary | ICD-10-CM | POA: Diagnosis not present

## 2017-04-14 DIAGNOSIS — I110 Hypertensive heart disease with heart failure: Secondary | ICD-10-CM | POA: Diagnosis not present

## 2017-04-14 DIAGNOSIS — I5032 Chronic diastolic (congestive) heart failure: Secondary | ICD-10-CM | POA: Insufficient documentation

## 2017-04-14 DIAGNOSIS — J9620 Acute and chronic respiratory failure, unspecified whether with hypoxia or hypercapnia: Secondary | ICD-10-CM | POA: Diagnosis not present

## 2017-04-14 DIAGNOSIS — J189 Pneumonia, unspecified organism: Secondary | ICD-10-CM | POA: Diagnosis not present

## 2017-04-14 DIAGNOSIS — M1990 Unspecified osteoarthritis, unspecified site: Secondary | ICD-10-CM | POA: Diagnosis not present

## 2017-04-14 DIAGNOSIS — F329 Major depressive disorder, single episode, unspecified: Secondary | ICD-10-CM | POA: Diagnosis not present

## 2017-04-14 DIAGNOSIS — M4802 Spinal stenosis, cervical region: Secondary | ICD-10-CM | POA: Diagnosis not present

## 2017-04-14 DIAGNOSIS — A419 Sepsis, unspecified organism: Secondary | ICD-10-CM | POA: Diagnosis not present

## 2017-04-14 DIAGNOSIS — I4891 Unspecified atrial fibrillation: Secondary | ICD-10-CM | POA: Diagnosis not present

## 2017-04-14 DIAGNOSIS — Z85828 Personal history of other malignant neoplasm of skin: Secondary | ICD-10-CM | POA: Diagnosis not present

## 2017-04-14 DIAGNOSIS — Z880 Allergy status to penicillin: Secondary | ICD-10-CM | POA: Insufficient documentation

## 2017-04-14 DIAGNOSIS — I10 Essential (primary) hypertension: Secondary | ICD-10-CM

## 2017-04-14 NOTE — Progress Notes (Signed)
Patient ID: Heather Barker, female    DOB: 1930/10/01, 83 y.o.   MRN: [id number]  HPI  Ms Cimini is a 83 y/o female with a history of cervical stenosis, osteoarthritis, HTN, depression, atrial fibrillation and chronic heart failure.   Reviewed last echo that was done 12/05/16 and it showed an EF of 60-65% along with trivial AR, moderate MR and mod/severe TR.   Admitted 03/04/17 with sepsis due to pneumonia. Begun on levaquin. Initially needed bipap but then transitioned to nasal cannula. Discharged to SNF. Admitted 01/09/17 with acute on chronic respiratory failure secondary to pneumonia. Continued supplemental oxygen and IV antibiotics. Tested negative for influenza. Tested positive for MRSA. Had speech evaluation. Discharged to SNF. Admitted 12/04/16 with pneumonia and acute on chronic HF. Given broad spectrum antibiotics and IV diuretics. Discharged to SNF.   She presents today for her follow-up visit with a chief complaint of moderate shortness of breath with minimal exertion. She says that this is chronic in nature and has been present for years. She notes improvement with wearing her oxygen at 2L or when she stops to rest for short periods of time. She has associated fatigue and light-headedness along with this.   Past Medical History:  Diagnosis Date  . Atrial fibrillation ([place])   . Basal cell cancer 1990's   forehead  . CHF (congestive heart failure) ([place])   . Depression   . Hypertension   . Osteoarthritis   . Psychosis   . Rosacea   . Stenosis, cervical spine   . Urinary incontinence    Past Surgical History:  Procedure Laterality Date  . APPENDECTOMY    . CATARACT EXTRACTION     left eye  . CATARACT EXTRACTION  07/2010   right, IOL  . DILATION AND CURETTAGE OF UTERUS  1970's  . KNEE ARTHROSCOPY  1996   right   . ROTATOR CUFF REPAIR     left torn   Family History  Problem Relation Age of Onset  . Cancer Mother     lung  . Hypertension Mother   . Heart disease  Father   . Hypertension Father   . Diabetes Neg Hx    Social History  Substance Use Topics  . Smoking status: Never Smoker  . Smokeless tobacco: Never Used  . Alcohol use Yes     Comment: occasional   Allergies  Allergen Reactions  . Codeine Phosphate     REACTION: intolerance--hydrocodone okay  . Meperidine Hcl     REACTION: unspecified  . Penicillins    Prior to Admission medications   Medication Sig Start Date End Date Taking? Authorizing Provider  acetaminophen (TYLENOL) 500 MG tablet Take 500 mg by mouth 2 (two) times daily.    Yes Historical Provider, MD  aspirin EC 81 MG tablet Take 81 mg by mouth daily.   Yes Historical Provider, MD  Biotin 10 MG TABS Take 10 mg by mouth daily.   Yes Historical Provider, MD  digoxin (LANOXIN) 0.125 MG tablet Take 0.125 mg by mouth daily.   Yes Historical Provider, MD  diltiazem (CARDIZEM CD) 240 MG 24 hr capsule Take 1 capsule (240 mg total) by mouth daily. 12/07/16  Yes Vaughan Basta, MD  furosemide (LASIX) 20 MG tablet Take 1 tablet (20 mg total) by mouth daily. 03/06/17  Yes Sital Mody, MD  ipratropium-albuterol (DUONEB) 0.5-2.5 (3) MG/3ML SOLN Take 3 mLs by nebulization every 6 (six) hours as needed. 01/11/17  Yes Nicholes Mango, MD  lidocaine ([place])  5 % Place 1 patch onto the skin every 12 (twelve) hours. Remove & Discard patch within 12 hours or as directed by MD   Yes Historical Provider, MD  meclizine (ANTIVERT) 12.5 MG tablet Take 12.5 mg by mouth 3 (three) times daily. At 0800, 1400, and 2000   Yes Historical Provider, MD  Menthol, Topical Analgesic, (BIOFREEZE) 4 % GEL Apply 1 application topically 2 (two) times daily.   Yes Historical Provider, MD  metoprolol tartrate (LOPRESSOR) 25 MG tablet Take 0.5 tablets (12.5 mg total) by mouth 2 (two) times daily. 12/06/16  Yes Vaughan Basta, MD  metroNIDAZOLE (METROGEL) 1 % gel Apply 1 application topically 2 (two) times daily as needed.    Yes Historical Provider, MD   Miconazole Nitrate 2 % AERP Apply topically. Apply topically to under breast and groin once a day 1500-2300   Yes Historical Provider, MD  Multiple Vitamin (MULTIVITAMIN) tablet Take 1 tablet by mouth daily.   Yes Historical Provider, MD  ondansetron (ZOFRAN) 4 MG tablet Take 4 mg by mouth every 4 (four) hours as needed for nausea or vomiting.   Yes Historical Provider, MD  QUEtiapine (SEROQUEL) 25 MG tablet Take 1 tablet (25 mg total) by mouth 2 (two) times daily. 12/06/16  Yes Vaughan Basta, MD  senna-docusate (SENOKOT-S) 8.6-50 MG tablet Take 1 tablet by mouth 2 (two) times daily as needed for mild constipation.   Yes Historical Provider, MD    Review of Systems  Constitutional: Positive for fatigue. Negative for appetite change.  HENT: Negative for congestion, postnasal drip and sore throat.   Eyes: Negative.   Respiratory: Positive for shortness of breath (wearing oxygen at 2L). Negative for chest tightness and wheezing.   Cardiovascular: Negative for chest pain, palpitations and leg swelling (wearing TED hose).  Gastrointestinal: Negative for abdominal distention and abdominal pain.  Endocrine: Negative.   Genitourinary: Negative.   Musculoskeletal: Positive for back pain. Negative for neck stiffness.  Skin: Negative.   Allergic/Immunologic: Negative.   Neurological: Positive for light-headedness. Negative for dizziness.  Hematological: Negative for adenopathy. Does not bruise/bleed easily.  Psychiatric/Behavioral: Negative for dysphoric mood, sleep disturbance (wearing oxygen at 2L around the clock) and suicidal ideas. The patient is not nervous/anxious.    Vitals:   04/14/17 1015  BP: 122/67  Pulse: (!) 58  Resp: 20  SpO2: 96%  Weight: 129 lb 6 oz (58.7 kg)  Height: 5\' 2"  (1.575 m)   Wt Readings from Last 3 Encounters:  04/14/17 129 lb 6 oz (58.7 kg)  03/17/17 124 lb (56.2 kg)  03/04/17 151 lb 8 oz (68.7 kg)   Lab Results  Component Value Date   CREATININE  0.61 03/06/2017   CREATININE 0.35 (L) 03/05/2017   CREATININE 0.57 03/04/2017    Physical Exam  Constitutional: She is oriented to person, place, and time. She appears well-developed and well-nourished.  HENT:  Head: Normocephalic and atraumatic.  Eyes: Conjunctivae are normal. Pupils are equal, round, and reactive to light.  Neck: Normal range of motion. Neck supple. No JVD present.  Cardiovascular: An irregular rhythm present. Bradycardia present.   Pulmonary/Chest: Effort normal. She has no wheezes. She has no rales.  Abdominal: Soft. She exhibits no distension. There is no tenderness.  Musculoskeletal: She exhibits no edema or tenderness.  Neurological: She is alert and oriented to person, place, and time.  Skin: Skin is warm and dry.  Psychiatric: She has a normal mood and affect. Her behavior is normal. Thought content normal.  Nursing  note and vitals reviewed.    Assessment & Plan:  1: Chronic heart failure with preserved ejection fraction- - NYHA class III - euvolemic today - being weighed daily and reminded her to have the staff call for an overnight weight gain of >2 pounds or a weekly weight gain of >5 pounds - not adding salt and is using Mrs. Dash instead. POA did bring patient chinese food the other day and discussed that chinese food was high in sodium. - does have support socks on - has not taken her diuretic yet today but nurse says she will get it upon her return  - wears oxygen at 2L around the clock - use to see Dr. Ubaldo Glassing but POA says that she now has to pay out of pocket to see him since [facility] has multiple doctors available  2: HTN- - BP looks good today - sees Dr. Lovie Macadamia for her primary care needs while at [facility]  SNF medication list was reviewed.  Return here in 3 months or sooner for any questions/problems before then.

## 2017-04-14 NOTE — Patient Instructions (Signed)
Continue weighing daily and call for an overnight weight gain of > 2 pounds or a weekly weight gain of >5 pounds. 

## 2017-04-27 DIAGNOSIS — N390 Urinary tract infection, site not specified: Secondary | ICD-10-CM | POA: Diagnosis not present

## 2017-05-10 ENCOUNTER — Inpatient Hospital Stay
Admission: EM | Admit: 2017-05-10 | Discharge: 2017-05-15 | DRG: 291 | Disposition: A | Discharge status: Skilled Nursing Facility | Payer: Medicare Other | Attending: Internal Medicine | Admitting: Internal Medicine

## 2017-05-10 ENCOUNTER — Emergency Department: Payer: Medicare Other

## 2017-05-10 DIAGNOSIS — Z801 Family history of malignant neoplasm of trachea, bronchus and lung: Secondary | ICD-10-CM

## 2017-05-10 DIAGNOSIS — Z7982 Long term (current) use of aspirin: Secondary | ICD-10-CM | POA: Diagnosis not present

## 2017-05-10 DIAGNOSIS — F0390 Unspecified dementia without behavioral disturbance: Secondary | ICD-10-CM | POA: Diagnosis present

## 2017-05-10 DIAGNOSIS — J9601 Acute respiratory failure with hypoxia: Secondary | ICD-10-CM | POA: Diagnosis present

## 2017-05-10 DIAGNOSIS — I5033 Acute on chronic diastolic (congestive) heart failure: Secondary | ICD-10-CM | POA: Diagnosis present

## 2017-05-10 DIAGNOSIS — I509 Heart failure, unspecified: Secondary | ICD-10-CM | POA: Diagnosis not present

## 2017-05-10 DIAGNOSIS — Z885 Allergy status to narcotic agent status: Secondary | ICD-10-CM

## 2017-05-10 DIAGNOSIS — Z833 Family history of diabetes mellitus: Secondary | ICD-10-CM | POA: Diagnosis not present

## 2017-05-10 DIAGNOSIS — I482 Chronic atrial fibrillation: Secondary | ICD-10-CM | POA: Diagnosis present

## 2017-05-10 DIAGNOSIS — Z9841 Cataract extraction status, right eye: Secondary | ICD-10-CM

## 2017-05-10 DIAGNOSIS — F329 Major depressive disorder, single episode, unspecified: Secondary | ICD-10-CM | POA: Diagnosis present

## 2017-05-10 DIAGNOSIS — Z85828 Personal history of other malignant neoplasm of skin: Secondary | ICD-10-CM | POA: Diagnosis not present

## 2017-05-10 DIAGNOSIS — I110 Hypertensive heart disease with heart failure: Principal | ICD-10-CM | POA: Diagnosis present

## 2017-05-10 DIAGNOSIS — Z9842 Cataract extraction status, left eye: Secondary | ICD-10-CM | POA: Diagnosis not present

## 2017-05-10 DIAGNOSIS — Z888 Allergy status to other drugs, medicaments and biological substances status: Secondary | ICD-10-CM | POA: Diagnosis not present

## 2017-05-10 DIAGNOSIS — M1990 Unspecified osteoarthritis, unspecified site: Secondary | ICD-10-CM | POA: Diagnosis present

## 2017-05-10 DIAGNOSIS — Z66 Do not resuscitate: Secondary | ICD-10-CM | POA: Diagnosis present

## 2017-05-10 DIAGNOSIS — J209 Acute bronchitis, unspecified: Secondary | ICD-10-CM | POA: Diagnosis present

## 2017-05-10 DIAGNOSIS — M545 Low back pain: Secondary | ICD-10-CM | POA: Diagnosis present

## 2017-05-10 DIAGNOSIS — N342 Other urethritis: Secondary | ICD-10-CM

## 2017-05-10 DIAGNOSIS — E872 Acidosis: Secondary | ICD-10-CM | POA: Diagnosis present

## 2017-05-10 DIAGNOSIS — Z961 Presence of intraocular lens: Secondary | ICD-10-CM | POA: Diagnosis present

## 2017-05-10 DIAGNOSIS — Z880 Allergy status to penicillin: Secondary | ICD-10-CM | POA: Diagnosis not present

## 2017-05-10 DIAGNOSIS — I501 Left ventricular failure: Secondary | ICD-10-CM | POA: Diagnosis not present

## 2017-05-10 DIAGNOSIS — Z7401 Bed confinement status: Secondary | ICD-10-CM | POA: Diagnosis not present

## 2017-05-10 DIAGNOSIS — R0603 Acute respiratory distress: Secondary | ICD-10-CM | POA: Diagnosis present

## 2017-05-10 DIAGNOSIS — I272 Pulmonary hypertension, unspecified: Secondary | ICD-10-CM | POA: Diagnosis present

## 2017-05-10 DIAGNOSIS — J810 Acute pulmonary edema: Secondary | ICD-10-CM | POA: Diagnosis not present

## 2017-05-10 DIAGNOSIS — R0600 Dyspnea, unspecified: Secondary | ICD-10-CM | POA: Diagnosis not present

## 2017-05-10 DIAGNOSIS — Z8249 Family history of ischemic heart disease and other diseases of the circulatory system: Secondary | ICD-10-CM | POA: Diagnosis not present

## 2017-05-10 DIAGNOSIS — G8929 Other chronic pain: Secondary | ICD-10-CM | POA: Diagnosis present

## 2017-05-10 DIAGNOSIS — J9602 Acute respiratory failure with hypercapnia: Secondary | ICD-10-CM | POA: Diagnosis present

## 2017-05-10 LAB — URINALYSIS, COMPLETE (UACMP) WITH MICROSCOPIC
Bacteria, UA: NONE SEEN
Bilirubin Urine: NEGATIVE
GLUCOSE, UA: NEGATIVE mg/dL
HGB URINE DIPSTICK: NEGATIVE
KETONES UR: NEGATIVE mg/dL
NITRITE: NEGATIVE
PH: 6 (ref 5.0–8.0)
Protein, ur: 30 mg/dL — AB
Specific Gravity, Urine: 1.01 (ref 1.005–1.030)

## 2017-05-10 LAB — COMPREHENSIVE METABOLIC PANEL
ALK PHOS: 99 U/L (ref 38–126)
ALT: 11 U/L — AB (ref 14–54)
AST: 27 U/L (ref 15–41)
Albumin: 3.8 g/dL (ref 3.5–5.0)
Anion gap: 11 (ref 5–15)
BILIRUBIN TOTAL: 1.5 mg/dL — AB (ref 0.3–1.2)
BUN: 18 mg/dL (ref 6–20)
CALCIUM: 8.8 mg/dL — AB (ref 8.9–10.3)
CHLORIDE: 96 mmol/L — AB (ref 101–111)
CO2: 26 mmol/L (ref 22–32)
CREATININE: 0.78 mg/dL (ref 0.44–1.00)
Glucose, Bld: 224 mg/dL — ABNORMAL HIGH (ref 65–99)
Potassium: 4.4 mmol/L (ref 3.5–5.1)
Sodium: 133 mmol/L — ABNORMAL LOW (ref 135–145)
TOTAL PROTEIN: 7.1 g/dL (ref 6.5–8.1)

## 2017-05-10 LAB — BLOOD GAS, ARTERIAL
Acid-Base Excess: 3.2 mmol/L — ABNORMAL HIGH (ref 0.0–2.0)
Bicarbonate: 30.4 mmol/L — ABNORMAL HIGH (ref 20.0–28.0)
Delivery systems: POSITIVE
Expiratory PAP: 8
FIO2: 0.7
INSPIRATORY PAP: 18
MECHANICAL RATE: 8
O2 Saturation: 95.4 %
PATIENT TEMPERATURE: 37
PCO2 ART: 59 mmHg — AB (ref 32.0–48.0)
RATE: 28 resp/min
pH, Arterial: 7.32 — ABNORMAL LOW (ref 7.350–7.450)
pO2, Arterial: 84 mmHg (ref 83.0–108.0)

## 2017-05-10 LAB — CBC WITH DIFFERENTIAL/PLATELET
BASOS ABS: 0.1 10*3/uL (ref 0–0.1)
Basophils Relative: 1 %
Eosinophils Absolute: 0.3 10*3/uL (ref 0–0.7)
Eosinophils Relative: 3 %
HEMATOCRIT: 35.1 % (ref 35.0–47.0)
HEMOGLOBIN: 11.2 g/dL — AB (ref 12.0–16.0)
LYMPHS ABS: 2.1 10*3/uL (ref 1.0–3.6)
LYMPHS PCT: 15 %
MCH: 27.2 pg (ref 26.0–34.0)
MCHC: 32 g/dL (ref 32.0–36.0)
MCV: 85.3 fL (ref 80.0–100.0)
Monocytes Absolute: 0.6 10*3/uL (ref 0.2–0.9)
Monocytes Relative: 4 %
NEUTROS ABS: 10.2 10*3/uL — AB (ref 1.4–6.5)
Neutrophils Relative %: 77 %
Platelets: 266 10*3/uL (ref 150–440)
RBC: 4.11 MIL/uL (ref 3.80–5.20)
RDW: 17.9 % — ABNORMAL HIGH (ref 11.5–14.5)
WBC: 13.3 10*3/uL — AB (ref 3.6–11.0)

## 2017-05-10 LAB — PROCALCITONIN: Procalcitonin: <0.1 ng/mL

## 2017-05-10 LAB — TROPONIN I: Troponin I: <0.03 ng/mL (ref <0.03)

## 2017-05-10 LAB — BRAIN NATRIURETIC PEPTIDE: B NATRIURETIC PEPTIDE 5: 306 pg/mL — AB (ref 0.0–100.0)

## 2017-05-10 LAB — GLUCOSE, CAPILLARY: Glucose-Capillary: 76 mg/dL (ref 65–99)

## 2017-05-10 LAB — LACTIC ACID, PLASMA: LACTIC ACID, VENOUS: 3.7 mmol/L — AB (ref 0.5–1.9)

## 2017-05-10 LAB — MRSA PCR SCREENING: MRSA by PCR: NEGATIVE

## 2017-05-10 MED ORDER — NITROGLYCERIN IN D5W 200-5 MCG/ML-% IV SOLN
INTRAVENOUS | Status: AC
  Administered 2017-05-10: 5 ug/min via INTRAVENOUS
  Filled 2017-05-10: qty 250

## 2017-05-10 MED ORDER — ACETAMINOPHEN 650 MG RE SUPP: 650.0000 mg | Freq: Four times a day (QID) | RECTAL | Status: DC | PRN

## 2017-05-10 MED ORDER — SENNOSIDES-DOCUSATE SODIUM 8.6-50 MG PO TABS: 1.0000 | ORAL_TABLET | Freq: Two times a day (BID) | ORAL | Status: DC | PRN

## 2017-05-10 MED ORDER — ALUM & MAG HYDROXIDE-SIMETH 200-200-20 MG/5ML PO SUSP
30.0000 mL | Freq: Three times a day (TID) | ORAL | Status: DC | PRN
  Administered 2017-05-11 – 2017-05-15 (×6): 30 mL via ORAL
  Filled 2017-05-10 (×6): qty 30

## 2017-05-10 MED ORDER — IPRATROPIUM-ALBUTEROL 0.5-2.5 (3) MG/3ML IN SOLN: 3.0000 mL | Freq: Four times a day (QID) | RESPIRATORY_TRACT | Status: DC | PRN

## 2017-05-10 MED ORDER — DIGOXIN 125 MCG PO TABS
0.1250 mg | ORAL_TABLET | Freq: Every day | ORAL | Status: DC
  Administered 2017-05-11 – 2017-05-13 (×3): 0.125 mg via ORAL
  Filled 2017-05-10 (×3): qty 1

## 2017-05-10 MED ORDER — BIOTIN 10 MG PO TABS: 10.0000 mg | ORAL_TABLET | Freq: Every day | ORAL | Status: DC

## 2017-05-10 MED ORDER — ENOXAPARIN SODIUM 40 MG/0.4ML ~~LOC~~ SOLN
40.0000 mg | SUBCUTANEOUS | Status: DC
  Administered 2017-05-10 – 2017-05-14 (×5): 40 mg via SUBCUTANEOUS
  Filled 2017-05-10 (×4): qty 0.4

## 2017-05-10 MED ORDER — FUROSEMIDE 10 MG/ML IJ SOLN
40.0000 mg | Freq: Once | INTRAMUSCULAR | Status: AC
  Administered 2017-05-10: 40 mg via INTRAVENOUS

## 2017-05-10 MED ORDER — ASPIRIN EC 81 MG PO TBEC
81.0000 mg | DELAYED_RELEASE_TABLET | Freq: Every day | ORAL | Status: DC
  Administered 2017-05-11 – 2017-05-15 (×5): 81 mg via ORAL
  Filled 2017-05-10 (×5): qty 1

## 2017-05-10 MED ORDER — ONDANSETRON HCL 4 MG PO TABS: 4.0000 mg | ORAL_TABLET | Freq: Four times a day (QID) | ORAL | Status: DC | PRN

## 2017-05-10 MED ORDER — DEXTROSE 5 % IV SOLN
1.0000 g | Freq: Once | INTRAVENOUS | Status: AC
  Administered 2017-05-10: 1 g via INTRAVENOUS
  Filled 2017-05-10 (×3): qty 10

## 2017-05-10 MED ORDER — NITROGLYCERIN IN D5W 200-5 MCG/ML-% IV SOLN
0.0000 ug/min | Freq: Once | INTRAVENOUS | Status: AC
  Administered 2017-05-10: 5 ug/min via INTRAVENOUS

## 2017-05-10 MED ORDER — DILTIAZEM HCL ER COATED BEADS 240 MG PO CP24
240.0000 mg | ORAL_CAPSULE | Freq: Every day | ORAL | Status: DC
  Administered 2017-05-11: 240 mg via ORAL
  Filled 2017-05-10: qty 1

## 2017-05-10 MED ORDER — QUETIAPINE FUMARATE 25 MG PO TABS
25.0000 mg | ORAL_TABLET | Freq: Two times a day (BID) | ORAL | Status: DC
  Administered 2017-05-11 – 2017-05-15 (×9): 25 mg via ORAL
  Filled 2017-05-10 (×9): qty 1

## 2017-05-10 MED ORDER — FUROSEMIDE 10 MG/ML IJ SOLN
40.0000 mg | Freq: Two times a day (BID) | INTRAMUSCULAR | Status: DC
  Administered 2017-05-10 – 2017-05-13 (×6): 40 mg via INTRAVENOUS
  Filled 2017-05-10 (×6): qty 4

## 2017-05-10 MED ORDER — FUROSEMIDE 10 MG/ML IJ SOLN
INTRAMUSCULAR | Status: AC
  Administered 2017-05-10: 40 mg via INTRAVENOUS
  Filled 2017-05-10: qty 4

## 2017-05-10 MED ORDER — ONDANSETRON HCL 4 MG/2ML IJ SOLN: 4.0000 mg | Freq: Four times a day (QID) | INTRAMUSCULAR | Status: DC | PRN

## 2017-05-10 MED ORDER — METOPROLOL TARTRATE 25 MG PO TABS
12.5000 mg | ORAL_TABLET | Freq: Two times a day (BID) | ORAL | Status: DC
  Administered 2017-05-11: 12.5 mg via ORAL
  Filled 2017-05-10 (×2): qty 1

## 2017-05-10 MED ORDER — ACETAMINOPHEN 325 MG PO TABS
650.0000 mg | ORAL_TABLET | Freq: Four times a day (QID) | ORAL | Status: DC | PRN
  Administered 2017-05-11 – 2017-05-13 (×4): 650 mg via ORAL
  Filled 2017-05-10 (×4): qty 2

## 2017-05-10 NOTE — ED Notes (Signed)
Secretary called [facility]U to inquire about bed status for this pt; per [facility], delay is due to a rapid response, states it may be a while until bed is approved.  Pt updated.

## 2017-05-10 NOTE — ED Notes (Signed)
Pharmacy notified to send Rocephin dose. 

## 2017-05-10 NOTE — H&P (Signed)
[facility] [place] NAME: Heather Barker    MR#:  [id number]  DATE OF BIRTH:  17-Jan-1930  DATE OF ADMISSION:  05/10/2017  PRIMARY CARE PHYSICIAN: Juluis Pitch, MD   REQUESTING/REFERRING PHYSICIAN: Dr. Shirlyn Goltz  CHIEF COMPLAINT:   Chief Complaint  Patient presents with  . Respiratory Distress    HISTORY OF PRESENT ILLNESS:  Heather Barker  is a 83 y.o. female with a known history of Atrial fibrillation, chronic diastolic CHF, hypertension, depression, urinary incontinence, skin cancer who presented to the hospital due to shortness of breath and respiratory distress. Patient is currently on BiPAP and therefore her history is limited. Patient presents to the hospital from a skilled nursing facility due to shortness of breath and hypoxia noted to have O2 sats in the 70s when EMS arrived. Patient presented to the emergency room was noted to be hypoxic and placed on BiPAP, her chest x-ray findings are suggestive of an edema and CHF, she was given a dose of IV Lasix and hospitalist services were contacted further treatment and evaluation.  PAST MEDICAL HISTORY:   Past Medical History:  Diagnosis Date  . Atrial fibrillation ([place])   . Basal cell cancer 1990's   forehead  . CHF (congestive heart failure) ([place])   . Depression   . Hypertension   . Osteoarthritis   . Psychosis   . Rosacea   . Stenosis, cervical spine   . Urinary incontinence     PAST SURGICAL HISTORY:   Past Surgical History:  Procedure Laterality Date  . APPENDECTOMY    . CATARACT EXTRACTION     left eye  . CATARACT EXTRACTION  07/2010   right, IOL  . DILATION AND CURETTAGE OF UTERUS  1970's  . KNEE ARTHROSCOPY  1996   right   . ROTATOR CUFF REPAIR     left torn    SOCIAL HISTORY:   Social History  Substance Use Topics  . Smoking status: Never Smoker  . Smokeless tobacco: Never Used  . Alcohol use Yes     Comment: occasional    FAMILY HISTORY:    Family History  Problem Relation Age of Onset  . Cancer Mother        lung  . Hypertension Mother   . Heart disease Father   . Hypertension Father   . Diabetes Neg Hx     DRUG ALLERGIES:   Allergies  Allergen Reactions  . Codeine Phosphate     REACTION: intolerance--hydrocodone okay  . Meperidine Hcl     REACTION: unspecified  . Penicillins     REVIEW OF SYSTEMS:   Review of Systems  Unable to perform ROS: Critical illness    MEDICATIONS AT HOME:   Prior to Admission medications   Medication Sig Start Date End Date Taking? Authorizing Provider  acetaminophen (TYLENOL) 500 MG tablet Take 500 mg by mouth 2 (two) times daily.    Yes [provider]  alum & mag hydroxide-simeth (MAALOX/MYLANTA) 200-200-20 MG/5ML suspension Take 30 mLs by mouth every 8 (eight) hours as needed for indigestion or heartburn.   Yes [provider]  aspirin EC 81 MG tablet Take 81 mg by mouth daily.   Yes [provider]  Biotin 10 MG TABS Take 10 mg by mouth daily.   Yes [provider]  digoxin (LANOXIN) 0.125 MG tablet Take 0.125 mg by mouth daily.   Yes [provider]  diltiazem (CARDIZEM CD) 240 MG 24 hr  capsule Take 1 capsule (240 mg total) by mouth daily. 12/07/16  Yes Vaughan Basta, MD  furosemide (LASIX) 20 MG tablet Take 1 tablet (20 mg total) by mouth daily. 03/06/17  Yes Mody, Sital, MD  ipratropium-albuterol (DUONEB) 0.5-2.5 (3) MG/3ML SOLN Take 3 mLs by nebulization every 6 (six) hours as needed. 01/11/17  Yes Gouru, Aruna, MD  lidocaine (LIDODERM) 5 % Place 1 patch onto the skin every 12 (twelve) hours. Remove & Discard patch within 12 hours or as directed by MD   Yes [provider]  meclizine (ANTIVERT) 12.5 MG tablet Take 12.5 mg by mouth 3 (three) times daily. At 0800, 1400, and 2000   Yes [provider]  Menthol, Topical Analgesic, (BIOFREEZE) 4 % GEL Apply 1 application topically 2 (two) times daily.   Yes  [provider]  metoprolol tartrate (LOPRESSOR) 25 MG tablet Take 0.5 tablets (12.5 mg total) by mouth 2 (two) times daily. 12/06/16  Yes Vaughan Basta, MD  metroNIDAZOLE (METROGEL) 1 % gel Apply 1 application topically 2 (two) times daily as needed.    Yes [provider]  Multiple Vitamin (MULTIVITAMIN) tablet Take 1 tablet by mouth daily.   Yes [provider]  ondansetron (ZOFRAN) 4 MG tablet Take 4 mg by mouth every 4 (four) hours as needed for nausea or vomiting.   Yes [provider]  QUEtiapine (SEROQUEL) 25 MG tablet Take 1 tablet (25 mg total) by mouth 2 (two) times daily. 12/06/16  Yes Vaughan Basta, MD  senna-docusate (SENOKOT-S) 8.6-50 MG tablet Take 1 tablet by mouth 2 (two) times daily as needed for mild constipation.   Yes [provider]      VITAL SIGNS:  Blood pressure (!) 99/59, pulse 88, temperature 98.4 F (36.9 C), resp. rate 19, weight 62.5 kg (137 lb 11.2 oz), SpO2 98 %.  PHYSICAL EXAMINATION:  Physical Exam  GENERAL:  83 y.o.-year-old patient lying in the bed lethargic on Bipap and in REsp. Distress.   EYES: Pupils equal, round, reactive to light. No scleral icterus. Extraocular muscles intact.  HEENT: Head atraumatic, normocephalic. Oropharynx and nasopharynx clear. No oropharyngeal erythema, dry oral mucosa  NECK:  Supple, no jugular venous distention. No thyroid enlargement, no tenderness.  LUNGS: Poor Resp. Effort, no wheezing, bibasilar rales, No rhonchi. + use of accessory muscles of respiration.  CARDIOVASCULAR: S1, S2 RRR. No murmurs, rubs, gallops, clicks.  ABDOMEN: Soft, nontender, nondistended. Bowel sounds present. No organomegaly or mass.  EXTREMITIES: No pedal edema, cyanosis, or clubbing. + 2 pedal & radial pulses b/l.   NEUROLOGIC: Cranial nerves II through XII are intact. No focal Motor or sensory deficits appreciated b/l. Globally weak PSYCHIATRIC: The patient is alert and oriented x  1. SKIN: No obvious rash, lesion, or ulcer.   LABORATORY PANEL:   CBC  Recent Labs Lab 05/10/17 1016  WBC 13.3*  HGB 11.2*  HCT 35.1  PLT 266   ------------------------------------------------------------------------------------------------------------------  Chemistries   Recent Labs Lab 05/10/17 1016  NA 133*  K 4.4  CL 96*  CO2 26  GLUCOSE 224*  BUN 18  CREATININE 0.78  CALCIUM 8.8*  AST 27  ALT 11*  ALKPHOS 99  BILITOT 1.5*   ------------------------------------------------------------------------------------------------------------------  Cardiac Enzymes  Recent Labs Lab 05/10/17 1016  TROPONINI <0.03   ------------------------------------------------------------------------------------------------------------------  RADIOLOGY:  Dg Chest Port 1 View  Result Date: 05/10/2017 CLINICAL DATA:  Shortness of breath EXAM: PORTABLE CHEST 1 VIEW COMPARISON:  03/05/2017 FINDINGS: Low volume chest with perihilar hazy opacities  that are greater than prior. There is cardiomegaly and small pleural effusions. IMPRESSION: 1. Symmetric airspace disease favoring CHF. Small pleural effusions. 2. Low volume chest. Electronically Signed   By: Monte Fantasia M.D.   On: 05/10/2017 10:32     IMPRESSION AND PLAN:   83 year old female with past medical history of atrial fibrillation, hypertension, chronic diastolic CHF, depression, urinary incontinence who presents to the hospital due to shortness of breath.  1. Acute respiratory failure with hypoxia and hypercapnia-secondary to decompensated CHF. -Continue BiPAP support, continue diuresis with Lasix for CHF. Wean off BiPAP as tolerated. -Admit to stepdown, we'll get pulmonary consult  2. CHF-acute on chronic diastolic CHF. -We'll diurese the patient with IV Lasix, follow I's and O's and daily weights. -Continue Cardizem, continue digoxin. Continue metoprolol. We'll get a two-dimensional echocardiogram.  3. History of  chronic atrial fibrillation-rate controlled. Continue Cardizem, metoprolol. -Continue aspirin.  4. Essential hypertension-continue Cardizem, metoprolol.  5. Depression-continue Seroquel.  All the records are reviewed and case discussed with ED provider. Management plans discussed with the patient, family and they are in agreement.  CODE STATUS: Limited Code  TOTAL Critical Care TIME TAKING CARE OF THIS PATIENT: 45 minutes.    Henreitta Leber M.D on 05/10/2017 at 12:08 PM  Between 7am to 6pm - Pager - [phone]  After 6pm go to [email or web address] - password [email or web address] [facility]  Office  [phone]  CC: Primary care physician; Juluis Pitch, MD

## 2017-05-10 NOTE — ED Provider Notes (Signed)
[facility] Provider Note   CSN: [id number] Arrival date & time: 05/10/17  1012     History   Chief Complaint Chief Complaint  Patient presents with  . Respiratory Distress    HPI Heather Barker is a 83 y.o. female history of atrial fibrillation, CHF with pulmonary edema, here presenting with shortness of breath, hypoxia. Patient is currently in the nursing home and was observed to have worsening shortness of breath this morning. Patient was admitted several months ago for acute pulmonary edema and required BiPAP. Patient was noted to be very hypoxic 65% on RA per EMS and placed on CPAP. No reported fevers. Patient has no DNR but request not be intubated today   The history is provided by the EMS personnel. The history is limited by the condition of the patient.   Level V caveat- condition of patient   Past Medical History:  Diagnosis Date  . Atrial fibrillation ([place])   . Basal cell cancer 1990's   forehead  . CHF (congestive heart failure) ([place])   . Depression   . Hypertension   . Osteoarthritis   . Psychosis   . Rosacea   . Stenosis, cervical spine   . Urinary incontinence     Patient Active Problem List   Diagnosis Date Noted  . Chronic diastolic heart failure ([place]) 03/17/2017  . Pressure injury of skin 03/05/2017  . Aspiration pneumonia ([place]) 12/04/2016  . Pneumonia 12/04/2016  . Dementia with psychosis 01/02/2016  . Hypertension 01/02/2016  . OSTEOARTHRITIS 07/04/2008  . LEG PAIN 09/28/2007  . DEPRESSION 07/20/2007  . ROSACEA 07/20/2007  . SPINAL STENOSIS, CERVICAL 07/20/2007  . URINARY INCONTINENCE 07/20/2007    Past Surgical History:  Procedure Laterality Date  . APPENDECTOMY    . CATARACT EXTRACTION     left eye  . CATARACT EXTRACTION  07/2010   right, IOL  . DILATION AND CURETTAGE OF UTERUS  1970's  . KNEE ARTHROSCOPY  1996   right   . ROTATOR CUFF REPAIR     left torn    OB History    No data available       Home  Medications    Prior to Admission medications   Medication Sig Start Date End Date Taking? Authorizing Provider  acetaminophen (TYLENOL) 500 MG tablet Take 500 mg by mouth 2 (two) times daily.    Yes [provider]  alum & mag hydroxide-simeth (MAALOX/MYLANTA) 200-200-20 MG/5ML suspension Take 30 mLs by mouth every 8 (eight) hours as needed for indigestion or heartburn.   Yes [provider]  aspirin EC 81 MG tablet Take 81 mg by mouth daily.   Yes [provider]  Biotin 10 MG TABS Take 10 mg by mouth daily.   Yes [provider]  digoxin (LANOXIN) 0.125 MG tablet Take 0.125 mg by mouth daily.   Yes [provider]  diltiazem (CARDIZEM CD) 240 MG 24 hr capsule Take 1 capsule (240 mg total) by mouth daily. 12/07/16  Yes Vaughan Basta, MD  furosemide (LASIX) 20 MG tablet Take 1 tablet (20 mg total) by mouth daily. 03/06/17  Yes Mody, Sital, MD  ipratropium-albuterol (DUONEB) 0.5-2.5 (3) MG/3ML SOLN Take 3 mLs by nebulization every 6 (six) hours as needed. 01/11/17  Yes Gouru, Aruna, MD  lidocaine (LIDODERM) 5 % Place 1 patch onto the skin every 12 (twelve) hours. Remove & Discard patch within 12 hours or as directed by MD   Yes [provider]  meclizine (ANTIVERT) 12.5  MG tablet Take 12.5 mg by mouth 3 (three) times daily. At 0800, 1400, and 2000   Yes [provider]  Menthol, Topical Analgesic, (BIOFREEZE) 4 % GEL Apply 1 application topically 2 (two) times daily.   Yes [provider]  metoprolol tartrate (LOPRESSOR) 25 MG tablet Take 0.5 tablets (12.5 mg total) by mouth 2 (two) times daily. 12/06/16  Yes Vaughan Basta, MD  metroNIDAZOLE (METROGEL) 1 % gel Apply 1 application topically 2 (two) times daily as needed.    Yes [provider]  Multiple Vitamin (MULTIVITAMIN) tablet Take 1 tablet by mouth daily.   Yes [provider]  ondansetron (ZOFRAN) 4 MG tablet Take 4 mg by mouth every 4  (four) hours as needed for nausea or vomiting.   Yes [provider]  QUEtiapine (SEROQUEL) 25 MG tablet Take 1 tablet (25 mg total) by mouth 2 (two) times daily. 12/06/16  Yes Vaughan Basta, MD  senna-docusate (SENOKOT-S) 8.6-50 MG tablet Take 1 tablet by mouth 2 (two) times daily as needed for mild constipation.   Yes [provider]    Family History Family History  Problem Relation Age of Onset  . Cancer Mother        lung  . Hypertension Mother   . Heart disease Father   . Hypertension Father   . Diabetes Neg Hx     Social History Social History  Substance Use Topics  . Smoking status: Never Smoker  . Smokeless tobacco: Never Used  . Alcohol use Yes     Comment: occasional     Allergies   Codeine phosphate; Meperidine hcl; and Penicillins   Review of Systems Review of Systems  Respiratory: Positive for shortness of breath.   All other systems reviewed and are negative.    Physical Exam Updated Vital Signs BP (!) 153/79 (BP Location: Right Arm)   Pulse 96   Temp (!) 95.8 F (35.4 C) (Other (Comment)) Comment (Src): temp foley  Resp (!) 28   Wt 137 lb 11.2 oz (62.5 kg)   SpO2 96%   BMI 25.19 kg/m   Physical Exam  Constitutional:  tachypneic, moderate respiratory distress, demented   HENT:  Head: Normocephalic.  Eyes: EOM are normal. Pupils are equal, round, and reactive to light.  Neck: Normal range of motion. Neck supple.  Cardiovascular: Normal rate, regular rhythm and normal heart sounds.   Pulmonary/Chest:  Rhonchi bilateral bases   Abdominal: Soft. Bowel sounds are normal. She exhibits no distension. There is no tenderness.  Musculoskeletal:  1+ edema bilaterally   Neurological: She is alert.  A & o x 1. Demented. Moving all extremities   Skin: Skin is warm.  Psychiatric:  Unable   Nursing note and vitals reviewed.    ED Treatments / Results  Labs (all labs ordered are listed, but only abnormal results are  displayed) Labs Reviewed  CBC WITH DIFFERENTIAL/PLATELET - Abnormal; Notable for the following:       Result Value   WBC 13.3 (*)    Hemoglobin 11.2 (*)    RDW 17.9 (*)    Neutro Abs 10.2 (*)    All other components within normal limits  COMPREHENSIVE METABOLIC PANEL - Abnormal; Notable for the following:    Sodium 133 (*)    Chloride 96 (*)    Glucose, Bld 224 (*)    Calcium 8.8 (*)    ALT 11 (*)    Total Bilirubin 1.5 (*)    All other components within  normal limits  BRAIN NATRIURETIC PEPTIDE - Abnormal; Notable for the following:    B Natriuretic Peptide 306.0 (*)    All other components within normal limits  URINALYSIS, COMPLETE (UACMP) WITH MICROSCOPIC - Abnormal; Notable for the following:    Color, Urine YELLOW (*)    APPearance HAZY (*)    Protein, ur 30 (*)    Leukocytes, UA TRACE (*)    Squamous Epithelial / LPF 0-5 (*)    All other components within normal limits  BLOOD GAS, ARTERIAL - Abnormal; Notable for the following:    pH, Arterial 7.32 (*)    pCO2 arterial 59 (*)    Bicarbonate 30.4 (*)    Acid-Base Excess 3.2 (*)    All other components within normal limits  LACTIC ACID, PLASMA - Abnormal; Notable for the following:    Lactic Acid, Venous 3.7 (*)    All other components within normal limits  CULTURE, BLOOD (ROUTINE X 2)  CULTURE, BLOOD (ROUTINE X 2)  TROPONIN I    EKG  EKG Interpretation None      ED ECG REPORT I, Wandra Arthurs, the attending physician, personally viewed and interpreted this ECG.   Date: 05/10/2017  EKG Time: 10:16 am  Rate: 93  Rhythm: normal EKG, normal sinus rhythm  Axis: normal  Intervals:none  ST&T Change: nonspecific    Radiology Dg Chest Port 1 View  Result Date: 05/10/2017 CLINICAL DATA:  Shortness of breath EXAM: PORTABLE CHEST 1 VIEW COMPARISON:  03/05/2017 FINDINGS: Low volume chest with perihilar hazy opacities that are greater than prior. There is cardiomegaly and small pleural effusions. IMPRESSION: 1.  Symmetric airspace disease favoring CHF. Small pleural effusions. 2. Low volume chest. Electronically Signed   By: Monte Fantasia M.D.   On: 05/10/2017 10:32    Procedures Procedures (including critical care time)  CRITICAL CARE Performed by: Wandra Arthurs   Total critical care time: 30 minutes  Critical care time was exclusive of separately billable procedures and treating other patients.  Critical care was necessary to treat or prevent imminent or life-threatening deterioration.  Critical care was time spent personally by me on the following activities: development of treatment plan with patient and/or surrogate as well as nursing, discussions with consultants, evaluation of patient's response to treatment, examination of patient, obtaining history from patient or surrogate, ordering and performing treatments and interventions, ordering and review of laboratory studies, ordering and review of radiographic studies, pulse oximetry and re-evaluation of patient's condition.   Medications Ordered in ED Medications  cefTRIAXone (ROCEPHIN) 1 g in dextrose 5 % 50 mL IVPB (not administered)  furosemide (LASIX) injection 40 mg (40 mg Intravenous Given 05/10/17 1021)  nitroGLYCERIN 50 mg in dextrose 5 % 250 mL (0.2 mg/mL) infusion (5 mcg/min Intravenous New Bag/Given 05/10/17 1027)     Initial Impression / Assessment and Plan / ED Course  I have reviewed the triage vital signs and the nursing notes.  Pertinent labs & imaging results that were available during my care of the patient were reviewed by me and considered in my medical decision making (see chart for details).    PHALLON HAYDU is a 83 y.o. female here with SOB, hypoxia. O2 was 82% on CPAP on arrival. Tachypneic, has rhonchi bilaterally. Concerned for pulmonary edema. Will get labs, BNP, CXR. Will switch to bipap and give lasix, nitro drip. Will place foley.   11:38 AM Lactate 3, unclear if from UTI or poor perfusion. BP dropped  to upper  90s with nitro drip so it was stopped. CXR showed pulmonary edema. PH is 7.32 with CO2 60 on bipap. Given lasix. Will admit to ICU for hypoxia respiratory distress, acute pulmonary edema.   Final Clinical Impressions(s) / ED Diagnoses   Final diagnoses:  None    New Prescriptions New Prescriptions   No medications on file     Drenda Freeze, MD 05/10/17 1139

## 2017-05-10 NOTE — Consult Note (Signed)
[facility] Consultation    SYNOPSIS   83 yo female admitted with acute resp failure due to pulm edema on bipap.   ASSESSMENT/PLAN   Acute hypoxic and hypercapnic respiratory failure with pulmonary edema.  --Agree with diuresis.  --Will check procalcitonin, if elevated would treat empirically with abx.   Chronic Afib, CHF, Htn.  --Continue PO cardizem.  --No chronic anticoagulation secondary to previous rectal bleeding on xarelto.   Limited Code-- DNR; intubation and ACLS meds OK.    MAJOR EVENTS/TEST RESULTS:   Best Practices  DVT Prophylaxis: enoxaparin.  GI Prophylaxis: --  ---------------------------------------  ---------------------------------------   Name: Heather Barker MRN: [id number] DOB: 1930/11/05    ADMISSION DATE:  05/10/2017 CONSULTATION DATE:  05/10/17   REFERRING MD :  Dr. Verdell Carmine for dyspnea  CHIEF COMPLAINT:  dyspnea   HISTORY OF PRESENT ILLNESS:   Patient is an 83 yo female brought via EMS from nursing home--[facility], her initial sat was 60% on RA, she significantly improved on bipap.  Currently the patient is on bipap and can not provide a history.  Review of labs shows acute hypoxic and hypercapnic respiratory failure with lactic acidosis. I personally review her CXR from 05/10/17 which shows reduced lung volumes, there is chronic right infiltrate when compared with previous, there is a new left sided infiltrate.  She was discharged from the hospital about 2 months ago with CHF.    PAST MEDICAL HISTORY :  Past Medical History:  Diagnosis Date  . Atrial fibrillation ([place])   . Basal cell cancer 1990's   forehead  . CHF (congestive heart failure) ([place])   . Depression   . Hypertension   . Osteoarthritis   . Psychosis   . Rosacea   . Stenosis, cervical spine   . Urinary incontinence    Past Surgical History:  Procedure Laterality Date  . APPENDECTOMY    . CATARACT EXTRACTION     left eye  .  CATARACT EXTRACTION  07/2010   right, IOL  . DILATION AND CURETTAGE OF UTERUS  1970's  . KNEE ARTHROSCOPY  1996   right   . ROTATOR CUFF REPAIR     left torn   Prior to Admission medications   Medication Sig Start Date End Date Taking? Authorizing Provider  acetaminophen (TYLENOL) 500 MG tablet Take 500 mg by mouth 2 (two) times daily.    Yes [provider]  alum & mag hydroxide-simeth (MAALOX/MYLANTA) 200-200-20 MG/5ML suspension Take 30 mLs by mouth every 8 (eight) hours as needed for indigestion or heartburn.   Yes [provider]  aspirin EC 81 MG tablet Take 81 mg by mouth daily.   Yes [provider]  Biotin 10 MG TABS Take 10 mg by mouth daily.   Yes [provider]  digoxin (LANOXIN) 0.125 MG tablet Take 0.125 mg by mouth daily.   Yes [provider]  diltiazem (CARDIZEM CD) 240 MG 24 hr capsule Take 1 capsule (240 mg total) by mouth daily. 12/07/16  Yes Vaughan Basta, MD  furosemide (LASIX) 20 MG tablet Take 1 tablet (20 mg total) by mouth daily. 03/06/17  Yes Mody, Sital, MD  ipratropium-albuterol (DUONEB) 0.5-2.5 (3) MG/3ML SOLN Take 3 mLs by nebulization every 6 (six) hours as needed. 01/11/17  Yes Gouru, Aruna, MD  lidocaine (LIDODERM) 5 % Place 1 patch onto the skin every 12 (twelve) hours. Remove & Discard patch within 12 hours or as directed by MD   Yes [provider]  meclizine (ANTIVERT) 12.5 MG tablet Take 12.5 mg by mouth 3 (three) times daily. At 0800, 1400, and 2000   Yes [provider]  Menthol, Topical Analgesic, (BIOFREEZE) 4 % GEL Apply 1 application topically 2 (two) times daily.   Yes [provider]  metoprolol tartrate (LOPRESSOR) 25 MG tablet Take 0.5 tablets (12.5 mg total) by mouth 2 (two) times daily. 12/06/16  Yes Vaughan Basta, MD  metroNIDAZOLE (METROGEL) 1 % gel Apply 1 application topically 2 (two) times daily as needed.    Yes [provider]  Multiple  Vitamin (MULTIVITAMIN) tablet Take 1 tablet by mouth daily.   Yes [provider]  ondansetron (ZOFRAN) 4 MG tablet Take 4 mg by mouth every 4 (four) hours as needed for nausea or vomiting.   Yes [provider]  QUEtiapine (SEROQUEL) 25 MG tablet Take 1 tablet (25 mg total) by mouth 2 (two) times daily. 12/06/16  Yes Vaughan Basta, MD  senna-docusate (SENOKOT-S) 8.6-50 MG tablet Take 1 tablet by mouth 2 (two) times daily as needed for mild constipation.   Yes [provider]   Allergies  Allergen Reactions  . Codeine Phosphate     REACTION: intolerance--hydrocodone okay  . Meperidine Hcl     REACTION: unspecified  . Penicillins     FAMILY HISTORY:  Family History  Problem Relation Age of Onset  . Cancer Mother        lung  . Hypertension Mother   . Heart disease Father   . Hypertension Father   . Diabetes Neg Hx    SOCIAL HISTORY:  reports that she has never smoked. She has never used smokeless tobacco. She reports that she drinks alcohol. She reports that she does not use drugs.  REVIEW OF SYSTEMS:   Could not obtain.    VITAL SIGNS: Temp:  [97.8 F (36.6 C)-98.6 F (37 C)] 98.6 F (37 C) (05/15 1515) Pulse Rate:  [49-96] 49 (05/15 1515) Resp:  [16-28] 16 (05/15 1515) BP: (99-153)/(59-86) 109/68 (05/15 1515) SpO2:  [86 %-100 %] 99 % (05/15 1515) FiO2 (%):  [50 %] 50 % (05/15 1116) Weight:  [137 lb 11.2 oz (62.5 kg)] 137 lb 11.2 oz (62.5 kg) (05/15 1016) HEMODYNAMICS:   VENTILATOR SETTINGS: FiO2 (%):  [50 %] 50 % INTAKE / OUTPUT:  Intake/Output Summary (Last 24 hours) at 05/10/17 1536 Last data filed at 05/10/17 1228  Gross per 24 hour  Intake            51.18 ml  Output                0 ml  Net            51.18 ml    Physical Examination:   VS: BP 109/68   Pulse (!) 49   Temp 98.6 F (37 C)   Resp 16   Wt 137 lb 11.2 oz (62.5 kg)   SpO2 99%   BMI 25.19 kg/m   General Appearance: No distress  Neuro:without focal  findings, mental status reduced.  HEENT: PERRLA, EOM intact, no ptosis, no other lesions noticed;  Pulmonary: normal breath sounds., diaphragmatic excursion normal. CardiovascularNormal S1,S2.  No m/r/g.    Abdomen: Benign, Soft, non-tender, No masses, hepatosplenomegaly, No lymphadenopathy Renal:  No costovertebral tenderness  GU:  Not performed at this time. Endoc: No evident thyromegaly, no signs of acromegaly. Skin:   warm, no rashes, no ecchymosis  Extremities: normal, no cyanosis, clubbing, no edema, warm  with normal capillary refill.    LABS: Reviewed   LABORATORY PANEL:   CBC  Recent Labs Lab 05/10/17 1016  WBC 13.3*  HGB 11.2*  HCT 35.1  PLT 266    Chemistries   Recent Labs Lab 05/10/17 1016  NA 133*  K 4.4  CL 96*  CO2 26  GLUCOSE 224*  BUN 18  CREATININE 0.78  CALCIUM 8.8*  AST 27  ALT 11*  ALKPHOS 99  BILITOT 1.5*    No results for input(s): GLUCAP in the last 168 hours.  Recent Labs Lab 05/10/17 1016  PHART 7.32*  PCO2ART 59*  PO2ART 84    Recent Labs Lab 05/10/17 1016  AST 27  ALT 11*  ALKPHOS 99  BILITOT 1.5*  ALBUMIN 3.8    Cardiac Enzymes  Recent Labs Lab 05/10/17 1016  TROPONINI <0.03    RADIOLOGY:  Dg Chest Port 1 View  Result Date: 05/10/2017 CLINICAL DATA:  Shortness of breath EXAM: PORTABLE CHEST 1 VIEW COMPARISON:  03/05/2017 FINDINGS: Low volume chest with perihilar hazy opacities that are greater than prior. There is cardiomegaly and small pleural effusions. IMPRESSION: 1. Symmetric airspace disease favoring CHF. Small pleural effusions. 2. Low volume chest. Electronically Signed   By: Monte Fantasia M.D.   On: 05/10/2017 10:32       --Marda Stalker, MD.  Board Certified in Internal Medicine, Pulmonary Medicine, [place], and Sleep Medicine.  ICU Pager [phone] [facility] Office Number: [phone]  Patricia Pesa, M.D.  Merton Border, M.D   05/10/2017,  3:36 PM

## 2017-05-10 NOTE — ED Notes (Signed)
Pt arrives to ER via [facility] from [facility]. EMS called for respiratory distress. EDP at bedside arrival. Pt oxygen 60% on RA upon EMS arrival. PT arrives on BIPAP, 86% on BIPAP. Pt color is much improved per [facility]. PT alert and oriented to self, pt answers questions.

## 2017-05-10 NOTE — Progress Notes (Signed)

## 2017-05-11 ENCOUNTER — Inpatient Hospital Stay
Admit: 2017-05-11 | Discharge: 2017-05-11 | Disposition: A | Discharge status: Home / Self Care | Payer: Medicare Other | Attending: Specialist | Admitting: Specialist

## 2017-05-11 LAB — BASIC METABOLIC PANEL
ANION GAP: 11 (ref 5–15)
BUN: 19 mg/dL (ref 6–20)
CHLORIDE: 93 mmol/L — AB (ref 101–111)
CO2: 33 mmol/L — ABNORMAL HIGH (ref 22–32)
Calcium: 8.6 mg/dL — ABNORMAL LOW (ref 8.9–10.3)
Creatinine, Ser: 0.72 mg/dL (ref 0.44–1.00)
GFR calc Af Amer: >60 mL/min (ref >60)
GFR calc non Af Amer: >60 mL/min (ref >60)
GLUCOSE: 78 mg/dL (ref 65–99)
POTASSIUM: 3.9 mmol/L (ref 3.5–5.1)
Sodium: 137 mmol/L (ref 135–145)

## 2017-05-11 LAB — CBC
HEMATOCRIT: 33.1 % — AB (ref 35.0–47.0)
Hemoglobin: 10.9 g/dL — ABNORMAL LOW (ref 12.0–16.0)
MCH: 27.2 pg (ref 26.0–34.0)
MCHC: 32.9 g/dL (ref 32.0–36.0)
MCV: 82.9 fL (ref 80.0–100.0)
Platelets: 215 10*3/uL (ref 150–440)
RBC: 3.99 MIL/uL (ref 3.80–5.20)
RDW: 17.8 % — ABNORMAL HIGH (ref 11.5–14.5)
WBC: 9.1 10*3/uL (ref 3.6–11.0)

## 2017-05-11 LAB — ECHOCARDIOGRAM COMPLETE
Height: 61 in
Weight: 2204.6 oz

## 2017-05-11 LAB — PROCALCITONIN: PROCALCITONIN: 0.42 ng/mL

## 2017-05-11 MED ORDER — DEXTROSE 5 % IV SOLN
1.0000 g | Freq: Every day | INTRAVENOUS | Status: DC
  Administered 2017-05-11 – 2017-05-14 (×4): 1 g via INTRAVENOUS
  Filled 2017-05-11 (×7): qty 10

## 2017-05-11 MED ORDER — DEXTROSE 5 % IV SOLN
1.0000 g | Freq: Every day | INTRAVENOUS | Status: DC
  Filled 2017-05-11: qty 10

## 2017-05-11 NOTE — Progress Notes (Signed)
Discussed with Dr. Ashby Dawes during rounds o2 saturation goals. MD gave order to keep o2 sats 88-92%.

## 2017-05-11 NOTE — Progress Notes (Signed)
[facility] Consultation    SYNOPSIS   83 yo female admitted with acute resp failure due to pulm edema on bipap.   ASSESSMENT/PLAN   Acute hypoxic and hypercapnic respiratory failure with pulmonary edema.  --Continue diuresis.  --Continue bipap at night. Wean off bipap during day.  --Procalcitonin negative, doubt pneumonia/infectious process.    Chronic Afib, CHF, Htn.  --Continue PO cardizem.  --No chronic anticoagulation secondary to previous rectal bleeding on xarelto.   Limited Code-- DNR; intubation and ACLS meds OK.    MAJOR EVENTS/TEST RESULTS:   Best Practices  DVT Prophylaxis: enoxaparin.  GI Prophylaxis: --  ---------------------------------------  ---------------------------------------   Name: Heather Barker MRN: [id number] DOB: 1930/05/18     Subjective: Pt currently on bipap, no new complaints. Feeling better.   REVIEW OF SYSTEMS:   Could not obtain due to bipap.    VITAL SIGNS: Temp:  [98.1 F (36.7 C)-100.6 F (38.1 C)] 100.6 F (38.1 C) (05/16 1300) Pulse Rate:  [49-97] 70 (05/16 1300) Resp:  [16-28] 24 (05/16 1300) BP: (90-144)/(44-86) 98/44 (05/16 1300) SpO2:  [90 %-99 %] 92 % (05/16 1300) FiO2 (%):  [40 %] 40 % (05/15 1930) Weight:  [137 lb 12.6 oz (62.5 kg)] 137 lb 12.6 oz (62.5 kg) (05/15 1549) HEMODYNAMICS:   VENTILATOR SETTINGS: FiO2 (%):  [40 %] 40 % INTAKE / OUTPUT:  Intake/Output Summary (Last 24 hours) at 05/11/17 1406 Last data filed at 05/11/17 1300  Gross per 24 hour  Intake              240 ml  Output             3745 ml  Net            -3505 ml    Physical Examination:   VS: BP (!) 98/44   Pulse 70   Temp (!) 100.6 F (38.1 C)   Resp (!) 24   Ht 5\' 1"  (1.549 m)   Wt 137 lb 12.6 oz (62.5 kg)   SpO2 92%   BMI 26.03 kg/m   General Appearance: No distress  Neuro:without focal findings, mental status normal.  HEENT: PERRLA, EOM intact, no ptosis, no other lesions noticed;    Pulmonary: normal breath sounds., scattered creps.  CardiovascularNormal S1,S2.  No m/r/g.    Abdomen: Benign, Soft, non-tender, No masses, hepatosplenomegaly, No lymphadenopathy Renal:  No costovertebral tenderness  GU:  Not performed at this time. Endoc: No evident thyromegaly, no signs of acromegaly. Skin:   warm, no rashes, no ecchymosis  Extremities: normal, no cyanosis, clubbing, no edema, warm with normal capillary refill.    LABS: Reviewed   LABORATORY PANEL:   CBC  Recent Labs Lab 05/11/17 0613  WBC 9.1  HGB 10.9*  HCT 33.1*  PLT 215    Chemistries   Recent Labs Lab 05/10/17 1016 05/11/17 0613  NA 133* 137  K 4.4 3.9  CL 96* 93*  CO2 26 33*  GLUCOSE 224* 78  BUN 18 19  CREATININE 0.78 0.72  CALCIUM 8.8* 8.6*  AST 27  --   ALT 11*  --   ALKPHOS 99  --   BILITOT 1.5*  --      Recent Labs Lab 05/10/17 1539  GLUCAP 76    Recent Labs Lab 05/10/17 1016  PHART 7.32*  PCO2ART 59*  PO2ART 84    Recent Labs Lab 05/10/17 1016  AST 27  ALT 11*  ALKPHOS 99  BILITOT 1.5*  ALBUMIN 3.8    Cardiac Enzymes  Recent Labs Lab 05/10/17 1016  TROPONINI <0.03    RADIOLOGY:  Dg Chest Port 1 View  Result Date: 05/10/2017 CLINICAL DATA:  Shortness of breath EXAM: PORTABLE CHEST 1 VIEW COMPARISON:  03/05/2017 FINDINGS: Low volume chest with perihilar hazy opacities that are greater than prior. There is cardiomegaly and small pleural effusions. IMPRESSION: 1. Symmetric airspace disease favoring CHF. Small pleural effusions. 2. Low volume chest. Electronically Signed   By: Monte Fantasia M.D.   On: 05/10/2017 10:32       --Marda Stalker, MD.  Board Certified in Internal Medicine, Pulmonary Medicine, [place], and Sleep Medicine.  ICU Pager [phone] [place] [facility] Office Number: [phone]  Patricia Pesa, M.D.  Merton Border, M.D   05/11/2017, 2:06 PM

## 2017-05-11 NOTE — Progress Notes (Signed)
Pt transferred to [facility] med surg room 217.  Report called to Abby, Therapist, sports. Pt transferred on telemetry and on oxygen.  VS stable.

## 2017-05-11 NOTE — Progress Notes (Signed)
Pt. Was taken off bipap and placed on 2 L nasal cannula.

## 2017-05-11 NOTE — Progress Notes (Signed)
Patient ID: Heather Barker, female   DOB: 09/16/1930, 83 y.o.   MRN: [id number]  [employer] PROGRESS NOTE  Heather Barker TDD:[id number] DOB: March 19, 1930 DOA: 05/10/2017 PCP: Juluis Pitch, MD  HPI/Subjective: Patient breathing a little bit better than when she came in. Some cough but nonproductive. She has not seen any weight gain or any swelling in her legs.  Objective: Vitals:   05/11/17 1440 05/11/17 1500  BP:  (!) 106/58  Pulse: (!) 56 (!) 59  Resp: (!) 22 (!) 27  Temp: 100 F (37.8 C) 100.2 F (37.9 C)    Filed Weights   05/10/17 1016 05/10/17 1549  Weight: 62.5 kg (137 lb 11.2 oz) 62.5 kg (137 lb 12.6 oz)    ROS: Review of Systems  Constitutional: Negative for chills and fever.  Eyes: Negative for blurred vision.  Respiratory: Positive for cough and shortness of breath.   Cardiovascular: Negative for chest pain.  Gastrointestinal: Negative for abdominal pain, constipation, diarrhea, nausea and vomiting.  Genitourinary: Negative for dysuria.  Musculoskeletal: Negative for joint pain.  Neurological: Negative for dizziness and headaches.   Exam: Physical Exam  Constitutional: She is oriented to person, place, and time.  HENT:  Nose: No mucosal edema.  Mouth/Throat: No oropharyngeal exudate or posterior oropharyngeal edema.  Eyes: Conjunctivae, EOM and lids are normal. Pupils are equal, round, and reactive to light.  Neck: No JVD present. Carotid bruit is not present. No edema present. No thyroid mass and no thyromegaly present.  Cardiovascular: S1 normal and S2 normal.  Exam reveals no gallop.   Murmur heard.  Systolic murmur is present with a grade of 2/6  Pulses:      Dorsalis pedis pulses are 2+ on the right side, and 2+ on the left side.  Respiratory: No respiratory distress. She has decreased breath sounds in the right middle field, the right lower field and the left middle field. She has no wheezes. She has no rhonchi. She has rales in the right  lower field and the left lower field.  GI: Soft. Bowel sounds are normal. There is no tenderness.  Musculoskeletal:       Right ankle: She exhibits no swelling.       Left ankle: She exhibits no swelling.  Lymphadenopathy:    She has no cervical adenopathy.  Neurological: She is alert and oriented to person, place, and time. No cranial nerve deficit.  Skin: Skin is warm. No rash noted. Nails show no clubbing.  Psychiatric: She has a normal mood and affect.      Data Reviewed: Basic Metabolic Panel:  Recent Labs Lab 05/10/17 1016 05/11/17 0613  NA 133* 137  K 4.4 3.9  CL 96* 93*  CO2 26 33*  GLUCOSE 224* 78  BUN 18 19  CREATININE 0.78 0.72  CALCIUM 8.8* 8.6*   Liver Function Tests:  Recent Labs Lab 05/10/17 1016  AST 27  ALT 11*  ALKPHOS 99  BILITOT 1.5*  PROT 7.1  ALBUMIN 3.8   CBC:  Recent Labs Lab 05/10/17 1016 05/11/17 0613  WBC 13.3* 9.1  NEUTROABS 10.2*  --   HGB 11.2* 10.9*  HCT 35.1 33.1*  MCV 85.3 82.9  PLT 266 215   Cardiac Enzymes:  Recent Labs Lab 05/10/17 1016  TROPONINI <0.03   BNP (last 3 results)  Recent Labs  03/04/17 0656 03/14/17 0515 05/10/17 1016  BNP 168.0* 248.0* 306.0*    CBG:  Recent Labs Lab 05/10/17 1539  GLUCAP 76  Recent Results (from the past 240 hour(s))  Blood culture (routine x 2)     Status: None (Preliminary result)   Collection Time: 05/10/17 10:25 AM  Result Value Ref Range Status   Specimen Description BLOOD L WRIST  Final   Special Requests   Final    BOTTLES DRAWN AEROBIC AND ANAEROBIC Blood Culture results may not be optimal due to an excessive volume of blood received in culture bottles   Culture NO GROWTH < 24 HOURS  Final   Report Status PENDING  Incomplete  Blood culture (routine x 2)     Status: None (Preliminary result)   Collection Time: 05/10/17 10:26 AM  Result Value Ref Range Status   Specimen Description BLOOD R WRIST  Final   Special Requests   Final    BOTTLES DRAWN  AEROBIC AND ANAEROBIC Blood Culture adequate volume   Culture NO GROWTH < 24 HOURS  Final   Report Status PENDING  Incomplete  MRSA PCR Screening     Status: None   Collection Time: 05/10/17  3:47 PM  Result Value Ref Range Status   MRSA by PCR NEGATIVE NEGATIVE Final    Comment:        The GeneXpert MRSA Assay (FDA approved for NASAL specimens only), is one component of a comprehensive MRSA colonization surveillance program. It is not intended to diagnose MRSA infection nor to guide or monitor treatment for MRSA infections.      Studies: Dg Chest Port 1 View  Result Date: 05/10/2017 CLINICAL DATA:  Shortness of breath EXAM: PORTABLE CHEST 1 VIEW COMPARISON:  03/05/2017 FINDINGS: Low volume chest with perihilar hazy opacities that are greater than prior. There is cardiomegaly and small pleural effusions. IMPRESSION: 1. Symmetric airspace disease favoring CHF. Small pleural effusions. 2. Low volume chest. Electronically Signed   By: Monte Fantasia M.D.   On: 05/10/2017 10:32    Scheduled Meds: . aspirin EC  81 mg Oral Daily  . digoxin  0.125 mg Oral Daily  . diltiazem  240 mg Oral Daily  . enoxaparin (LOVENOX) injection  40 mg Subcutaneous Q24H  . furosemide  40 mg Intravenous Q12H  . metoprolol tartrate  12.5 mg Oral BID  . QUEtiapine  25 mg Oral BID    Assessment/Plan:  1. Acute respiratory failure with hypoxia and hypercapnia. Patient initially required BiPAP. Patient now on 2.5 L nasal cannula. Patient can likely be transferred to the floor if blood pressure allows. 2. Acute on chronic diastolic congestive heart failure. Lasix 40 mg IV twice a day. Current echocardiogram pending. Likely will end up getting rid of Foley catheter tomorrow 3. Fever. Add on urine culture. Start Rocephin. Likely urinary tract infection source. Blood cultures are negative. 4. History of atrial fibrillation on digoxin, diltiazem and metoprolol 5. Weakness we'll get physical therapy evaluation  for tomorrow  Code Status:     Code Status Orders        Start     Ordered   05/10/17 1536  Limited resuscitation (code)  Continuous    Question Answer Comment  In the event of cardiac or respiratory ARREST: Initiate Code Blue, Call Rapid Response No   In the event of cardiac or respiratory ARREST: Perform CPR No   In the event of cardiac or respiratory ARREST: Perform Intubation/Mechanical Ventilation No   In the event of cardiac or respiratory ARREST: Use NIPPV/BiPAp only if indicated Yes   In the event of cardiac or respiratory ARREST: Administer ACLS medications if  indicated Yes   In the event of cardiac or respiratory ARREST: Perform Defibrillation or Cardioversion if indicated No      05/10/17 1535    Code Status History    Date Active Date Inactive Code Status Order ID Comments User Context   03/04/2017 11:22 AM 03/06/2017  6:41 PM Partial Code [id number]  Wilhelmina Mcardle, MD Inpatient   01/09/2017 12:14 PM 01/11/2017  7:57 PM Partial Code [id number]  Vaughan Basta, MD Inpatient   12/04/2016  9:42 AM 12/07/2016 12:52 AM Full Code [id number]  Saundra Shelling, MD Inpatient   12/04/2016  6:46 AM 12/04/2016  9:41 AM Full Code [id number]  Saundra Shelling, MD ED    Advance Directive Documentation     Most Recent Value  Type of Advance Directive  Healthcare Power of Attorney  Pre-existing out of facility DNR order (yellow form or pink MOST form)  -  "MOST" Form in Place?  -     Family Communication: As per critical care specialist Disposition Plan: To be determined based on clinical course  Consultants:  Critical care specialist  Antibiotics:  Rocephin  Time spent: 28 minutes. Case discussed with [facility] critical care team.  Loletha Grayer  [employer]

## 2017-05-11 NOTE — Progress Notes (Signed)
*  PRELIMINARY RESULTS* Echocardiogram 2D Echocardiogram has been performed.  [place], Cl[place] 05/11/2017, 2:08 PM

## 2017-05-11 NOTE — Progress Notes (Signed)
Pharmacy Antibiotic Note  Heather Barker is a 83 y.o. female with a h/o CHF admitted on 05/10/2017 with acute respiratory failure. Pharmacy has been consulted for ceftriaxone dosing.  Plan: Ceftriaxone 1 g iv q 24 hours.   Height: 5\' 1"  (154.9 cm) Weight: 137 lb 12.6 oz (62.5 kg) IBW/kg (Calculated) : 47.8  Temp (24hrs), Avg:99.2 F (37.3 C), Min:98.2 F (36.8 C), Max:100.6 F (38.1 C)   Recent Labs Lab 05/10/17 1016 05/10/17 1026 05/11/17 0613  WBC 13.3*  --  9.1  CREATININE 0.78  --  0.72  LATICACIDVEN  --  3.7*  --     Estimated Creatinine Clearance: 43.6 mL/min (by C-G formula based on SCr of 0.72 mg/dL).    Allergies  Allergen Reactions  . Codeine Phosphate     REACTION: intolerance--hydrocodone okay  . Meperidine Hcl     REACTION: unspecified  . Penicillins     Antimicrobials this admission: Ceftriaxone 5/15 >>   Dose adjustments this admission:   Microbiology results: 5/15 BCx: NGTD 5/15 UCx: sent  5/15 MRSA PCR: negative  Thank you for allowing pharmacy to be a part of this patient's care.  Heather Barker 05/11/2017 3:52 PM

## 2017-05-12 LAB — BASIC METABOLIC PANEL
ANION GAP: 8 (ref 5–15)
BUN: 25 mg/dL — ABNORMAL HIGH (ref 6–20)
CO2: 35 mmol/L — AB (ref 22–32)
Calcium: 8.2 mg/dL — ABNORMAL LOW (ref 8.9–10.3)
Chloride: 92 mmol/L — ABNORMAL LOW (ref 101–111)
Creatinine, Ser: 0.86 mg/dL (ref 0.44–1.00)
GFR calc non Af Amer: 60 mL/min — ABNORMAL LOW (ref >60)
Glucose, Bld: 100 mg/dL — ABNORMAL HIGH (ref 65–99)
POTASSIUM: 3.5 mmol/L (ref 3.5–5.1)
Sodium: 135 mmol/L (ref 135–145)

## 2017-05-12 LAB — URINE CULTURE: CULTURE: NO GROWTH

## 2017-05-12 LAB — DIGOXIN LEVEL: Digoxin Level: 0.8 ng/mL (ref 0.8–2.0)

## 2017-05-12 LAB — PROCALCITONIN: Procalcitonin: 0.34 ng/mL

## 2017-05-12 MED ORDER — LIDOCAINE 5 % EX PTCH
1.0000 | MEDICATED_PATCH | CUTANEOUS | Status: DC
  Administered 2017-05-12 – 2017-05-15 (×4): 1 via TRANSDERMAL
  Filled 2017-05-12 (×4): qty 1

## 2017-05-12 MED ORDER — DILTIAZEM HCL ER COATED BEADS 120 MG PO CP24
120.0000 mg | ORAL_CAPSULE | Freq: Every day | ORAL | Status: DC
Start: 2017-05-12 — End: 2017-05-15
  Administered 2017-05-12 – 2017-05-15 (×4): 120 mg via ORAL
  Filled 2017-05-12 (×4): qty 1

## 2017-05-12 MED ORDER — DILTIAZEM HCL ER COATED BEADS 120 MG PO CP24: 120.0000 mg | ORAL_CAPSULE | Freq: Every day | ORAL | Status: DC

## 2017-05-12 NOTE — Clinical Social Work Note (Signed)
Clinical Social Work Assessment  Patient Details  Name: Heather Barker MRN: [id number] Date of Birth: 07/20/1930  Date of referral:  05/12/17               Reason for consult:  Facility Placement                Permission sought to share information with:  Facility Art therapist granted to share information::  Yes, Verbal Permission Granted  Name::        Agency::     Relationship::     Contact Information:     Housing/Transportation Living arrangements for the past 2 months:  [place] of Information:  Patient Patient Interpreter Needed:  None Criminal Activity/Legal Involvement Pertinent to Current Situation/Hospitalization:  No - Comment as needed Significant Relationships:  Friend, Adult Children Lives with:  Facility Resident Do you feel safe going back to the place where you live?  Yes Need for family participation in patient care:  No (Coment)  Care giving concerns:  Patient is a long term resident at [facility].   Social Worker assessment / plan:  CSW received consult to notify that patient is from [facility]. CSW spoke with Broadus John at [facility] and patient is long term care there and can return at discharge. CSW met with patient this morning and introduced self and purpose of visit. Patient states she once lived at [facility] but transferred to [facility] but does not remember how long she has been there. Patient began talking to CSW about her family and that she had decided that she wanted to be a DNR but then changed her mind because of all of her young grandchildren that she has not yet seen. CSW provided supportive listening. Patient was concerned that if she did not discharge today that she would have to begin paying a bedhold at [facility]. CSW attempted to try and alleviate her concerns and told her that Broadus John at [facility] stated that there were plenty of beds and that they did not need her to do  a bed hold.   Employment status:  Retired Nurse, adult PT Recommendations:    Information / Referral to community resources:     Patient/Family's Response to care:  Patient expressed appreciation for CSW assistance.  Patient/Family's Understanding of and Emotional Response to Diagnosis, Current Treatment, and Prognosis:  Patient appeared relaxed and was eating some grits upon CSW visit. Patient is looking forward to discharging from hospital as soon as she is able.  Emotional Assessment Appearance:  Appears stated age Attitude/Demeanor/Rapport:   (pleasant and cooperative) Affect (typically observed):  Accepting, Adaptable, Pleasant, Calm Orientation:  Oriented to Self, Oriented to Place, Oriented to Situation, Oriented to  Time Alcohol / Substance use:  Not Applicable Psych involvement (Current and /or in the community):  No (Comment)  Discharge Needs  Concerns to be addressed:  Care Coordination Readmission within the last 30 days:  No Current discharge risk:  None Barriers to Discharge:  No Barriers Identified   Shela Leff, LCSW 05/12/2017, 11:11 AM

## 2017-05-12 NOTE — Care Management (Signed)
RNCM for noninvasive ventilation at night.  Patient from SNF.  CSW aware.  Please re consult if indicated.

## 2017-05-12 NOTE — Progress Notes (Addendum)
Patient ID: Heather Barker, female   DOB: [birth year], 83 y.o.   MRN: [id number]  [facility] PROGRESS NOTE  Heather Barker DJS:[id number] DOB: Nov 29, 1930 DOA: 05/10/2017 PCP: Juluis Pitch, MD  HPI/Subjective: Patient breathing a little bit better than when she came in. Some cough but nonproductive. She has not seen any weight gain or any swelling in her legs.  Objective: Vitals:   05/12/17 1234 05/12/17 1327  BP:  (!) 112/57  Pulse: 80 69  Resp:  18  Temp:  97.6 F (36.4 C)    Filed Weights   05/10/17 1016 05/10/17 1549  Weight: 62.5 kg (137 lb 11.2 oz) 62.5 kg (137 lb 12.6 oz)    ROS: Review of Systems  Constitutional: Negative for chills and fever.  Eyes: Negative for blurred vision.  Respiratory: Positive for cough and shortness of breath.   Cardiovascular: Negative for chest pain.  Gastrointestinal: Negative for abdominal pain, constipation, diarrhea, nausea and vomiting.  Genitourinary: Negative for dysuria.  Musculoskeletal: Negative for joint pain.  Neurological: Negative for dizziness and headaches.   Exam: Physical Exam  Constitutional: She is oriented to person, place, and time.  HENT:  Nose: No mucosal edema.  Mouth/Throat: No oropharyngeal exudate or posterior oropharyngeal edema.  Eyes: Conjunctivae, EOM and lids are normal. Pupils are equal, round, and reactive to light.  Neck: No JVD present. Carotid bruit is not present. No edema present. No thyroid mass and no thyromegaly present.  Cardiovascular: S1 normal and S2 normal.  Exam reveals no gallop.   Murmur heard.  Systolic murmur is present with a grade of 2/6  Pulses:      Dorsalis pedis pulses are 2+ on the right side, and 2+ on the left side.  Respiratory: No respiratory distress. She has decreased breath sounds in the right middle field, the right lower field and the left middle field. She has no wheezes. She has no rhonchi. She has rales in the right lower field and the left lower field.   GI: Soft. Bowel sounds are normal. There is no tenderness.  Musculoskeletal:       Right ankle: She exhibits no swelling.       Left ankle: She exhibits no swelling.  Lymphadenopathy:    She has no cervical adenopathy.  Neurological: She is alert and oriented to person, place, and time. No cranial nerve deficit.  Skin: Skin is warm. No rash noted. Nails show no clubbing.  Psychiatric: She has a normal mood and affect.      Data Reviewed: Basic Metabolic Panel:  Recent Labs Lab 05/10/17 1016 05/11/17 0613 05/12/17 0424  NA 133* 137 135  K 4.4 3.9 3.5  CL 96* 93* 92*  CO2 26 33* 35*  GLUCOSE 224* 78 100*  BUN 18 19 25*  CREATININE 0.78 0.72 0.86  CALCIUM 8.8* 8.6* 8.2*   Liver Function Tests:  Recent Labs Lab 05/10/17 1016  AST 27  ALT 11*  ALKPHOS 99  BILITOT 1.5*  PROT 7.1  ALBUMIN 3.8   CBC:  Recent Labs Lab 05/10/17 1016 05/11/17 0613  WBC 13.3* 9.1  NEUTROABS 10.2*  --   HGB 11.2* 10.9*  HCT 35.1 33.1*  MCV 85.3 82.9  PLT 266 215   Cardiac Enzymes:  Recent Labs Lab 05/10/17 1016  TROPONINI <0.03   BNP (last 3 results)  Recent Labs  03/04/17 0656 03/14/17 0515 05/10/17 1016  BNP 168.0* 248.0* 306.0*    CBG:  Recent Labs Lab 05/10/17 1539  GLUCAP  76    Recent Results (from the past 240 hour(s))  Urine culture     Status: None   Collection Time: 05/10/17 10:16 AM  Result Value Ref Range Status   Specimen Description URINE, RANDOM  Final   Special Requests NONE  Final   Culture   Final    NO GROWTH Performed at [facility], [address] [place], [place] [address]    Report Status 05/12/2017 FINAL  Final  Blood culture (routine x 2)     Status: None (Preliminary result)   Collection Time: 05/10/17 10:25 AM  Result Value Ref Range Status   Specimen Description BLOOD L WRIST  Final   Special Requests   Final    BOTTLES DRAWN AEROBIC AND ANAEROBIC Blood Culture results may not be optimal due to an excessive volume  of blood received in culture bottles   Culture NO GROWTH 2 DAYS  Final   Report Status PENDING  Incomplete  Blood culture (routine x 2)     Status: None (Preliminary result)   Collection Time: 05/10/17 10:26 AM  Result Value Ref Range Status   Specimen Description BLOOD R WRIST  Final   Special Requests   Final    BOTTLES DRAWN AEROBIC AND ANAEROBIC Blood Culture adequate volume   Culture NO GROWTH 2 DAYS  Final   Report Status PENDING  Incomplete  MRSA PCR Screening     Status: None   Collection Time: 05/10/17  3:47 PM  Result Value Ref Range Status   MRSA by PCR NEGATIVE NEGATIVE Final    Comment:        The GeneXpert MRSA Assay (FDA approved for NASAL specimens only), is one component of a comprehensive MRSA colonization surveillance program. It is not intended to diagnose MRSA infection nor to guide or monitor treatment for MRSA infections.       Scheduled Meds: . aspirin EC  81 mg Oral Daily  . digoxin  0.125 mg Oral Daily  . diltiazem  120 mg Oral Daily  . enoxaparin (LOVENOX) injection  40 mg Subcutaneous Q24H  . furosemide  40 mg Intravenous Q12H  . lidocaine  1 patch Transdermal Q24H  . QUEtiapine  25 mg Oral BID    Assessment/Plan:  1. Acute respiratory failure with hypoxia and hypercapnia. Patient initially required BiPAP. Patient now on 3 L nasal cannula. Patient with a candidate for noninvasive ventilation at facility secondary to high PCO2. Patient is hesitant on something and straps over her face. 2. Acute on chronic diastolic congestive heart failure. Lasix 40 mg IV twice a day. Heart rate to slow needed to hold beta blocker today.  Foley catheter discontinued. Repeat chest x-ray tomorrow 3. Fever. Added on urine culture. Started Rocephin yesterday. Likely urinary tract infection source. Blood cultures are negative. 4. atrial fibrillation with bradycardia on digoxin, diltiazem dose decreased and holding metoprolol today 5. Weakness. Physical therapy  evaluation 6. Severe pulmonary hypertension. Overall prognosis is poor with this.  Code Status:     Code Status Orders        Start     Ordered   05/10/17 1536  Limited resuscitation (code)  Continuous    Question Answer Comment  In the event of cardiac or respiratory ARREST: Initiate Code Blue, Call Rapid Response No   In the event of cardiac or respiratory ARREST: Perform CPR No   In the event of cardiac or respiratory ARREST: Perform Intubation/Mechanical Ventilation No   In the event of  cardiac or respiratory ARREST: Use NIPPV/BiPAp only if indicated Yes   In the event of cardiac or respiratory ARREST: Administer ACLS medications if indicated Yes   In the event of cardiac or respiratory ARREST: Perform Defibrillation or Cardioversion if indicated No      05/10/17 1535    Code Status History    Date Active Date Inactive Code Status Order ID Comments User Context   03/04/2017 11:22 AM 03/06/2017  6:41 PM Partial Code [id number]  Wilhelmina Mcardle, MD Inpatient   01/09/2017 12:14 PM 01/11/2017  7:57 PM Partial Code [id number]  Vaughan Basta, MD Inpatient   12/04/2016  9:42 AM 12/07/2016 12:52 AM Full Code [id number]  Saundra Shelling, MD Inpatient   12/04/2016  6:46 AM 12/04/2016  9:41 AM Full Code [id number]  Saundra Shelling, MD ED    Advance Directive Documentation     Most Recent Value  Type of Advance Directive  Healthcare Power of Attorney  Pre-existing out of facility DNR order (yellow form or pink MOST form)  -  "MOST" Form in Place?  -     Disposition Plan: Likely back to [facility] once respiratory status improved.  Consultants:  Critical care specialist  Antibiotics:  Rocephin  Time spent: 25 minutes  [place], [place]

## 2017-05-12 NOTE — Evaluation (Signed)
Physical Therapy Evaluation Patient Details Name: Heather Barker MRN: [id number] DOB: [birth year] Today's Date: 05/12/2017   History of Present Illness  83 yo female female with admission for acute respiratory failure and CHF, PNA and PMHx:  spinal stenosis c-spine, chronic O2 use, OA, HTN, depression, Urinary Incontinence, Psychosis, Atrial Fibrillation, and Rectal Bleeding secondary to Xarelto, Knee arthroscopy (1996); Rotator cuff repair.  Clinical Impression  Pt is getting initial visit, has been in SNF prior to coming to hospital and is appropriate to return there.  Her plan is to follow PT for strength and standing control and to restore gait as she will tolerate.  PT has presumably been seeing her at SNF, and hopefully can progress here to expedite her progress in SNF.    Follow Up Recommendations SNF    Equipment Recommendations  None recommended by PT    Recommendations for Other Services       Precautions / Restrictions Precautions Precautions: Fall Restrictions Weight Bearing Restrictions: No      Mobility  Bed Mobility Overal bed mobility: Needs Assistance Bed Mobility: Supine to Sit     Supine to sit: Mod assist     General bed mobility comments: cued hand placement adn assisted with bed pad to side of bed  Transfers Overall transfer level: Needs assistance   Transfers: Sit to/from Stand Sit to Stand: Mod assist         General transfer comment: Pt stood with assist and sidesteps to chair, flexed posture  Ambulation/Gait             General Gait Details: transfer to chair only   Stairs            Wheelchair Mobility    Modified Rankin (Stroke Patients Only)       Balance Overall balance assessment: Needs assistance Sitting-balance support: Feet supported Sitting balance-Leahy Scale: Fair     Standing balance support: Bilateral upper extremity supported Standing balance-Leahy Scale: Poor                                Pertinent Vitals/Pain Pain Assessment: No/denies pain    Home Living Family/patient expects to be discharged to:: Skilled nursing facility                      Prior Function Level of Independence: Needs assistance   Gait / Transfers Assistance Needed: nonambulatory recently  ADL's / Homemaking Assistance Needed: SNF care for all ADL's        Hand Dominance        Extremity/Trunk Assessment   Upper Extremity Assessment Upper Extremity Assessment: Generalized weakness    Lower Extremity Assessment Lower Extremity Assessment: Generalized weakness    Cervical / Trunk Assessment Cervical / Trunk Assessment: Kyphotic  Communication   Communication: HOH  Cognition Arousal/Alertness: Awake/alert Behavior During Therapy: Restless Overall Cognitive Status: No family/caregiver present to determine baseline cognitive functioning                                        General Comments General comments (skin integrity, edema, etc.): Pt is very HOH but agreeable to try to sit, but once in chair complains about spine and asks to lean on the table.  Talked with nursing about this and gave her pillows for lateral trunk support  Exercises     Assessment/Plan    PT Assessment Patient needs continued PT services  PT Problem List Decreased strength;Decreased range of motion;Decreased activity tolerance;Decreased balance;Decreased mobility;Decreased coordination;Decreased cognition;Decreased knowledge of use of DME;Decreased safety awareness;Decreased knowledge of precautions;Pain       PT Treatment Interventions DME instruction;Gait training;Functional mobility training;Therapeutic activities;Therapeutic exercise;Balance training;Neuromuscular re-education;Patient/family education    PT Goals (Current goals can be found in the Care Plan section)  Acute Rehab PT Goals Patient Stated Goal: none stated x to get spine more comfortable PT Goal  Formulation: Patient unable to participate in goal setting Time For Goal Achievement: 05/26/17 Potential to Achieve Goals: Good    Frequency Min 2X/week   Barriers to discharge   has accessible SNF to return    Co-evaluation               AM-PAC PT "6 Clicks" Daily Activity  Outcome Measure Difficulty turning over in bed (including adjusting bedclothes, sheets and blankets)?: Total Difficulty moving from lying on back to sitting on the side of the bed? : Total Difficulty sitting down on and standing up from a chair with arms (e.g., wheelchair, bedside commode, etc,.)?: Total Help needed moving to and from a bed to chair (including a wheelchair)?: A Lot Help needed walking in hospital room?: Total Help needed climbing 3-5 steps with a railing? : Total 6 Click Score: 7    End of Session Equipment Utilized During Treatment: Gait belt;Oxygen (extended line ) Activity Tolerance: Patient tolerated treatment well;Patient limited by pain Patient left: in chair;with call bell/phone within reach;with chair alarm set Nurse Communication: Mobility status PT Visit Diagnosis: Unsteadiness on feet (R26.81);Muscle weakness (generalized) (M62.81);Pain Pain - Right/Left: Right Pain - part of body:  (spine)    Time: 6962-9528 PT Time Calculation (min) (ACUTE ONLY): 23 min   Charges:   PT Evaluation $PT Eval Low Complexity: 1 Procedure PT Treatments $Therapeutic Activity: 8-22 mins   PT G Codes:   PT G-Codes **NOT FOR INPATIENT CLASS** Functional Assessment Tool Used: AM-PAC 6 Clicks Basic Mobility    Ramond Dial 05/12/2017, 3:50 PM  Mee Hives, PT MS Acute Rehab Dept. Number: [place] and [place]

## 2017-05-12 NOTE — NC FL2 (Signed)
[place] LEVEL OF CARE SCREENING TOOL     IDENTIFICATION  Patient Name: Heather Barker Birthdate: 03-Sep-1930 Sex: female Admission Date (Current Location): 05/10/2017  [facility] and [place] Number:  Engineering geologist and Address:  [facility], [address] [place], [place] [address]      Provider Number: [id number]  Attending Physician Name and Address:  Loletha Grayer, MD  Relative Name and Phone Number:       Current Level of Care: Hospital Recommended Level of Care: [place] Prior Approval Number:    Date Approved/Denied:   PASRR Number:    Discharge Plan: SNF    Current Diagnoses: Patient Active Problem List   Diagnosis Date Noted  . Acute respiratory failure with hypoxia ([place]) 05/10/2017  . Chronic diastolic heart failure ([place]) 03/17/2017  . Pressure injury of skin 03/05/2017  . Aspiration pneumonia ([place]) 12/04/2016  . Pneumonia 12/04/2016  . Dementia with psychosis 01/02/2016  . Hypertension 01/02/2016  . OSTEOARTHRITIS 07/04/2008  . LEG PAIN 09/28/2007  . DEPRESSION 07/20/2007  . ROSACEA 07/20/2007  . SPINAL STENOSIS, CERVICAL 07/20/2007  . URINARY INCONTINENCE 07/20/2007    Orientation RESPIRATION BLADDER Height & Weight     Self, Time, Situation, Place  Normal, O2 (4 liters) Incontinent Weight: 137 lb 12.6 oz (62.5 kg) Height:  5\' 1"  (154.9 cm)  BEHAVIORAL SYMPTOMS/MOOD NEUROLOGICAL BOWEL NUTRITION STATUS   (none)  (none) Continent Diet (cardiac)  AMBULATORY STATUS COMMUNICATION OF NEEDS Skin   Limited Assist Verbally Normal                       Personal Care Assistance Level of Assistance  Bathing, Feeding, Dressing Bathing Assistance: Limited assistance Feeding assistance: Limited assistance Dressing Assistance: Limited assistance     Functional Limitations Info  Sight Sight Info: Impaired        SPECIAL CARE FACTORS FREQUENCY                        Contractures Contractures Info: Not present    Additional Factors Info  Code Status, Allergies Code Status Info: partial Allergies Info: codeine phosphate; meperidine; pcns           Current Medications (05/12/2017):  This is the current hospital active medication list Current Facility-Administered Medications  Medication Dose Route Frequency Provider Last Rate Last Dose  . acetaminophen (TYLENOL) tablet 650 mg  650 mg Oral Q6H PRN Henreitta Leber, MD   650 mg at 05/11/17 2151   Or  . acetaminophen (TYLENOL) suppository 650 mg  650 mg Rectal Q6H PRN Henreitta Leber, MD      . alum & mag hydroxide-simeth (MAALOX/MYLANTA) 200-200-20 MG/5ML suspension 30 mL  30 mL Oral Q8H PRN Henreitta Leber, MD   30 mL at 05/11/17 2159  . aspirin EC tablet 81 mg  81 mg Oral Daily Henreitta Leber, MD   81 mg at 05/12/17 0950  . cefTRIAXone (ROCEPHIN) 1 g in dextrose 5 % 50 mL IVPB  1 g Intravenous q1800 Awilda Bill, NP   Stopped at 05/11/17 1903  . digoxin (LANOXIN) tablet 0.125 mg  0.125 mg Oral Daily Henreitta Leber, MD   0.125 mg at 05/12/17 0950  . diltiazem (CARDIZEM CD) 24 hr capsule 120 mg  120 mg Oral Daily Wieting, Richard, MD      . enoxaparin (LOVENOX) injection 40 mg  40 mg Subcutaneous Q24H [place], Vivek  J, MD   40 mg at 05/11/17 2159  . furosemide (LASIX) injection 40 mg  40 mg Intravenous Q12H Henreitta Leber, MD   40 mg at 05/12/17 0338  . ipratropium-albuterol (DUONEB) 0.5-2.5 (3) MG/3ML nebulizer solution 3 mL  3 mL Nebulization Q6H PRN Sainani, Belia Heman, MD      . ondansetron (ZOFRAN) tablet 4 mg  4 mg Oral Q6H PRN Henreitta Leber, MD       Or  . ondansetron (ZOFRAN) injection 4 mg  4 mg Intravenous Q6H PRN Henreitta Leber, MD      . QUEtiapine (SEROQUEL) tablet 25 mg  25 mg Oral BID Henreitta Leber, MD   25 mg at 05/12/17 0949  . senna-docusate (Senokot-S) tablet 1 tablet  1 tablet Oral BID PRN Henreitta Leber, MD         Discharge Medications: Please see  discharge summary for a list of discharge medications.  Relevant Imaging Results:  Relevant Lab Results:   Additional Information    Shela Leff, LCSW

## 2017-05-12 NOTE — Progress Notes (Signed)
Notified Dr. Leslye Peer of BP of 111/68 and pulse of 66. Per MD hold metoprolol. MD to adjust time of carvedilolol. And okay per MD to give digoxin.

## 2017-05-13 ENCOUNTER — Inpatient Hospital Stay: Payer: Medicare Other

## 2017-05-13 LAB — BASIC METABOLIC PANEL
Anion gap: 10 (ref 5–15)
BUN: 23 mg/dL — AB (ref 6–20)
CO2: 35 mmol/L — ABNORMAL HIGH (ref 22–32)
CREATININE: 0.76 mg/dL (ref 0.44–1.00)
Calcium: 8.7 mg/dL — ABNORMAL LOW (ref 8.9–10.3)
Chloride: 94 mmol/L — ABNORMAL LOW (ref 101–111)
Glucose, Bld: 97 mg/dL (ref 65–99)
POTASSIUM: 3.5 mmol/L (ref 3.5–5.1)
SODIUM: 139 mmol/L (ref 135–145)

## 2017-05-13 MED ORDER — POTASSIUM CHLORIDE CRYS ER 20 MEQ PO TBCR
40.0000 meq | EXTENDED_RELEASE_TABLET | Freq: Once | ORAL | Status: AC
  Administered 2017-05-13: 40 meq via ORAL
  Filled 2017-05-13: qty 2

## 2017-05-13 MED ORDER — AZITHROMYCIN 250 MG PO TABS
500.0000 mg | ORAL_TABLET | Freq: Every day | ORAL | Status: AC
  Administered 2017-05-13: 500 mg via ORAL
  Filled 2017-05-13: qty 2

## 2017-05-13 MED ORDER — LACTULOSE 10 GM/15ML PO SOLN
30.0000 g | Freq: Every day | ORAL | Status: DC | PRN
  Administered 2017-05-13: 30 g via ORAL
  Filled 2017-05-13: qty 60

## 2017-05-13 MED ORDER — POTASSIUM CHLORIDE CRYS ER 20 MEQ PO TBCR
20.0000 meq | EXTENDED_RELEASE_TABLET | Freq: Two times a day (BID) | ORAL | Status: DC
  Administered 2017-05-13 – 2017-05-15 (×4): 20 meq via ORAL
  Filled 2017-05-13 (×4): qty 1

## 2017-05-13 MED ORDER — AZITHROMYCIN 250 MG PO TABS
250.0000 mg | ORAL_TABLET | Freq: Every day | ORAL | Status: DC
  Administered 2017-05-14 – 2017-05-15 (×2): 250 mg via ORAL
  Filled 2017-05-13 (×2): qty 1

## 2017-05-13 MED ORDER — FUROSEMIDE 40 MG PO TABS
40.0000 mg | ORAL_TABLET | Freq: Two times a day (BID) | ORAL | Status: DC
  Administered 2017-05-13 – 2017-05-15 (×4): 40 mg via ORAL
  Filled 2017-05-13 (×4): qty 1

## 2017-05-13 NOTE — Consult Note (Signed)
   [facility] CM Inpatient Consult   05/13/2017  Heather Barker 18-Dec-1930 [id number]  Patient screened for potential [place] [facility] services. Patient is eligible for [place]. Electronic medical record reveals patient's discharge plan is to return to [facility] where she is a current long term resident. [facility] Care Management services not appropriate at this time. If patient's post hospital needs change please place a [facility] Care Management consult. For questions please contact:   Peyton Rossner RN, [facility] Liaison  [phone]) Business Mobile [phone]) Toll free office

## 2017-05-13 NOTE — Care Management Important Message (Signed)
Important Message  Patient Details  Name: Heather Barker MRN: [id number] Date of Birth: 02-Nov-1930   Medicare Important Message Given:  Yes    Beverly Sessions, RN 05/13/2017, 3:58 PM

## 2017-05-13 NOTE — Progress Notes (Signed)
Patient ID: Heather Barker, female   DOB: August 12, 1930, 83 y.o.   MRN: [id number]   [facility] PROGRESS NOTE  [name] DOB: Jul 10, 1930 DOA: 05/10/2017 PCP: Juluis Pitch, MD  HPI/Subjective: Patient feeling better. Breathing a little more comfortably. Still with old cough.  Objective: Vitals:   05/13/17 1055 05/13/17 1307  BP: 129/74 130/83  Pulse: 85 93  Resp:  20  Temp:  98.2 F (36.8 C)    Filed Weights   05/10/17 1016 05/10/17 1549  Weight: 62.5 kg (137 lb 11.2 oz) 62.5 kg (137 lb 12.6 oz)    ROS: Review of Systems  Constitutional: Negative for chills and fever.  Eyes: Negative for blurred vision.  Respiratory: Positive for cough and shortness of breath.   Cardiovascular: Negative for chest pain.  Gastrointestinal: Negative for abdominal pain, constipation, diarrhea, nausea and vomiting.  Genitourinary: Negative for dysuria.  Musculoskeletal: Negative for joint pain.  Neurological: Negative for dizziness and headaches.   Exam: Physical Exam  Constitutional: She is oriented to person, place, and time.  HENT:  Nose: No mucosal edema.  Mouth/Throat: No oropharyngeal exudate or posterior oropharyngeal edema.  Eyes: Conjunctivae, EOM and lids are normal. Pupils are equal, round, and reactive to light.  Neck: No JVD present. Carotid bruit is not present. No edema present. No thyroid mass and no thyromegaly present.  Cardiovascular: S1 normal and S2 normal.  Exam reveals no gallop.   Murmur heard.  Systolic murmur is present with a grade of 2/6  Pulses:      Dorsalis pedis pulses are 2+ on the right side, and 2+ on the left side.  Respiratory: No respiratory distress. She has decreased breath sounds in the right lower field and the left middle field. She has no wheezes. She has no rhonchi. She has rales in the right lower field and the left lower field.  GI: Soft. Bowel sounds are normal. There is no tenderness.  Musculoskeletal:   Right ankle: She exhibits no swelling.       Left ankle: She exhibits no swelling.  Lymphadenopathy:    She has no cervical adenopathy.  Neurological: She is alert and oriented to person, place, and time. No cranial nerve deficit.  Skin: Skin is warm. No rash noted. Nails show no clubbing.  Psychiatric: She has a normal mood and affect.      Data Reviewed: Basic Metabolic Panel:  Recent Labs Lab 05/10/17 1016 05/11/17 0613 05/12/17 0424 05/13/17 0510  NA 133* 137 135 139  K 4.4 3.9 3.5 3.5  CL 96* 93* 92* 94*  CO2 26 33* 35* 35*  GLUCOSE 224* 78 100* 97  BUN 18 19 25* 23*  CREATININE 0.78 0.72 0.86 0.76  CALCIUM 8.8* 8.6* 8.2* 8.7*   Liver Function Tests:  Recent Labs Lab 05/10/17 1016  AST 27  ALT 11*  ALKPHOS 99  BILITOT 1.5*  PROT 7.1  ALBUMIN 3.8   CBC:  Recent Labs Lab 05/10/17 1016 05/11/17 0613  WBC 13.3* 9.1  NEUTROABS 10.2*  --   HGB 11.2* 10.9*  HCT 35.1 33.1*  MCV 85.3 82.9  PLT 266 215   Cardiac Enzymes:  Recent Labs Lab 05/10/17 1016  TROPONINI <0.03   BNP (last 3 results)  Recent Labs  03/04/17 0656 03/14/17 0515 05/10/17 1016  BNP 168.0* 248.0* 306.0*    CBG:  Recent Labs Lab 05/10/17 1539  GLUCAP 76    Recent Results (from the past 240 hour(s))  Urine culture  Status: None   Collection Time: 05/10/17 10:16 AM  Result Value Ref Range Status   Specimen Description URINE, RANDOM  Final   Special Requests NONE  Final   Culture   Final    NO GROWTH Performed at [facility], [address] [place], [place] [address]    Report Status 05/12/2017 FINAL  Final  Blood culture (routine x 2)     Status: None (Preliminary result)   Collection Time: 05/10/17 10:25 AM  Result Value Ref Range Status   Specimen Description BLOOD L WRIST  Final   Special Requests   Final    BOTTLES DRAWN AEROBIC AND ANAEROBIC Blood Culture results may not be optimal due to an excessive volume of blood received in culture bottles    Culture NO GROWTH 3 DAYS  Final   Report Status PENDING  Incomplete  Blood culture (routine x 2)     Status: None (Preliminary result)   Collection Time: 05/10/17 10:26 AM  Result Value Ref Range Status   Specimen Description BLOOD R WRIST  Final   Special Requests   Final    BOTTLES DRAWN AEROBIC AND ANAEROBIC Blood Culture adequate volume   Culture NO GROWTH 3 DAYS  Final   Report Status PENDING  Incomplete  MRSA PCR Screening     Status: None   Collection Time: 05/10/17  3:47 PM  Result Value Ref Range Status   MRSA by PCR NEGATIVE NEGATIVE Final    Comment:        The GeneXpert MRSA Assay (FDA approved for NASAL specimens only), is one component of a comprehensive MRSA colonization surveillance program. It is not intended to diagnose MRSA infection nor to guide or monitor treatment for MRSA infections.      Studies: Dg Chest 2 View  Result Date: 05/13/2017 CLINICAL DATA:  Respiratory failure.  CHF.  Pneumonia. EXAM: CHEST  2 VIEW COMPARISON:  05/10/2017 . FINDINGS: Cardiomegaly again noted. Interim partial clearing of bilateral pulmonary infiltrates/edema. Bibasilar atelectasis . Small left pleural effusion. No pneumothorax. IMPRESSION: Cardiomegaly again noted. Partial clearing of bilateral pulmonary infiltrates/ edema. Small left pleural effusion. Electronically Signed   By: Marcello Moores  Register   On: 05/13/2017 09:05    Scheduled Meds: . aspirin EC  81 mg Oral Daily  . [START ON 05/14/2017] azithromycin  250 mg Oral Daily  . diltiazem  120 mg Oral Daily  . enoxaparin (LOVENOX) injection  40 mg Subcutaneous Q24H  . furosemide  40 mg Oral BID  . lidocaine  1 patch Transdermal Q24H  . potassium chloride  20 mEq Oral BID  . QUEtiapine  25 mg Oral BID    Assessment/Plan:  1. Acute respiratory failure with hypoxia and hypercapnia. Patient initially required BiPAP. Patient now on 2 L nasal cannula. Patient will likely not want noninvasive ventilation at facility. 2. Acute on  chronic diastolic congestive heart failure. Change Lasix 40 mg oral twice a day.  3. Fever, now resolved. Could be lung source with respiratory issues since urine culture was negative. Added Zithromax to Rocephin. 4. History of atrial fibrillation. 1.28 second pause today. Hold digoxin. Metoprolol stopped yesterday. On lower dose Cardizem. Watch heart rate the next day or so as digoxin gets out of the system. May need to restart metoprolol at that time. 5. Weakness. Appreciate physical therapy consultation 6. Severe pulmonary hypertension on echocardiogram.  Overall prognosis poor with this.  Code Status:     Code Status Orders  Start     Ordered   05/10/17 1536  Limited resuscitation (code)  Continuous    Question Answer Comment  In the event of cardiac or respiratory ARREST: Initiate Code Blue, Call Rapid Response No   In the event of cardiac or respiratory ARREST: Perform CPR No   In the event of cardiac or respiratory ARREST: Perform Intubation/Mechanical Ventilation No   In the event of cardiac or respiratory ARREST: Use NIPPV/BiPAp only if indicated Yes   In the event of cardiac or respiratory ARREST: Administer ACLS medications if indicated Yes   In the event of cardiac or respiratory ARREST: Perform Defibrillation or Cardioversion if indicated No      05/10/17 1535    Code Status History    Date Active Date Inactive Code Status Order ID Comments User Context   03/04/2017 11:22 AM 03/06/2017  6:41 PM Partial Code [id number]  Wilhelmina Mcardle, MD Inpatient   01/09/2017 12:14 PM 01/11/2017  7:57 PM Partial Code [id number]  Vaughan Basta, MD Inpatient   12/04/2016  9:42 AM 12/07/2016 12:52 AM Full Code [id number]  Saundra Shelling, MD Inpatient   12/04/2016  6:46 AM 12/04/2016  9:41 AM Full Code [id number]  Saundra Shelling, MD ED    Advance Directive Documentation     Most Recent Value  Type of Advance Directive  Healthcare Power of Attorney  Pre-existing out of facility DNR  order (yellow form or pink MOST form)  -  "MOST" Form in Place?  -     Family Communication: Spoke with POA at the bedside Disposition Plan:   potentially back to facility over the weekend  Consultants:  Critical care specialist signed off   Antibiotics:  Rocephin   and Zithromax  Time spent: 25 minutes  [place], [place]

## 2017-05-13 NOTE — Discharge Instructions (Signed)
Heart Failure Clinic appointment on May 24, 2017 at 9:40am with Darylene Price, [place]. Please call [phone] to reschedule.

## 2017-05-14 LAB — BASIC METABOLIC PANEL
Anion gap: 9 (ref 5–15)
BUN: 16 mg/dL (ref 6–20)
CO2: 34 mmol/L — AB (ref 22–32)
CREATININE: 0.68 mg/dL (ref 0.44–1.00)
Calcium: 8.9 mg/dL (ref 8.9–10.3)
Chloride: 97 mmol/L — ABNORMAL LOW (ref 101–111)
GFR calc Af Amer: >60 mL/min (ref >60)
GFR calc non Af Amer: >60 mL/min (ref >60)
GLUCOSE: 99 mg/dL (ref 65–99)
Potassium: 4.3 mmol/L (ref 3.5–5.1)
Sodium: 140 mmol/L (ref 135–145)

## 2017-05-14 LAB — MAGNESIUM: Magnesium: 2.2 mg/dL (ref 1.7–2.4)

## 2017-05-14 MED ORDER — TRAMADOL HCL 50 MG PO TABS
50.0000 mg | ORAL_TABLET | Freq: Four times a day (QID) | ORAL | Status: DC | PRN
  Administered 2017-05-15 (×2): 50 mg via ORAL
  Filled 2017-05-14 (×2): qty 1

## 2017-05-14 NOTE — Progress Notes (Signed)
[place] [place] NAME: Heather Barker    MR#:  [id number]  DATE OF BIRTH:  02/17/1930  SUBJECTIVE:  CHIEF COMPLAINT:   Chief Complaint  Patient presents with  . Respiratory Distress   - Admitted with diastolic CHF exacerbation, unable to tolerate BiPAP at bedtime. -Very anxious  REVIEW OF SYSTEMS:  Review of Systems  Constitutional: Negative for chills, fever and malaise/fatigue.  HENT: Negative for congestion, ear discharge, hearing loss and nosebleeds.   Eyes: Negative for blurred vision and double vision.  Respiratory: Positive for shortness of breath. Negative for cough and wheezing.   Cardiovascular: Negative for chest pain and palpitations.  Gastrointestinal: Negative for abdominal pain, constipation, diarrhea, nausea and vomiting.  Genitourinary: Negative for dysuria.  Musculoskeletal: Positive for back pain and myalgias.  Neurological: Negative for dizziness, sensory change, speech change, focal weakness, seizures and headaches.  Psychiatric/Behavioral: Negative for depression. The patient is nervous/anxious.     DRUG ALLERGIES:   Allergies  Allergen Reactions  . Codeine Phosphate     REACTION: intolerance--hydrocodone okay  . Meperidine Hcl     REACTION: unspecified  . Penicillins     VITALS:  Blood pressure 126/64, pulse 72, temperature 97.5 F (36.4 C), temperature source Oral, resp. rate 16, height 5\' 1"  (1.549 m), weight 62.5 kg (137 lb 12.6 oz), SpO2 95 %.  PHYSICAL EXAMINATION:  Physical Exam  GENERAL:  83 y.o.-year-old Anxious patient lying in the bed with no acute distress.  EYES: Pupils equal, round, reactive to light and accommodation. No scleral icterus. Extraocular muscles intact.  HEENT: Head atraumatic, normocephalic. Oropharynx and nasopharynx clear.  NECK:  Supple, no jugular venous distention. No thyroid enlargement, no tenderness.  LUNGS: Normal breath sounds bilaterally, no wheezing,  rales,rhonchi or crepitation. No use of accessory muscles of respiration. Decreased bibasilar breath sounds CARDIOVASCULAR: S1, S2 normal. No  rubs, or gallops. 2/6 systolic murmur is present ABDOMEN: Soft, nontender, nondistended. Bowel sounds present. No organomegaly or mass.  EXTREMITIES: No pedal edema, cyanosis, or clubbing.  NEUROLOGIC: Cranial nerves II through XII are intact. Muscle strength 5/5 in all extremities. Sensation intact. Gait not checked. Global weakness noted PSYCHIATRIC: The patient is alert and oriented x 3.  SKIN: No obvious rash, lesion, or ulcer.    LABORATORY PANEL:   CBC  Recent Labs Lab 05/11/17 0613  WBC 9.1  HGB 10.9*  HCT 33.1*  PLT 215   ------------------------------------------------------------------------------------------------------------------  Chemistries   Recent Labs Lab 05/10/17 1016  05/14/17 0402  NA 133*  < > 140  K 4.4  < > 4.3  CL 96*  < > 97*  CO2 26  < > 34*  GLUCOSE 224*  < > 99  BUN 18  < > 16  CREATININE 0.78  < > 0.68  CALCIUM 8.8*  < > 8.9  MG  --   --  2.2  AST 27  --   --   ALT 11*  --   --   ALKPHOS 99  --   --   BILITOT 1.5*  --   --   < > = values in this interval not displayed. ------------------------------------------------------------------------------------------------------------------  Cardiac Enzymes  Recent Labs Lab 05/10/17 1016  TROPONINI <0.03   ------------------------------------------------------------------------------------------------------------------  RADIOLOGY:  Dg Chest 2 View  Result Date: 05/13/2017 CLINICAL DATA:  Respiratory failure.  CHF.  Pneumonia. EXAM: CHEST  2 VIEW COMPARISON:  05/10/2017 . FINDINGS: Cardiomegaly again noted. Interim partial clearing of bilateral pulmonary  infiltrates/edema. Bibasilar atelectasis . Small left pleural effusion. No pneumothorax. IMPRESSION: Cardiomegaly again noted. Partial clearing of bilateral pulmonary infiltrates/ edema. Small left  pleural effusion. Electronically Signed   By: Marcello Moores  Register   On: 05/13/2017 09:05    EKG:   Orders placed or performed during the hospital encounter of 03/04/17  . EKG 12-Lead  . EKG 12-Lead    ASSESSMENT AND PLAN:   83 year old female with past medical history of atrial fibrillation, hypertension, chronic diastolic CHF, depression, urinary incontinence who presents to the hospital due to shortness of breath.  1. Acute respiratory failure with hypoxia and hypercapnia-secondary to decompensated CHF. -Initially admitted to ICU for continuous BiPAP. Much improved at this time. -Unable to tolerate BiPAP at night times. Continue nasal cannula oxygen support. -IV Lasix changed over to oral yesterday. -Also acute bronchitis. Currently on Rocephin and azithromycin.  2. Low back pain-acute on chronic. Exacerbated by her position. Adjust medications. We'll try to avoid narcotics given her delicate respiratory status.  3. History of chronic atrial fibrillation-rate controlled. Continue Cardizem. -Continue aspirin. However occasional 1-2 second pauses noted. Digoxin was held and metoprolol stopped. [facility] consult cardiology for the same. Likely outpatient management recommended.  4. Essential hypertension-continue Cardizem.  5. Depression-continue Seroquel.  physical therapy recommended rehabilitation. [facility]     All the records are reviewed and case discussed with Care Management/Social Workerr. Management plans discussed with the patient, family and they are in agreement.  CODE STATUS: Partial code  TOTAL TIME TAKING CARE OF THIS PATIENT: 38 minutes.   POSSIBLE D/C IN 1-2 DAYS, DEPENDING ON CLINICAL CONDITION.   Gladstone Lighter M.D on 05/14/2017 at 12:29 PM  Between 7am to 6pm - Pager - [phone]  After 6pm go to [email or web address] - password [email or web address] [facility]  Office  [phone]  CC: Primary care  physician; Juluis Pitch, MD

## 2017-05-14 NOTE — Progress Notes (Signed)
Physical Therapy Treatment Patient Details Name: Heather Barker MRN: [id number] DOB: 11/13/1930 Today's Date: 05/14/2017    History of Present Illness 83 yo female female with admission for acute respiratory failure and CHF, PNA and PMHx:  spinal stenosis c-spine, chronic O2 use, OA, HTN, depression, Urinary Incontinence, Psychosis, Atrial Fibrillation, and Rectal Bleeding secondary to Xarelto, Knee arthroscopy (1996); Rotator cuff repair.    PT Comments    Pt agreeable to PT for bed exercises. Denies pain currently. Pt participates in limited supine exercises with assist required. Fatigues quickly. Refuses further therapy attempts at this time. Continue PT to progress endurance and strength to improve functional mobility.    Follow Up Recommendations  SNF     Equipment Recommendations       Recommendations for Other Services       Precautions / Restrictions Precautions Precautions: Fall Restrictions Weight Bearing Restrictions: No    Mobility  Bed Mobility                  Transfers                 General transfer comment: Not tested; refuses out of bed  Ambulation/Gait                 Stairs            Wheelchair Mobility    Modified Rankin (Stroke Patients Only)       Balance                                            Cognition Arousal/Alertness: Awake/alert                                            Exercises General Exercises - Lower Extremity Ankle Circles/Pumps: AROM;Both;10 reps;Supine Quad Sets: Strengthening;Both;10 reps;Supine Gluteal Sets: Strengthening;Both;10 reps;Supine Short Arc Quad: AROM;Both;10 reps;Supine Heel Slides: AAROM;Both;10 reps;Supine Hip ABduction/ADduction: AAROM;Both;10 reps;Supine    General Comments        Pertinent Vitals/Pain Pain Assessment: No/denies pain    Home Living                      Prior Function            PT Goals  (current goals can now be found in the care plan section) Progress towards PT goals: Progressing toward goals (slowly)    Frequency           PT Plan Current plan remains appropriate    Co-evaluation              AM-PAC PT "6 Clicks" Daily Activity  Outcome Measure  Difficulty turning over in bed (including adjusting bedclothes, sheets and blankets)?: Total Difficulty moving from lying on back to sitting on the side of the bed? : Total Difficulty sitting down on and standing up from a chair with arms (e.g., wheelchair, bedside commode, etc,.)?: Total Help needed moving to and from a bed to chair (including a wheelchair)?: A Lot Help needed walking in hospital room?: Total Help needed climbing 3-5 steps with a railing? : Total 6 Click Score: 7    End of Session         PT Visit Diagnosis: Unsteadiness on feet (R26.81);Muscle weakness (  generalized) (M62.81);Pain     Time: 9688-6484 PT Time Calculation (min) (ACUTE ONLY): 17 min  Charges:  $Therapeutic Exercise: 8-22 mins                    G Codes:        Larae Grooms, PTA 05/14/2017, 12:16 PM

## 2017-05-15 LAB — BASIC METABOLIC PANEL
ANION GAP: 7 (ref 5–15)
BUN: 15 mg/dL (ref 6–20)
CALCIUM: 8.8 mg/dL — AB (ref 8.9–10.3)
CO2: 33 mmol/L — AB (ref 22–32)
CREATININE: 0.62 mg/dL (ref 0.44–1.00)
Chloride: 97 mmol/L — ABNORMAL LOW (ref 101–111)
GFR calc non Af Amer: >60 mL/min (ref >60)
Glucose, Bld: 105 mg/dL — ABNORMAL HIGH (ref 65–99)
Potassium: 4.6 mmol/L (ref 3.5–5.1)
SODIUM: 137 mmol/L (ref 135–145)

## 2017-05-15 LAB — CULTURE, BLOOD (ROUTINE X 2)
CULTURE: NO GROWTH
Culture: NO GROWTH
SPECIAL REQUESTS: ADEQUATE

## 2017-05-15 MED ORDER — POTASSIUM CHLORIDE CRYS ER 20 MEQ PO TBCR: 20.0000 meq | EXTENDED_RELEASE_TABLET | Freq: Every day | ORAL | 0 refills | Status: AC

## 2017-05-15 MED ORDER — AZITHROMYCIN 250 MG PO TABS: 250.0000 mg | ORAL_TABLET | Freq: Every day | ORAL | 0 refills | Status: DC

## 2017-05-15 MED ORDER — DILTIAZEM HCL ER COATED BEADS 120 MG PO CP24: 120.0000 mg | ORAL_CAPSULE | Freq: Every day | ORAL | 2 refills | Status: AC

## 2017-05-15 MED ORDER — TRAMADOL HCL 50 MG PO TABS: 50.0000 mg | ORAL_TABLET | Freq: Four times a day (QID) | ORAL | 0 refills | Status: AC | PRN

## 2017-05-15 MED ORDER — FUROSEMIDE 20 MG PO TABS: 20.0000 mg | ORAL_TABLET | Freq: Two times a day (BID) | ORAL | 0 refills | Status: AC

## 2017-05-15 MED ORDER — MECLIZINE HCL 12.5 MG PO TABS: 12.5000 mg | ORAL_TABLET | Freq: Three times a day (TID) | ORAL | 0 refills | Status: AC | PRN

## 2017-05-15 NOTE — Progress Notes (Signed)
05/15/2017  BP (!) 112/93 (BP Location: Left Arm)   Pulse 86   Temp 97.3 F (36.3 C) (Oral)   Resp 17   Ht 5\' 1"  (1.549 m)   Wt 62.5 kg (137 lb 12.6 oz)   SpO2 99%   BMI 26.03 kg/m  Patient discharged per MD orders. Discharge instructions reviewed with patient and patient verbalized understanding. Report called to Lelon Frohlich, RN at [facility]. IV removed per policy. Prescriptions discussed and sent with patient. Discharged via stretcher escorted by EMS with all belongings accompanying her.  Almedia Balls, RN

## 2017-05-15 NOTE — Consult Note (Signed)
[facility] Consultation Note  Patient ID: JANNA OAK, MRN: [id number], DOB/AGE: 04-26-1930 83 y.o. Admit date: 05/10/2017   Date of Consult: 05/15/2017 Primary Physician: Juluis Pitch, MD Primary Cardiologist: Ubaldo Glassing  Chief Complaint:  Chief Complaint  Patient presents with  . Respiratory Distress   Reason for Consult: acute on chronic diastolic dysfunction congestive heart failure  HPI: 83 y.o. female with known chronic diastolic dysfunction heart failure chronic nonvalvular atrial fibrillation essential hypertension who is had continued treatment at an outside facility for these issues for which she had significant increase in shortness of breath PND and orthopnea consistent with diastolic dysfunction heart failure. When seen in the emergency room she is had significant hypoxia and received intravenous Lasix and oxygen which improved her a great deal. She appears to be a significantly improved at this time and comfortable but still on 2 L of oxygen. She has had chronic nonvalvular atrial fibrillation for which she has had reasonable control with that heart rate diltiazem and stable today. Recent echocardiogram has shown the patient to have normal LV systolic function with severe biatrial enlargement with severe mitral and tricuspid regurgitation and significant pulmonary hypertension likely causing a lot of her shortness of breath. It does also appear that this may be an exacerbation of pulmonary inflammation and infection. Currently she is hemodynamically stable and improved  Past Medical History:  Diagnosis Date  . Atrial fibrillation ([place])   . Basal cell cancer 1990's   forehead  . CHF (congestive heart failure) ([place])   . Depression   . Hypertension   . Osteoarthritis   . Psychosis   . Rosacea   . Stenosis, cervical spine   . Urinary incontinence       Surgical History:  Past Surgical History:  Procedure Laterality Date  . APPENDECTOMY    . CATARACT  EXTRACTION     left eye  . CATARACT EXTRACTION  07/2010   right, IOL  . DILATION AND CURETTAGE OF UTERUS  1970's  . KNEE ARTHROSCOPY  1996   right   . ROTATOR CUFF REPAIR     left torn     Home Meds: Prior to Admission medications   Medication Sig Start Date End Date Taking? Authorizing Provider  acetaminophen (TYLENOL) 500 MG tablet Take 500 mg by mouth 2 (two) times daily.    Yes [provider]  alum & mag hydroxide-simeth (MAALOX/MYLANTA) 200-200-20 MG/5ML suspension Take 30 mLs by mouth every 8 (eight) hours as needed for indigestion or heartburn.   Yes [provider]  aspirin EC 81 MG tablet Take 81 mg by mouth daily.   Yes [provider]  Biotin 10 MG TABS Take 10 mg by mouth daily.   Yes [provider]  digoxin (LANOXIN) 0.125 MG tablet Take 0.125 mg by mouth daily.   Yes [provider]  diltiazem (CARDIZEM CD) 240 MG 24 hr capsule Take 1 capsule (240 mg total) by mouth daily. 12/07/16  Yes Vaughan Basta, MD  furosemide (LASIX) 20 MG tablet Take 1 tablet (20 mg total) by mouth daily. 03/06/17  Yes Mody, Sital, MD  ipratropium-albuterol (DUONEB) 0.5-2.5 (3) MG/3ML SOLN Take 3 mLs by nebulization every 6 (six) hours as needed. 01/11/17  Yes Gouru, Aruna, MD  lidocaine (LIDODERM) 5 % Place 1 patch onto the skin every 12 (twelve) hours. Remove & Discard patch within 12 hours or as directed by MD   Yes [provider]  meclizine (ANTIVERT) 12.5 MG tablet Take 12.5  mg by mouth 3 (three) times daily. At 0800, 1400, and 2000   Yes [provider]  Menthol, Topical Analgesic, (BIOFREEZE) 4 % GEL Apply 1 application topically 2 (two) times daily.   Yes [provider]  metoprolol tartrate (LOPRESSOR) 25 MG tablet Take 0.5 tablets (12.5 mg total) by mouth 2 (two) times daily. 12/06/16  Yes Vaughan Basta, MD  metroNIDAZOLE (METROGEL) 1 % gel Apply 1 application topically 2 (two) times daily as needed.     Yes [provider]  Multiple Vitamin (MULTIVITAMIN) tablet Take 1 tablet by mouth daily.   Yes [provider]  ondansetron (ZOFRAN) 4 MG tablet Take 4 mg by mouth every 4 (four) hours as needed for nausea or vomiting.   Yes [provider]  QUEtiapine (SEROQUEL) 25 MG tablet Take 1 tablet (25 mg total) by mouth 2 (two) times daily. 12/06/16  Yes Vaughan Basta, MD  senna-docusate (SENOKOT-S) 8.6-50 MG tablet Take 1 tablet by mouth 2 (two) times daily as needed for mild constipation.   Yes [provider]    Inpatient Medications:  . aspirin EC  81 mg Oral Daily  . azithromycin  250 mg Oral Daily  . diltiazem  120 mg Oral Daily  . enoxaparin (LOVENOX) injection  40 mg Subcutaneous Q24H  . furosemide  40 mg Oral BID  . lidocaine  1 patch Transdermal Q24H  . potassium chloride  20 mEq Oral BID  . QUEtiapine  25 mg Oral BID   . cefTRIAXone (ROCEPHIN)  IV Stopped (05/14/17 1808)    Allergies:  Allergies  Allergen Reactions  . Codeine Phosphate     REACTION: intolerance--hydrocodone okay  . Meperidine Hcl     REACTION: unspecified  . Penicillins     Social History   Social History  . Marital status: Widowed    Spouse name: N/A  . Number of children: 2  . Years of education: N/A   Occupational History  . retired housewife, Psychologist, occupational, [employer]    Social History Main Topics  . Smoking status: Never Smoker  . Smokeless tobacco: Never Used  . Alcohol use Yes     Comment: occasional  . Drug use: No  . Sexual activity: Not on file   Other Topics Concern  . Not on file   Social History Narrative   Widowed twice   Now with a gentleman friend again since 6/09   2 adoptedchildren: One living, one deceased           Family History  Problem Relation Age of Onset  . Cancer Mother        lung  . Hypertension Mother   . Heart disease Father   . Hypertension Father   . Diabetes Neg Hx      Review of Systems Positive  for Shortness of breath cough congestion Negative for: General:  chills, fever, night sweats or weight changes.  Cardiovascular: PND orthopnea syncope dizziness  Dermatological skin lesions rashes Respiratory: Positive for Cough congestion Urologic: Frequent urination urination at night and hematuria Abdominal: negative for nausea, vomiting, diarrhea, bright red blood per rectum, melena, or hematemesis Neurologic: negative for visual changes, and/or hearing changes  All other systems reviewed and are otherwise negative except as noted above.  Labs: No results for input(s): CKTOTAL, CKMB, TROPONINI in the last 72 hours. Lab Results  Component Value Date   WBC 9.1 05/11/2017   HGB 10.9 (L) 05/11/2017   HCT 33.1 (L) 05/11/2017   MCV 82.9  05/11/2017   PLT 215 05/11/2017    Recent Labs Lab 05/10/17 1016  05/15/17 0443  NA 133*  < > 137  K 4.4  < > 4.6  CL 96*  < > 97*  CO2 26  < > 33*  BUN 18  < > 15  CREATININE 0.78  < > 0.62  CALCIUM 8.8*  < > 8.8*  PROT 7.1  --   --   BILITOT 1.5*  --   --   ALKPHOS 99  --   --   ALT 11*  --   --   AST 27  --   --   GLUCOSE 224*  < > 105*  < > = values in this interval not displayed. No results found for: CHOL, HDL, LDLCALC, TRIG No results found for: DDIMER  Radiology/Studies:  Dg Chest 2 View  Result Date: 05/13/2017 CLINICAL DATA:  Respiratory failure.  CHF.  Pneumonia. EXAM: CHEST  2 VIEW COMPARISON:  05/10/2017 . FINDINGS: Cardiomegaly again noted. Interim partial clearing of bilateral pulmonary infiltrates/edema. Bibasilar atelectasis . Small left pleural effusion. No pneumothorax. IMPRESSION: Cardiomegaly again noted. Partial clearing of bilateral pulmonary infiltrates/ edema. Small left pleural effusion. Electronically Signed   By: Marcello Moores  Register   On: 05/13/2017 09:05   Dg Chest Port 1 View  Result Date: 05/10/2017 CLINICAL DATA:  Shortness of breath EXAM: PORTABLE CHEST 1 VIEW COMPARISON:  03/05/2017 FINDINGS: Low volume  chest with perihilar hazy opacities that are greater than prior. There is cardiomegaly and small pleural effusions. IMPRESSION: 1. Symmetric airspace disease favoring CHF. Small pleural effusions. 2. Low volume chest. Electronically Signed   By: Monte Fantasia M.D.   On: 05/10/2017 10:32    EKG: Atrial fibrillation with controlled ventricular rate and nonspecific ST changes with preventricular contraction  Weights: Filed Weights   05/10/17 1016 05/10/17 1549  Weight: 62.5 kg (137 lb 11.2 oz) 62.5 kg (137 lb 12.6 oz)     Physical Exam: Blood pressure (!) 112/93, pulse 86, temperature 97.3 F (36.3 C), temperature source Oral, resp. rate 17, height 5\' 1"  (1.549 m), weight 62.5 kg (137 lb 12.6 oz), SpO2 99 %. Body mass index is 26.03 kg/m. General: Well developed, well nourished, in no acute distress. Head eyes ears nose throat: Normocephalic, atraumatic, sclera non-icteric, no xanthomas, nares are without discharge. No apparent thyromegaly and/or mass  Lungs: Normal respiratory effort.  Diffuse wheezes, no rales, few rhonchi.  Heart: Irregular with normal S1 S2. no murmur gallop, no rub, PMI is normal size and placement, carotid upstroke normal without bruit, jugular venous pressure is normal Abdomen: Soft, non-tender, non-distended with normoactive bowel sounds. No hepatomegaly. No rebound/guarding. No obvious abdominal masses. Abdominal aorta is normal size without bruit Extremities: Trace edema. no cyanosis, no clubbing, no ulcers  Peripheral : 2+ bilateral upper extremity pulses, 2+ bilateral femoral pulses, 2+ bilateral dorsal pedal pulse Neuro: Alert and oriented. No facial asymmetry. No focal deficit. Moves all extremities spontaneously. Musculoskeletal: Normal muscle tone with kyphosis Psych:  Responds to questions appropriately with a normal affect.    Assessment: 83 year old female with acute on chronic diastolic dysfunction heart failure atrial fibrillation with controlled  ventricular rate and pulmonary inflammation infection slightly improved at this time without evidence of myocardial infarction  Plan: 1. Continue supportive care of the inflammatory infective the bronchitis and or pulmonary respiratory failure 2. Heart rate control of atrial fibrillation with diltiazem for goal heart rate between 60 and 90 bpm 3. Furosemide intravenously and possibly  changed to oral Lasix at this time for acute on chronic diastolic dysfunction heart failure 4. Begin ambulation and follow for further need in adjustments of medication management as recovering from infection 5. No further cardiac diagnostics necessary at this time  Signed, Corey Skains M.D. [facility] 05/15/2017, 6:24 AM

## 2017-05-15 NOTE — Discharge Summary (Signed)
[facility] [place] NAME: Lisel Siegrist    MR#:  [id number]  DATE OF BIRTH:  25-Dec-1930  DATE OF ADMISSION:  05/10/2017   ADMITTING PHYSICIAN: Henreitta Leber, MD  DATE OF DISCHARGE: 05/15/2017  PRIMARY CARE PHYSICIAN: Juluis Pitch, MD   ADMISSION DIAGNOSIS:   Acute pulmonary edema ([place]) [J81.0] Infective urethritis [N34.2]  DISCHARGE DIAGNOSIS:   Active Problems:   Acute respiratory failure with hypoxia ([place])   SECONDARY DIAGNOSIS:   Past Medical History:  Diagnosis Date  . Atrial fibrillation ([place])   . Basal cell cancer 1990's   forehead  . CHF (congestive heart failure) ([place])   . Depression   . Hypertension   . Osteoarthritis   . Psychosis   . Rosacea   . Stenosis, cervical spine   . Urinary incontinence     HOSPITAL COURSE:   83 year old female with past medical history of atrial fibrillation, hypertension, chronic diastolic CHF, depression, urinary incontinence who presents to the hospital due to shortness of breath.  1. Acute respiratory failure with hypoxia and hypercapnia-secondary to decompensated CHF. -Initially admitted to ICU for continuous BiPAP. Much improved at this time. -Unable to tolerate BiPAP at night times. Continue nasal cannula oxygen support. -IV Lasix changed over to oral now bid. -Also acute bronchitis. Discharge on azithromycin.  2. Low back pain-acute on chronic. Exacerbated by her position. Adjust medications. We'll try to avoid narcotics given her delicate respiratory status. Much relieved by tramadol and lidocaine patch  3. History of chronic atrial fibrillation-rate controlled. Continue Cardizem. -Continue aspirin. However occasional 1-2 second pauses noted. Digoxin was held and metoprolol stopped. -Appreciate cardiology consult. outpatient management recommended.  4. Essential hypertension-continue Cardizem.  5. Depression-continue Seroquel.  physical therapy  recommended rehabilitation. [facility]  DISCHARGE CONDITIONS:   Guarded CONSULTS OBTAINED:   Treatment Team:  Laverle Hobby, MD Corey Skains, MD  DRUG ALLERGIES:   Allergies  Allergen Reactions  . Codeine Phosphate     REACTION: intolerance--hydrocodone okay  . Meperidine Hcl     REACTION: unspecified  . Penicillins    DISCHARGE MEDICATIONS:   Allergies as of 05/15/2017      Reactions   Codeine Phosphate    REACTION: intolerance--hydrocodone okay   Meperidine Hcl    REACTION: unspecified   Penicillins       Medication List    STOP taking these medications   digoxin 0.125 MG tablet Commonly known as:  LANOXIN   metoprolol tartrate 25 MG tablet Commonly known as:  LOPRESSOR     TAKE these medications   acetaminophen 500 MG tablet Commonly known as:  TYLENOL Take 500 mg by mouth 2 (two) times daily.   alum & mag hydroxide-simeth 200-200-20 MG/5ML suspension Commonly known as:  MAALOX/MYLANTA Take 30 mLs by mouth every 8 (eight) hours as needed for indigestion or heartburn.   aspirin EC 81 MG tablet Take 81 mg by mouth daily.   azithromycin 250 MG tablet Commonly known as:  ZITHROMAX Take 1 tablet (250 mg total) by mouth daily. X 3 more days   BIOFREEZE 4 % Gel Generic drug:  Menthol (Topical Analgesic) Apply 1 application topically 2 (two) times daily.   Biotin 10 MG Tabs Take 10 mg by mouth daily.   diltiazem 120 MG 24 hr capsule Commonly known as:  CARDIZEM CD Take 1 capsule (120 mg total) by mouth daily. Start taking on:  05/16/2017 What changed:  medication strength  how much to take   furosemide 20 MG tablet Commonly known as:  LASIX Take 1 tablet (20 mg total) by mouth 2 (two) times daily. What changed:  when to take this   ipratropium-albuterol 0.5-2.5 (3) MG/3ML Soln Commonly known as:  DUONEB Take 3 mLs by nebulization every 6 (six) hours as needed.   lidocaine 5 % Commonly known as:   LIDODERM Place 1 patch onto the skin every 12 (twelve) hours. Remove & Discard patch within 12 hours or as directed by MD   meclizine 12.5 MG tablet Commonly known as:  ANTIVERT Take 1 tablet (12.5 mg total) by mouth 3 (three) times daily as needed for dizziness. At 0800, 1400, and 2000 What changed:  when to take this  reasons to take this   metroNIDAZOLE 1 % gel Commonly known as:  METROGEL Apply 1 application topically 2 (two) times daily as needed.   multivitamin tablet Take 1 tablet by mouth daily.   ondansetron 4 MG tablet Commonly known as:  ZOFRAN Take 4 mg by mouth every 4 (four) hours as needed for nausea or vomiting.   potassium chloride SA 20 MEQ tablet Commonly known as:  K-DUR,KLOR-CON Take 1 tablet (20 mEq total) by mouth daily.   QUEtiapine 25 MG tablet Commonly known as:  SEROQUEL Take 1 tablet (25 mg total) by mouth 2 (two) times daily.   senna-docusate 8.6-50 MG tablet Commonly known as:  Senokot-S Take 1 tablet by mouth 2 (two) times daily as needed for mild constipation.   traMADol 50 MG tablet Commonly known as:  ULTRAM Take 1 tablet (50 mg total) by mouth every 6 (six) hours as needed for moderate pain or severe pain.        DISCHARGE INSTRUCTIONS:   1. PCP follow-up in 1-2 weeks 2. Cardiology follow up in 2 weeks  DIET:   Cardiac diet  ACTIVITY:   Activity as tolerated  OXYGEN:   Home Oxygen: Yes.    Oxygen Delivery: 2 liters/min via Patient connected to nasal cannula oxygen  DISCHARGE LOCATION:   nursing home   If you experience worsening of your admission symptoms, develop shortness of breath, life threatening emergency, suicidal or homicidal thoughts you must seek medical attention immediately by calling 911 or calling your MD immediately  if symptoms less severe.  You Must read complete instructions/literature along with all the possible adverse reactions/side effects for all the Medicines you take and that have been  prescribed to you. Take any new Medicines after you have completely understood and accpet all the possible adverse reactions/side effects.   Please note  You were cared for by a hospitalist during your hospital stay. If you have any questions about your discharge medications or the care you received while you were in the hospital after you are discharged, you can call the unit and asked to speak with the hospitalist on call if the hospitalist that took care of you is not available. Once you are discharged, your primary care physician will handle any further medical issues. Please note that NO REFILLS for any discharge medications will be authorized once you are discharged, as it is imperative that you return to your primary care physician (or establish a relationship with a primary care physician if you do not have one) for your aftercare needs so that they can reassess your need for medications and monitor your lab values.    On the day of Discharge:  VITAL SIGNS:   Blood pressure (!) 112/93, pulse  86, temperature 97.3 F (36.3 C), temperature source Oral, resp. rate 17, height 5\' 1"  (1.549 m), weight 62.5 kg (137 lb 12.6 oz), SpO2 99 %.  PHYSICAL EXAMINATION:    GENERAL:  83 y.o.-year-old Anxious patient lying in the bed with no acute distress.  EYES: Pupils equal, round, reactive to light and accommodation. No scleral icterus. Extraocular muscles intact.  HEENT: Head atraumatic, normocephalic. Oropharynx and nasopharynx clear.  NECK:  Supple, no jugular venous distention. No thyroid enlargement, no tenderness.  LUNGS: Normal breath sounds bilaterally, no wheezing, rales,rhonchi or crepitation. No use of accessory muscles of respiration. Decreased bibasilar breath sounds CARDIOVASCULAR: S1, S2 normal. No  rubs, or gallops. 2/6 systolic murmur is present ABDOMEN: Soft, nontender, nondistended. Bowel sounds present. No organomegaly or mass.  EXTREMITIES: No pedal edema, cyanosis, or clubbing.   NEUROLOGIC: Cranial nerves II through XII are intact. Muscle strength 5/5 in all extremities. Sensation intact. Gait not checked. Global weakness noted PSYCHIATRIC: The patient is alert and oriented x 3.  SKIN: No obvious rash, lesion, or ulcer.   DATA REVIEW:   CBC  Recent Labs Lab 05/11/17 0613  WBC 9.1  HGB 10.9*  HCT 33.1*  PLT 215    Chemistries   Recent Labs Lab 05/10/17 1016  05/14/17 0402 05/15/17 0443  NA 133*  < > 140 137  K 4.4  < > 4.3 4.6  CL 96*  < > 97* 97*  CO2 26  < > 34* 33*  GLUCOSE 224*  < > 99 105*  BUN 18  < > 16 15  CREATININE 0.78  < > 0.68 0.62  CALCIUM 8.8*  < > 8.9 8.8*  MG  --   --  2.2  --   AST 27  --   --   --   ALT 11*  --   --   --   ALKPHOS 99  --   --   --   BILITOT 1.5*  --   --   --   < > = values in this interval not displayed.   Microbiology Results  Results for orders placed or performed during the hospital encounter of 05/10/17  Urine culture     Status: None   Collection Time: 05/10/17 10:16 AM  Result Value Ref Range Status   Specimen Description URINE, RANDOM  Final   Special Requests NONE  Final   Culture   Final    NO GROWTH Performed at [facility], [address], [place], [place] [address]    Report Status 05/12/2017 FINAL  Final  Blood culture (routine x 2)     Status: None   Collection Time: 05/10/17 10:25 AM  Result Value Ref Range Status   Specimen Description BLOOD L WRIST  Final   Special Requests   Final    BOTTLES DRAWN AEROBIC AND ANAEROBIC Blood Culture results may not be optimal due to an excessive volume of blood received in culture bottles   Culture NO GROWTH 5 DAYS  Final   Report Status 05/15/2017 FINAL  Final  Blood culture (routine x 2)     Status: None   Collection Time: 05/10/17 10:26 AM  Result Value Ref Range Status   Specimen Description BLOOD R WRIST  Final   Special Requests   Final    BOTTLES DRAWN AEROBIC AND ANAEROBIC Blood Culture adequate volume   Culture NO GROWTH  5 DAYS  Final   Report Status 05/15/2017 FINAL  Final  MRSA  PCR Screening     Status: None   Collection Time: 05/10/17  3:47 PM  Result Value Ref Range Status   MRSA by PCR NEGATIVE NEGATIVE Final    Comment:        The GeneXpert MRSA Assay (FDA approved for NASAL specimens only), is one component of a comprehensive MRSA colonization surveillance program. It is not intended to diagnose MRSA infection nor to guide or monitor treatment for MRSA infections.     RADIOLOGY:  No results found.   Management plans discussed with the patient, family and they are in agreement.  CODE STATUS:     Code Status Orders        Start     Ordered   05/10/17 1536  Limited resuscitation (code)  Continuous    Question Answer Comment  In the event of cardiac or respiratory ARREST: Initiate Code Blue, Call Rapid Response No   In the event of cardiac or respiratory ARREST: Perform CPR No   In the event of cardiac or respiratory ARREST: Perform Intubation/Mechanical Ventilation No   In the event of cardiac or respiratory ARREST: Use NIPPV/BiPAp only if indicated Yes   In the event of cardiac or respiratory ARREST: Administer ACLS medications if indicated Yes   In the event of cardiac or respiratory ARREST: Perform Defibrillation or Cardioversion if indicated No      05/10/17 1535    Code Status History    Date Active Date Inactive Code Status Order ID Comments User Context   03/04/2017 11:22 AM 03/06/2017  6:41 PM Partial Code [id number]  Wilhelmina Mcardle, MD Inpatient   01/09/2017 12:14 PM 01/11/2017  7:57 PM Partial Code [id number]  Vaughan Basta, MD Inpatient   12/04/2016  9:42 AM 12/07/2016 12:52 AM Full Code [id number]  Saundra Shelling, MD Inpatient   12/04/2016  6:46 AM 12/04/2016  9:41 AM Full Code [id number]  Saundra Shelling, MD ED    Advance Directive Documentation     Most Recent Value  Type of Advance Directive  Healthcare Power of Attorney  Pre-existing out of facility DNR order  (yellow form or pink MOST form)  -  "MOST" Form in Place?  -      TOTAL TIME TAKING CARE OF THIS PATIENT: 37 minutes.    Rumaldo Difatta M.D on 05/15/2017 at 8:58 AM  Between 7am to 6pm - Pager - [phone]  After 6pm go to [email or web address] - Proofreader  [facility]  Office  [phone]  CC: Primary care physician; Juluis Pitch, MD   Note: This dictation was prepared with Dragon dictation along with smaller phrase technology. Any transcriptional errors that result from this process are unintentional.

## 2017-05-19 ENCOUNTER — Other Ambulatory Visit: Payer: Self-pay | Admitting: *Deleted

## 2017-05-19 NOTE — Patient Outreach (Signed)
[facility]) Care Management [facility] Coordination  05/19/2017  Heather Barker May 01, 1930 [id number]   Spoke with Donnalee Curry, LCSW for [facility]. Reviewed patient case. She confirms that patient is a [place] resident of facility and there are no plans to discharge at this time.  RNCM will sign off case.  [name]. [name], RN, BSN, [facility] Care Coordinator [phone]  [name]. [name], RN, BSN, [place] [phone]) Business Cell  [phone]) Toll Free Office

## 2017-05-24 ENCOUNTER — Ambulatory Visit: Payer: Medicare Other | Admitting: Family

## 2017-05-24 ENCOUNTER — Telehealth: Payer: Self-pay | Admitting: Family

## 2017-05-24 ENCOUNTER — Other Ambulatory Visit
Admission: RE | Admit: 2017-05-24 | Discharge: 2017-05-24 | Disposition: A | Discharge status: Home / Self Care | Payer: Medicare Other | Source: Ambulatory Visit | Attending: Family Medicine | Admitting: Family Medicine

## 2017-05-24 DIAGNOSIS — I509 Heart failure, unspecified: Secondary | ICD-10-CM | POA: Insufficient documentation

## 2017-05-24 DIAGNOSIS — N390 Urinary tract infection, site not specified: Secondary | ICD-10-CM | POA: Diagnosis not present

## 2017-05-24 LAB — BRAIN NATRIURETIC PEPTIDE: B Natriuretic Peptide: 216 pg/mL — ABNORMAL HIGH (ref 0.0–100.0)

## 2017-05-24 NOTE — Telephone Encounter (Signed)
Patient did not show for her [facility] appointment on 05/24/17. Will attempt to reschedule.

## 2017-07-14 ENCOUNTER — Encounter: Payer: Self-pay | Admitting: Family

## 2017-07-14 ENCOUNTER — Ambulatory Visit: Payer: Medicare Other | Attending: Family | Admitting: Family

## 2017-07-14 VITALS — BP 125/70 | HR 97 | Resp 18 | Ht 62.0 in | Wt 120.2 lb

## 2017-07-14 DIAGNOSIS — Z79899 Other long term (current) drug therapy: Secondary | ICD-10-CM | POA: Insufficient documentation

## 2017-07-14 DIAGNOSIS — Z8249 Family history of ischemic heart disease and other diseases of the circulatory system: Secondary | ICD-10-CM | POA: Diagnosis not present

## 2017-07-14 DIAGNOSIS — Z801 Family history of malignant neoplasm of trachea, bronchus and lung: Secondary | ICD-10-CM | POA: Diagnosis not present

## 2017-07-14 DIAGNOSIS — Z85828 Personal history of other malignant neoplasm of skin: Secondary | ICD-10-CM | POA: Insufficient documentation

## 2017-07-14 DIAGNOSIS — M1990 Unspecified osteoarthritis, unspecified site: Secondary | ICD-10-CM | POA: Insufficient documentation

## 2017-07-14 DIAGNOSIS — I5032 Chronic diastolic (congestive) heart failure: Secondary | ICD-10-CM | POA: Diagnosis present

## 2017-07-14 DIAGNOSIS — L719 Rosacea, unspecified: Secondary | ICD-10-CM | POA: Diagnosis not present

## 2017-07-14 DIAGNOSIS — Z885 Allergy status to narcotic agent status: Secondary | ICD-10-CM | POA: Diagnosis not present

## 2017-07-14 DIAGNOSIS — I4891 Unspecified atrial fibrillation: Secondary | ICD-10-CM | POA: Diagnosis not present

## 2017-07-14 DIAGNOSIS — Z9981 Dependence on supplemental oxygen: Secondary | ICD-10-CM | POA: Insufficient documentation

## 2017-07-14 DIAGNOSIS — M4802 Spinal stenosis, cervical region: Secondary | ICD-10-CM | POA: Diagnosis not present

## 2017-07-14 DIAGNOSIS — I10 Essential (primary) hypertension: Secondary | ICD-10-CM

## 2017-07-14 DIAGNOSIS — I110 Hypertensive heart disease with heart failure: Secondary | ICD-10-CM | POA: Diagnosis not present

## 2017-07-14 DIAGNOSIS — Z880 Allergy status to penicillin: Secondary | ICD-10-CM | POA: Diagnosis not present

## 2017-07-14 DIAGNOSIS — Z7982 Long term (current) use of aspirin: Secondary | ICD-10-CM | POA: Insufficient documentation

## 2017-07-14 DIAGNOSIS — F329 Major depressive disorder, single episode, unspecified: Secondary | ICD-10-CM | POA: Insufficient documentation

## 2017-07-14 NOTE — Progress Notes (Signed)
Patient ID: Heather Barker, female    DOB: 07-24-1930, 83 y.o.   MRN: [id number]  HPI  Heather Barker is a 83 y/o female with a history of cervical stenosis, osteoarthritis, HTN, depression, atrial fibrillation and chronic heart failure.   Echo done 05/11/17 reviewed and showed an EF of 65-70% along with trivial AR, severe MR/TR. Previous echo was done 12/05/16 and it showed an EF of 60-65% along with trivial AR, moderate MR and mod/severe TR.   Admitted 05/10/17 due to HF exacerbation. Initially needed bipap and then transitioned to nasal cannula. IV lasix initially along with antibiotics for bronchitis. Cardiology consult obtained. Discharged to [facility] after 5 days.  Admitted 03/04/17 with sepsis due to pneumonia. Begun on levaquin. Initially needed bipap but then transitioned to nasal cannula. Discharged to SNF. Admitted 01/09/17 with acute on chronic respiratory failure secondary to pneumonia. Continued supplemental oxygen and IV antibiotics. Tested negative for influenza. Tested positive for MRSA. Had speech evaluation. Discharged to SNF. Admitted 12/04/16 with pneumonia and acute on chronic HF. Given broad spectrum antibiotics and IV diuretics. Discharged to SNF.   She presents today for her follow-up visit with a chief complaint of moderate shortness of breath with minimal exertion. She says that this is chronic in nature and has been present for years. She notes improvement with wearing her oxygen at 2L or when she stops to rest for short periods of time. She has associated fatigue and light-headedness along with this.   Past Medical History:  Diagnosis Date  . Atrial fibrillation ([place])   . Basal cell cancer 1990's   forehead  . CHF (congestive heart failure) ([place])   . Depression   . Hypertension   . Osteoarthritis   . Psychosis   . Rosacea   . Stenosis, cervical spine   . Urinary incontinence    Past Surgical History:  Procedure Laterality Date  . APPENDECTOMY    . CATARACT  EXTRACTION     left eye  . CATARACT EXTRACTION  07/2010   right, IOL  . DILATION AND CURETTAGE OF UTERUS  1970's  . KNEE ARTHROSCOPY  1996   right   . ROTATOR CUFF REPAIR     left torn   Family History  Problem Relation Age of Onset  . Cancer Mother        lung  . Hypertension Mother   . Heart disease Father   . Hypertension Father   . Diabetes Neg Hx    Social History  Substance Use Topics  . Smoking status: Never Smoker  . Smokeless tobacco: Never Used  . Alcohol use Yes     Comment: occasional   Allergies  Allergen Reactions  . Codeine Phosphate     REACTION: intolerance--hydrocodone okay  . Meperidine Hcl     REACTION: unspecified  . Penicillins    Prior to Admission medications   Medication Sig Start Date End Date Taking? Authorizing Provider  acetaminophen (TYLENOL) 500 MG tablet Take 500 mg by mouth 2 (two) times daily.    Yes [provider]  alum & mag hydroxide-simeth (MAALOX/MYLANTA) 200-200-20 MG/5ML suspension Take 30 mLs by mouth every 8 (eight) hours as needed for indigestion or heartburn.   Yes [provider]  aspirin EC 81 MG tablet Take 81 mg by mouth daily.   Yes [provider]  Biotin 10 MG TABS Take 10 mg by mouth daily.   Yes [provider]  Cholecalciferol (VITAMIN D3) 50000 units TABS Take 1  tablet by mouth every 30 (thirty) days.   Yes [provider]  diltiazem (CARDIZEM CD) 120 MG 24 hr capsule Take 1 capsule (120 mg total) by mouth daily. 05/16/17  Yes Gladstone Lighter, MD  furosemide (LASIX) 20 MG tablet Take 1 tablet (20 mg total) by mouth 2 (two) times daily. 05/15/17  Yes Gladstone Lighter, MD  ipratropium-albuterol (DUONEB) 0.5-2.5 (3) MG/3ML SOLN Take 3 mLs by nebulization every 6 (six) hours as needed. 01/11/17  Yes Gouru, Aruna, MD  lidocaine (LIDODERM) 5 % Place 1 patch onto the skin every 12 (twelve) hours. Remove & Discard patch within 12 hours or as directed by MD   Yes [provider]  meclizine (ANTIVERT) 12.5 MG tablet Take 1 tablet (12.5 mg total) by mouth 3 (three) times daily as needed for dizziness. At 0800, 1400, and 2000 05/15/17  Yes Gladstone Lighter, MD  Menthol, Topical Analgesic, (BIOFREEZE) 4 % GEL Apply 1 application topically 2 (two) times daily.   Yes [provider]  metroNIDAZOLE (METROGEL) 1 % gel Apply 1 application topically 2 (two) times daily as needed.    Yes [provider]  Multiple Vitamin (MULTIVITAMIN) tablet Take 1 tablet by mouth daily.   Yes [provider]  ondansetron (ZOFRAN) 4 MG tablet Take 4 mg by mouth every 4 (four) hours as needed for nausea or vomiting.   Yes [provider]  potassium chloride SA (K-DUR,KLOR-CON) 20 MEQ tablet Take 1 tablet (20 mEq total) by mouth daily. 05/15/17  Yes Gladstone Lighter, MD  QUEtiapine (SEROQUEL) 25 MG tablet Take 1 tablet (25 mg total) by mouth 2 (two) times daily. 12/06/16  Yes Vaughan Basta, MD  senna-docusate (SENOKOT-S) 8.6-50 MG tablet Take 1 tablet by mouth 2 (two) times daily as needed for mild constipation.   Yes [provider]  traMADol (ULTRAM) 50 MG tablet Take 1 tablet (50 mg total) by mouth every 6 (six) hours as needed for moderate pain or severe pain. 05/15/17  Yes Gladstone Lighter, MD   Review of Systems  Constitutional: Positive for fatigue. Negative for appetite change.  HENT: Negative for congestion, postnasal drip and sore throat.   Eyes: Negative.   Respiratory: Positive for shortness of breath (wearing oxygen at 2L). Negative for cough, chest tightness and wheezing.   Cardiovascular: Negative for chest pain, palpitations and leg swelling (wearing TED hose).  Gastrointestinal: Negative for abdominal distention and abdominal pain.  Endocrine: Negative.   Genitourinary: Negative.   Musculoskeletal: Positive for back pain. Negative for neck stiffness.  Skin: Negative.   Allergic/Immunologic: Negative.    Neurological: Positive for light-headedness. Negative for dizziness.  Hematological: Negative for adenopathy. Does not bruise/bleed easily.  Psychiatric/Behavioral: Negative for dysphoric mood, sleep disturbance (wearing oxygen at 2L around the clock) and suicidal ideas. The patient is not nervous/anxious.    Vitals:   07/14/17 1017  BP: 125/70  Pulse: 97  Resp: 18  SpO2: 99%  Weight: 120 lb 4 oz (54.5 kg)  Height: 5\' 2"  (1.575 m)   Wt Readings from Last 3 Encounters:  07/14/17 120 lb 4 oz (54.5 kg)  05/10/17 137 lb 12.6 oz (62.5 kg)  04/14/17 129 lb 6 oz (58.7 kg)    Lab Results  Component Value Date   CREATININE 0.62 05/15/2017   CREATININE 0.68 05/14/2017   CREATININE 0.76 05/13/2017    Physical Exam  Constitutional: She is oriented to person, place, and time. She appears well-developed and well-nourished.  HENT:  Head: Normocephalic and atraumatic.  Neck: Normal range of motion. Neck supple. No JVD present.  Cardiovascular: Normal rate and regular rhythm.   Pulmonary/Chest: Effort normal. She has no wheezes. She has no rales.  Abdominal: Soft. She exhibits no distension. There is no tenderness.  Musculoskeletal: She exhibits no edema or tenderness.  Neurological: She is alert and oriented to person, place, and time.  Skin: Skin is warm and dry.  Psychiatric: She has a normal mood and affect. Her behavior is normal. Thought content normal.  Nursing note and vitals reviewed.    Assessment & Plan:  1: Chronic heart failure with preserved ejection fraction- - NYHA class III - euvolemic today - being weighed daily and reminded her to have the staff call for an overnight weight gain of >2 pounds or a weekly weight gain of >5 pounds - adding "some" salt to grits and she was encouraged to not add any salt to her food - does have support socks on - wears oxygen at 2L around the clock - use to see Dr. Ubaldo Glassing but POA says that she now has to pay out of pocket to see him  since [facility] has multiple doctors available  2: HTN- - BP looks good today - sees Dr. Lovie Macadamia for her primary care needs while at [facility] and last saw him 05/23/17  SNF medication list was reviewed.  Return here in 3 months or sooner for any questions/problems before then.

## 2017-07-14 NOTE — Patient Instructions (Signed)
Continue weighing daily and call for an overnight weight gain of > 2 pounds or a weekly weight gain of >5 pounds. 

## 2017-08-18 IMAGING — DX DG CHEST 1V PORT
1 series · 1 of 1 positions shown · non-contrast
Comparison: 01/09/2017

CLINICAL DATA: Shortness of breath and cough.

EXAM:
PORTABLE CHEST 1 VIEW

[chest ap]
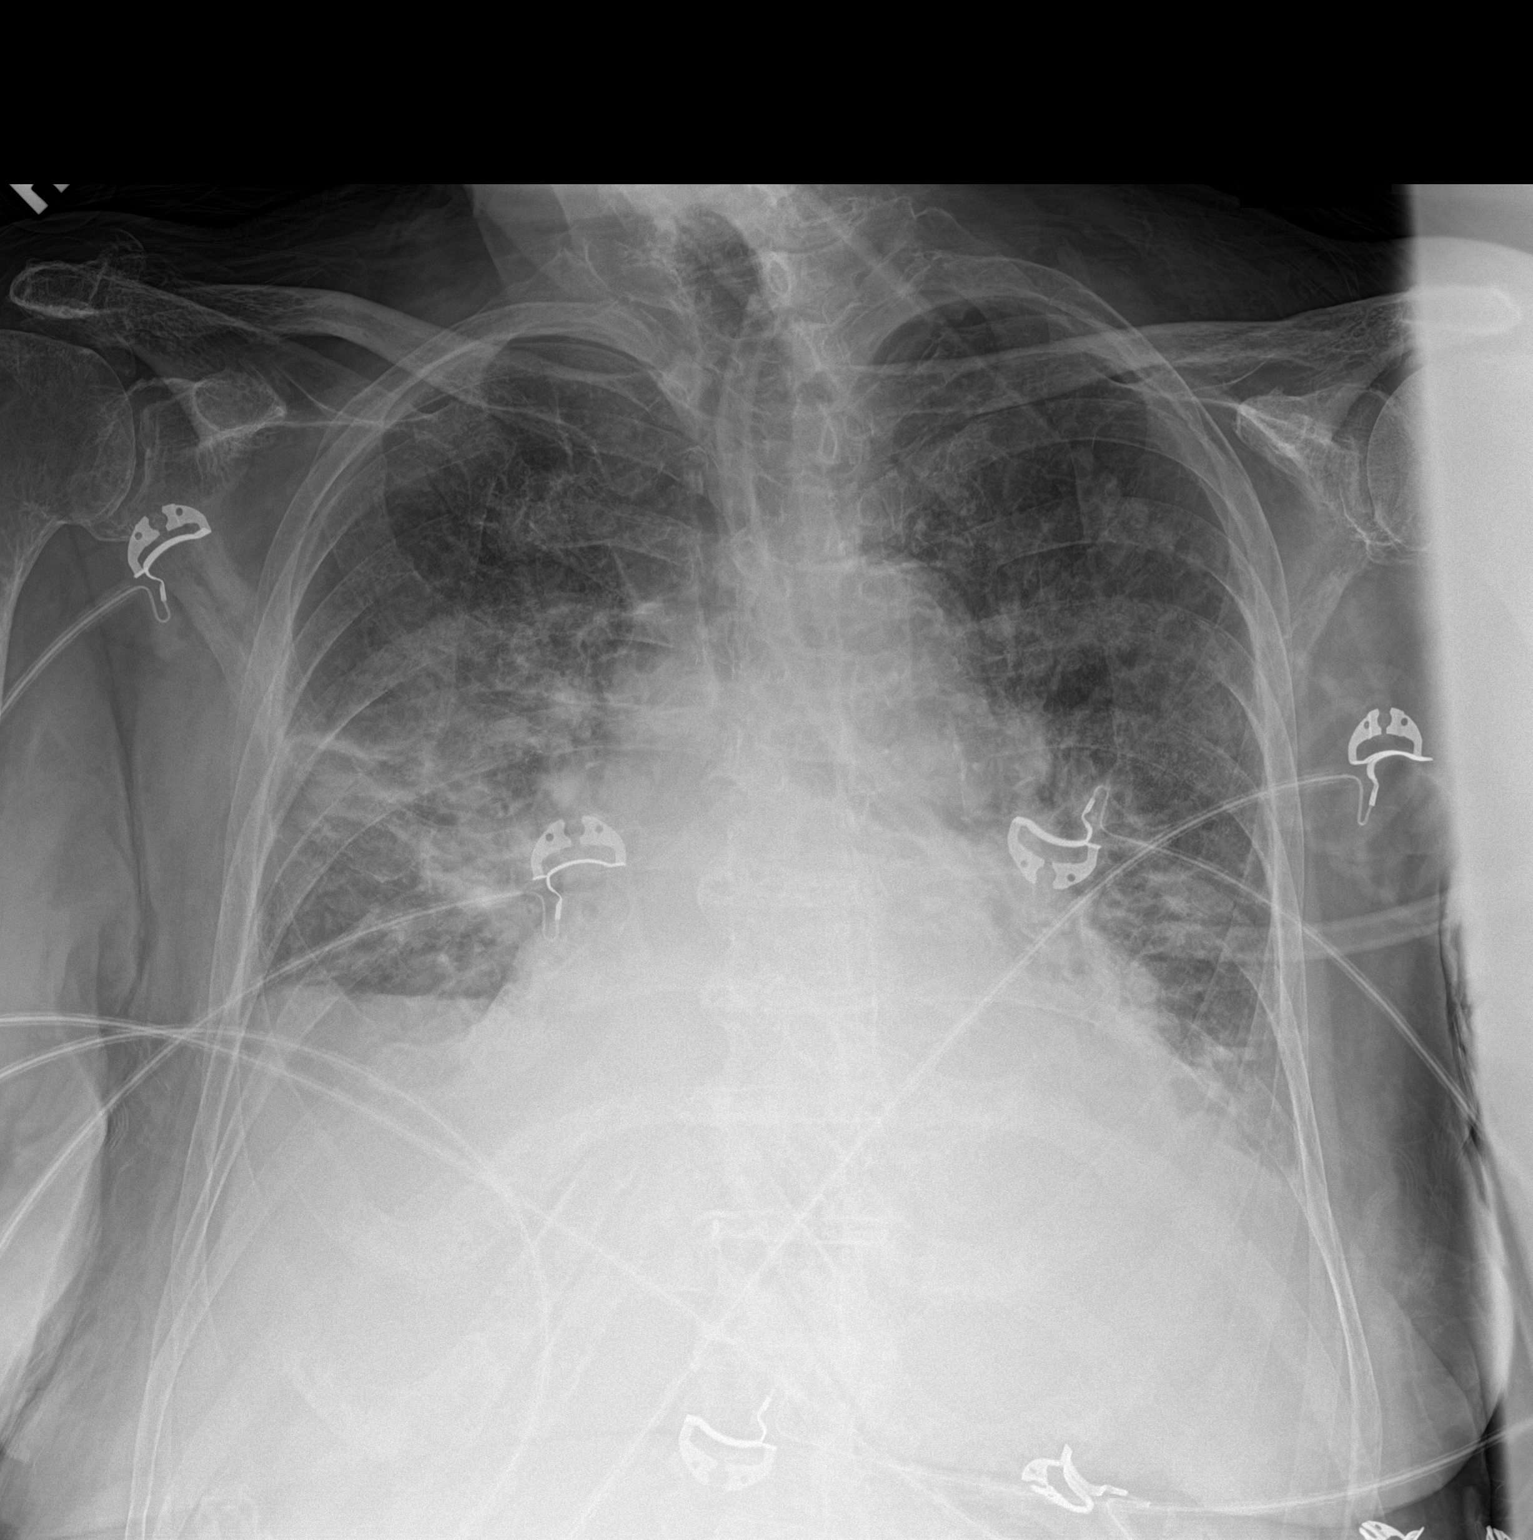

[1 of 1 positions shown; findings below may reference images not displayed]

FINDINGS: Increased patchy densities in the right hilum and mid right lung.
Improved aeration in the left upper lung compared to the previous
examination. Densities at the left lung base concerning for pleural
fluid and/or consolidation. Heart size appears to be enlarged and
stable. Atherosclerotic calcifications at the aortic arch.
IMPRESSION: Bilateral patchy lung densities. Worsening disease in the right lung
with some improvement in the left upper lung. Findings are
concerning for airspace disease and pneumonia.

Probable pleural effusions.

## 2017-09-01 ENCOUNTER — Other Ambulatory Visit
Admission: RE | Admit: 2017-09-01 | Discharge: 2017-09-01 | Disposition: A | Discharge status: Home / Self Care | Payer: Medicare Other | Source: Ambulatory Visit | Attending: Family Medicine | Admitting: Family Medicine

## 2017-09-01 DIAGNOSIS — I509 Heart failure, unspecified: Secondary | ICD-10-CM | POA: Diagnosis present

## 2017-09-01 LAB — BRAIN NATRIURETIC PEPTIDE: B Natriuretic Peptide: 226 pg/mL — ABNORMAL HIGH (ref 0.0–100.0)

## 2017-10-20 ENCOUNTER — Encounter: Payer: Self-pay | Admitting: Family

## 2017-10-20 ENCOUNTER — Ambulatory Visit: Payer: Medicare Other | Attending: Family | Admitting: Family

## 2017-10-20 VITALS — BP 122/83 | HR 100 | Resp 18 | Ht 62.0 in | Wt 134.2 lb

## 2017-10-20 DIAGNOSIS — Z885 Allergy status to narcotic agent status: Secondary | ICD-10-CM | POA: Insufficient documentation

## 2017-10-20 DIAGNOSIS — R42 Dizziness and giddiness: Secondary | ICD-10-CM | POA: Insufficient documentation

## 2017-10-20 DIAGNOSIS — M1990 Unspecified osteoarthritis, unspecified site: Secondary | ICD-10-CM | POA: Diagnosis not present

## 2017-10-20 DIAGNOSIS — Z23 Encounter for immunization: Secondary | ICD-10-CM | POA: Diagnosis not present

## 2017-10-20 DIAGNOSIS — L719 Rosacea, unspecified: Secondary | ICD-10-CM | POA: Insufficient documentation

## 2017-10-20 DIAGNOSIS — Z801 Family history of malignant neoplasm of trachea, bronchus and lung: Secondary | ICD-10-CM | POA: Insufficient documentation

## 2017-10-20 DIAGNOSIS — I10 Essential (primary) hypertension: Secondary | ICD-10-CM

## 2017-10-20 DIAGNOSIS — I5032 Chronic diastolic (congestive) heart failure: Secondary | ICD-10-CM | POA: Insufficient documentation

## 2017-10-20 DIAGNOSIS — F329 Major depressive disorder, single episode, unspecified: Secondary | ICD-10-CM | POA: Insufficient documentation

## 2017-10-20 DIAGNOSIS — Z79891 Long term (current) use of opiate analgesic: Secondary | ICD-10-CM | POA: Diagnosis not present

## 2017-10-20 DIAGNOSIS — M4802 Spinal stenosis, cervical region: Secondary | ICD-10-CM | POA: Diagnosis not present

## 2017-10-20 DIAGNOSIS — Z880 Allergy status to penicillin: Secondary | ICD-10-CM | POA: Insufficient documentation

## 2017-10-20 DIAGNOSIS — R0602 Shortness of breath: Secondary | ICD-10-CM | POA: Insufficient documentation

## 2017-10-20 DIAGNOSIS — Z85828 Personal history of other malignant neoplasm of skin: Secondary | ICD-10-CM | POA: Insufficient documentation

## 2017-10-20 DIAGNOSIS — Z833 Family history of diabetes mellitus: Secondary | ICD-10-CM | POA: Diagnosis not present

## 2017-10-20 DIAGNOSIS — Z7982 Long term (current) use of aspirin: Secondary | ICD-10-CM | POA: Insufficient documentation

## 2017-10-20 DIAGNOSIS — Z9842 Cataract extraction status, left eye: Secondary | ICD-10-CM | POA: Insufficient documentation

## 2017-10-20 DIAGNOSIS — Z8249 Family history of ischemic heart disease and other diseases of the circulatory system: Secondary | ICD-10-CM | POA: Diagnosis not present

## 2017-10-20 DIAGNOSIS — Z888 Allergy status to other drugs, medicaments and biological substances status: Secondary | ICD-10-CM | POA: Diagnosis not present

## 2017-10-20 DIAGNOSIS — I4891 Unspecified atrial fibrillation: Secondary | ICD-10-CM | POA: Insufficient documentation

## 2017-10-20 DIAGNOSIS — Z79899 Other long term (current) drug therapy: Secondary | ICD-10-CM | POA: Diagnosis not present

## 2017-10-20 DIAGNOSIS — I110 Hypertensive heart disease with heart failure: Secondary | ICD-10-CM | POA: Insufficient documentation

## 2017-10-20 DIAGNOSIS — Z9889 Other specified postprocedural states: Secondary | ICD-10-CM | POA: Diagnosis not present

## 2017-10-20 NOTE — Progress Notes (Signed)
Patient ID: Heather Barker, female    DOB: [birth year], 83 y.o.   MRN: [id number]  HPI  Heather Barker is a 83 y/o female with a history of cervical stenosis, osteoarthritis, HTN, depression, atrial fibrillation and chronic heart failure.   Echo done 05/11/17 reviewed and showed an EF of 65-70% along with trivial AR, severe MR/TR. Previous echo was done 12/05/16 and it showed an EF of 60-65% along with trivial AR, moderate MR and mod/severe TR.   Admitted 05/10/17 due to HF exacerbation. Initially needed bipap and then transitioned to nasal cannula. IV lasix initially along with antibiotics for bronchitis. Cardiology consult obtained. Discharged to [facility] after 5 days.  Admitted 03/04/17 with sepsis due to pneumonia. Begun on levaquin. Initially needed bipap but then transitioned to nasal cannula. Discharged to SNF.   She presents today for her follow-up visit with a chief complaint of moderate shortness of breath with minimal exertion. She describes this as chronic in nature having been present for several years with varying levels of severity. She has associated fatigue and light-headedness along with this. She denies any chest pain, edema or palpitations. She says that she's currently not being weighed daily.   Past Medical History:  Diagnosis Date  . Atrial fibrillation ([place])   . Basal cell cancer 1990's   forehead  . CHF (congestive heart failure) ([place])   . Depression   . Hypertension   . Osteoarthritis   . Psychosis   . Rosacea   . Stenosis, cervical spine   . Urinary incontinence    Past Surgical History:  Procedure Laterality Date  . APPENDECTOMY    . CATARACT EXTRACTION     left eye  . CATARACT EXTRACTION  07/2010   right, IOL  . DILATION AND CURETTAGE OF UTERUS  1970's  . KNEE ARTHROSCOPY  1996   right   . ROTATOR CUFF REPAIR     left torn   Family History  Problem Relation Age of Onset  . Cancer Mother        lung  . Hypertension Mother   . Heart disease  Father   . Hypertension Father   . Diabetes Neg Hx    Social History  Substance Use Topics  . Smoking status: Never Smoker  . Smokeless tobacco: Never Used  . Alcohol use Yes     Comment: occasional   Allergies  Allergen Reactions  . Codeine Phosphate     REACTION: intolerance--hydrocodone okay  . Meperidine Hcl     REACTION: unspecified  . Penicillins    Prior to Admission medications   Medication Sig Start Date End Date Taking? Authorizing Provider  acetaminophen (TYLENOL) 500 MG tablet Take 500 mg by mouth 2 (two) times daily.    Yes [provider]  alum & mag hydroxide-simeth (MAALOX/MYLANTA) 200-200-20 MG/5ML suspension Take 30 mLs by mouth every 8 (eight) hours as needed for indigestion or heartburn.   Yes [provider]  aspirin EC 81 MG tablet Take 81 mg by mouth daily.   Yes [provider]  Biotin 10 MG TABS Take 10 mg by mouth daily.   Yes [provider]  Cholecalciferol (VITAMIN D3) 50000 units TABS Take 1 tablet by mouth every 30 (thirty) days.   Yes [provider]  diltiazem (CARDIZEM CD) 120 MG 24 hr capsule Take 1 capsule (120 mg total) by mouth daily. 05/16/17  Yes Gladstone Lighter, MD  furosemide (LASIX) 20 MG tablet Take 1 tablet (20 mg total)  by mouth 2 (two) times daily. 05/15/17  Yes Gladstone Lighter, MD  ipratropium-albuterol (DUONEB) 0.5-2.5 (3) MG/3ML SOLN Take 3 mLs by nebulization every 6 (six) hours as needed. 01/11/17  Yes Gouru, Aruna, MD  lidocaine (LIDODERM) 5 % Place 1 patch onto the skin every 12 (twelve) hours. Remove & Discard patch within 12 hours or as directed by MD   Yes [provider]  meclizine (ANTIVERT) 12.5 MG tablet Take 1 tablet (12.5 mg total) by mouth 3 (three) times daily as needed for dizziness. At 0800, 1400, and 2000 05/15/17  Yes Gladstone Lighter, MD  Menthol, Topical Analgesic, (BIOFREEZE) 4 % GEL Apply 1 application topically 2 (two) times daily.   Yes [provider]  metroNIDAZOLE (METROGEL) 1 % gel Apply 1 application topically 2 (two) times daily as needed.    Yes [provider]  Multiple Vitamin (MULTIVITAMIN) tablet Take 1 tablet by mouth daily.   Yes [provider]  ondansetron (ZOFRAN) 4 MG tablet Take 4 mg by mouth every 4 (four) hours as needed for nausea or vomiting.   Yes [provider]  potassium chloride SA (K-DUR,KLOR-CON) 20 MEQ tablet Take 1 tablet (20 mEq total) by mouth daily. 05/15/17  Yes Gladstone Lighter, MD  QUEtiapine (SEROQUEL) 25 MG tablet Take 1 tablet (25 mg total) by mouth 2 (two) times daily. 12/06/16  Yes Vaughan Basta, MD  senna-docusate (SENOKOT-S) 8.6-50 MG tablet Take 1 tablet by mouth 2 (two) times daily as needed for mild constipation.   Yes [provider]  traMADol (ULTRAM) 50 MG tablet Take 1 tablet (50 mg total) by mouth every 6 (six) hours as needed for moderate pain or severe pain. 05/15/17  Yes Gladstone Lighter, MD   Review of Systems  Constitutional: Positive for fatigue. Negative for appetite change.  HENT: Negative for congestion, postnasal drip and sore throat.   Eyes: Negative.   Respiratory: Positive for shortness of breath (wearing oxygen at 2L). Negative for cough, chest tightness and wheezing.   Cardiovascular: Negative for chest pain, palpitations and leg swelling (wearing TED hose).  Gastrointestinal: Negative for abdominal distention and abdominal pain.  Endocrine: Negative.   Genitourinary: Negative.   Musculoskeletal: Positive for back pain. Negative for neck stiffness.  Skin: Negative.   Allergic/Immunologic: Negative.   Neurological: Positive for light-headedness. Negative for dizziness.  Hematological: Negative for adenopathy. Does not bruise/bleed easily.  Psychiatric/Behavioral: Negative for dysphoric mood, sleep disturbance (wearing oxygen at 2L around the clock) and suicidal ideas. The patient is not nervous/anxious.    Vitals:    10/20/17 1041  BP: 122/83  Pulse: 100  Resp: 18  SpO2: 97%  Weight: 134 lb 4 oz (60.9 kg)  Height: 5\' 2"  (1.575 m)   Wt Readings from Last 3 Encounters:  10/20/17 134 lb 4 oz (60.9 kg)  07/14/17 120 lb 4 oz (54.5 kg)  05/10/17 137 lb 12.6 oz (62.5 kg)    Lab Results  Component Value Date   CREATININE 0.62 05/15/2017   CREATININE 0.68 05/14/2017   CREATININE 0.76 05/13/2017    Physical Exam  Constitutional: She is oriented to person, place, and time. She appears well-developed and well-nourished.  HENT:  Head: Normocephalic and atraumatic.  Neck: Normal range of motion. Neck supple. No JVD present.  Cardiovascular: Normal rate and regular rhythm.   Pulmonary/Chest: Effort normal. She has no wheezes. She has no rales.  Abdominal: Soft. She exhibits no distension. There is no tenderness.  Musculoskeletal: She exhibits no edema or  tenderness.  Neurological: She is alert and oriented to person, place, and time.  Skin: Skin is warm and dry.  Psychiatric: She has a normal mood and affect. Her behavior is normal. Thought content normal.  Nursing note and vitals reviewed.    Assessment & Plan:  1: Chronic heart failure with preserved ejection fraction- - NYHA class III - euvolemic today - not being weighed daily so an order was written for her to be weighed daily so the staff can call for an overnight weight gain of >2 pounds or a weekly weight gain of >5 pounds - weight up but patient stated her most recent weight so doubt the accuracy. Says that she can't stand long enough to get weighed - not adding salt to her food - does have support socks on - wears oxygen at 2L around the clock - use to see Dr. Ubaldo Glassing but no longer does since she's at [facility] - patient reports receiving the flu vaccine for this season  2: HTN- - BP looks good today - sees Dr. Lovie Macadamia for her primary care needs while at [facility] and last saw him 07/21/17  SNF medication list was reviewed.  Return here  in 6 months or sooner for any questions/problems before then.

## 2017-10-20 NOTE — Patient Instructions (Signed)
Resume weighing daily and call for an overnight weight gain of > 2 pounds or a weekly weight gain of >5 pounds. 

## 2018-04-18 NOTE — Progress Notes (Deleted)
Patient ID: HOA BRIGGS, female    DOB: 1930/07/23, 84 y.o.   MRN: [id number]  HPI  Ms Ciolino is a 84 y/o female with a history of cervical stenosis, osteoarthritis, HTN, depression, atrial fibrillation and chronic heart failure.   Echo done 05/11/17 reviewed and showed an EF of 65-70% along with trivial AR, severe MR/TR. Previous echo was done 12/05/16 and it showed an EF of 60-65% along with trivial AR, moderate MR and mod/severe TR.   Has not been admitted or been in the ED in the last 6 months.   She presents today for her follow-up visit with a chief complaint of   Past Medical History:  Diagnosis Date  . Atrial fibrillation ([place])   . Basal cell cancer 1990's   forehead  . CHF (congestive heart failure) ([place])   . Depression   . Hypertension   . Osteoarthritis   . Psychosis ([place])   . Rosacea   . Stenosis, cervical spine   . Urinary incontinence    Past Surgical History:  Procedure Laterality Date  . APPENDECTOMY    . CATARACT EXTRACTION     left eye  . CATARACT EXTRACTION  07/2010   right, IOL  . DILATION AND CURETTAGE OF UTERUS  1970's  . KNEE ARTHROSCOPY  1996   right   . ROTATOR CUFF REPAIR     left torn   Family History  Problem Relation Age of Onset  . Cancer Mother        lung  . Hypertension Mother   . Heart disease Father   . Hypertension Father   . Diabetes Neg Hx    Social History   Tobacco Use  . Smoking status: Never Smoker  . Smokeless tobacco: Never Used  Substance Use Topics  . Alcohol use: Yes    Comment: occasional   Allergies  Allergen Reactions  . Codeine Phosphate     REACTION: intolerance--hydrocodone okay  . Meperidine Hcl     REACTION: unspecified  . Penicillins     Review of Systems  Constitutional: Positive for fatigue. Negative for appetite change.  HENT: Negative for congestion, postnasal drip and sore throat.   Eyes: Negative.   Respiratory: Positive for shortness of breath (wearing oxygen at 2L). Negative  for cough, chest tightness and wheezing.   Cardiovascular: Negative for chest pain, palpitations and leg swelling (wearing TED hose).  Gastrointestinal: Negative for abdominal distention and abdominal pain.  Endocrine: Negative.   Genitourinary: Negative.   Musculoskeletal: Positive for back pain. Negative for neck stiffness.  Skin: Negative.   Allergic/Immunologic: Negative.   Neurological: Positive for light-headedness. Negative for dizziness.  Hematological: Negative for adenopathy. Does not bruise/bleed easily.  Psychiatric/Behavioral: Negative for dysphoric mood, sleep disturbance (wearing oxygen at 2L around the clock) and suicidal ideas. The patient is not nervous/anxious.      Physical Exam  Constitutional: She is oriented to person, place, and time. She appears well-developed and well-nourished.  HENT:  Head: Normocephalic and atraumatic.  Neck: Normal range of motion. Neck supple. No JVD present.  Cardiovascular: Normal rate and regular rhythm.  Pulmonary/Chest: Effort normal. She has no wheezes. She has no rales.  Abdominal: Soft. She exhibits no distension. There is no tenderness.  Musculoskeletal: She exhibits no edema or tenderness.  Neurological: She is alert and oriented to person, place, and time.  Skin: Skin is warm and dry.  Psychiatric: She has a normal mood and affect. Her behavior is normal. Thought content normal.  Nursing note and vitals reviewed.    Assessment & Plan:  1: Chronic heart failure with preserved ejection fraction- - NYHA class III - euvolemic today - not being weighed daily so an order was written for her to be weighed daily so the staff can call for an overnight weight gain of >2 pounds or a weekly weight gain of >5 pounds - weight up but patient stated her most recent weight so doubt the accuracy. Says that she can't stand long enough to get weighed - not adding salt to her food - does have support socks on - wears oxygen at 2L around the  clock - use to see Dr. Ubaldo Glassing but no longer does since she's at [facility] - patient reports receiving the flu vaccine for this season  2: HTN- - BP looks good today - sees Dr. Lovie Macadamia for her primary care needs while at [facility] and last saw him 07/21/17 - BMP from 05/15/17 reviewed and showed sodium 137, potassium 4.6 and GFR >60  SNF medication list was reviewed.

## 2018-04-20 ENCOUNTER — Ambulatory Visit: Payer: Medicare Other | Admitting: Family

## 2018-04-25 NOTE — Progress Notes (Deleted)
Patient ID: Heather Barker, female    DOB: 1930/02/09, 84 y.o.   MRN: [id number]  HPI  Heather Barker is a 84 y/o female with a history of cervical stenosis, osteoarthritis, HTN, depression, atrial fibrillation and chronic heart failure.   Echo done 05/11/17 reviewed and showed an EF of 65-70% along with trivial AR, severe MR/TR. Previous echo was done 12/05/16 and it showed an EF of 60-65% along with trivial AR, moderate MR and mod/severe TR.   Has not been admitted or been in the ED in the last 6 months.   She presents today for her follow-up visit with a chief complaint of   Past Medical History:  Diagnosis Date  . Atrial fibrillation ([place])   . Basal cell cancer 1990's   forehead  . CHF (congestive heart failure) ([place])   . Depression   . Hypertension   . Osteoarthritis   . Psychosis ([place])   . Rosacea   . Stenosis, cervical spine   . Urinary incontinence    Past Surgical History:  Procedure Laterality Date  . APPENDECTOMY    . CATARACT EXTRACTION     left eye  . CATARACT EXTRACTION  07/2010   right, IOL  . DILATION AND CURETTAGE OF UTERUS  1970's  . KNEE ARTHROSCOPY  1996   right   . ROTATOR CUFF REPAIR     left torn   Family History  Problem Relation Age of Onset  . Cancer Mother        lung  . Hypertension Mother   . Heart disease Father   . Hypertension Father   . Diabetes Neg Hx    Social History   Tobacco Use  . Smoking status: Never Smoker  . Smokeless tobacco: Never Used  Substance Use Topics  . Alcohol use: Yes    Comment: occasional   Allergies  Allergen Reactions  . Codeine Phosphate     REACTION: intolerance--hydrocodone okay  . Meperidine Hcl     REACTION: unspecified  . Penicillins     Review of Systems  Constitutional: Positive for fatigue. Negative for appetite change.  HENT: Negative for congestion, postnasal drip and sore throat.   Eyes: Negative.   Respiratory: Positive for shortness of breath (wearing oxygen at 2L). Negative  for cough, chest tightness and wheezing.   Cardiovascular: Negative for chest pain, palpitations and leg swelling (wearing TED hose).  Gastrointestinal: Negative for abdominal distention and abdominal pain.  Endocrine: Negative.   Genitourinary: Negative.   Musculoskeletal: Positive for back pain. Negative for neck stiffness.  Skin: Negative.   Allergic/Immunologic: Negative.   Neurological: Positive for light-headedness. Negative for dizziness.  Hematological: Negative for adenopathy. Does not bruise/bleed easily.  Psychiatric/Behavioral: Negative for dysphoric mood, sleep disturbance (wearing oxygen at 2L around the clock) and suicidal ideas. The patient is not nervous/anxious.      Physical Exam  Constitutional: She is oriented to person, place, and time. She appears well-developed and well-nourished.  HENT:  Head: Normocephalic and atraumatic.  Neck: Normal range of motion. Neck supple. No JVD present.  Cardiovascular: Normal rate and regular rhythm.  Pulmonary/Chest: Effort normal. She has no wheezes. She has no rales.  Abdominal: Soft. She exhibits no distension. There is no tenderness.  Musculoskeletal: She exhibits no edema or tenderness.  Neurological: She is alert and oriented to person, place, and time.  Skin: Skin is warm and dry.  Psychiatric: She has a normal mood and affect. Her behavior is normal. Thought content normal.  Nursing note and vitals reviewed.    Assessment & Plan:  1: Chronic heart failure with preserved ejection fraction- - NYHA class III - euvolemic today - not being weighed daily so an order was written for her to be weighed daily so the staff can call for an overnight weight gain of >2 pounds or a weekly weight gain of >5 pounds - weight up but patient stated her most recent weight so doubt the accuracy. Says that she can't stand long enough to get weighed - not adding salt to her food - does have support socks on - wears oxygen at 2L around the  clock - use to see Heather Barker but no longer does since she's at [facility] -   2: HTN- - BP looks good today - sees Heather Barker for her primary care needs while at [facility] and last saw him 07/21/17 - BMP from 05/15/17 reviewed and showed sodium 137, potassium 4.6 and GFR >60  SNF medication list was reviewed.

## 2018-04-28 ENCOUNTER — Telehealth: Payer: Self-pay | Admitting: Family

## 2018-04-28 ENCOUNTER — Ambulatory Visit: Payer: Medicare Other | Admitting: Family

## 2018-04-28 NOTE — Telephone Encounter (Signed)
Patient did not show for her [facility] appointment on 04/28/18. Will attempt to reschedule.

## 2018-07-10 ENCOUNTER — Other Ambulatory Visit
Admission: RE | Admit: 2018-07-10 | Discharge: 2018-07-10 | Disposition: A | Discharge status: Home / Self Care | Payer: Medicare Other | Source: Other Acute Inpatient Hospital | Attending: Family Medicine | Admitting: Family Medicine

## 2018-07-10 DIAGNOSIS — N390 Urinary tract infection, site not specified: Secondary | ICD-10-CM | POA: Insufficient documentation

## 2018-07-10 LAB — URINALYSIS, COMPLETE (UACMP) WITH MICROSCOPIC
BILIRUBIN URINE: NEGATIVE
GLUCOSE, UA: NEGATIVE mg/dL
KETONES UR: 5 mg/dL — AB
NITRITE: NEGATIVE
PH: 7 (ref 5.0–8.0)
Protein, ur: NEGATIVE mg/dL
SPECIFIC GRAVITY, URINE: 1.003 — AB (ref 1.005–1.030)

## 2018-07-11 LAB — URINE CULTURE: Culture: NO GROWTH

## 2019-12-28 DEATH — deceased
